# Patient Record
Sex: Male | Born: 1940 | Race: White | Hispanic: No | Marital: Married | Smoking: Never smoker
Health system: Southern US, Community
[De-identification: ages and names within clinical notes are randomized; demographics above are authoritative.]

## PROBLEM LIST (undated history)

## (undated) DIAGNOSIS — N183 Chronic kidney disease, stage 3 unspecified: Secondary | ICD-10-CM

## (undated) DIAGNOSIS — I255 Ischemic cardiomyopathy: Secondary | ICD-10-CM

## (undated) DIAGNOSIS — I739 Peripheral vascular disease, unspecified: Secondary | ICD-10-CM

## (undated) DIAGNOSIS — I2510 Atherosclerotic heart disease of native coronary artery without angina pectoris: Secondary | ICD-10-CM

## (undated) DIAGNOSIS — Z8719 Personal history of other diseases of the digestive system: Secondary | ICD-10-CM

## (undated) DIAGNOSIS — I219 Acute myocardial infarction, unspecified: Secondary | ICD-10-CM

## (undated) DIAGNOSIS — I10 Essential (primary) hypertension: Secondary | ICD-10-CM

## (undated) DIAGNOSIS — I779 Disorder of arteries and arterioles, unspecified: Secondary | ICD-10-CM

## (undated) DIAGNOSIS — K2210 Ulcer of esophagus without bleeding: Secondary | ICD-10-CM

## (undated) DIAGNOSIS — Z9889 Other specified postprocedural states: Secondary | ICD-10-CM

## (undated) DIAGNOSIS — I5022 Chronic systolic (congestive) heart failure: Secondary | ICD-10-CM

## (undated) DIAGNOSIS — R112 Nausea with vomiting, unspecified: Secondary | ICD-10-CM

## (undated) DIAGNOSIS — D638 Anemia in other chronic diseases classified elsewhere: Secondary | ICD-10-CM

## (undated) DIAGNOSIS — E785 Hyperlipidemia, unspecified: Secondary | ICD-10-CM

## (undated) DIAGNOSIS — M1990 Unspecified osteoarthritis, unspecified site: Secondary | ICD-10-CM

## (undated) DIAGNOSIS — IMO0001 Reserved for inherently not codable concepts without codable children: Secondary | ICD-10-CM

## (undated) DIAGNOSIS — I701 Atherosclerosis of renal artery: Secondary | ICD-10-CM

## (undated) DIAGNOSIS — I4891 Unspecified atrial fibrillation: Secondary | ICD-10-CM

## (undated) DIAGNOSIS — G4730 Sleep apnea, unspecified: Secondary | ICD-10-CM

## (undated) DIAGNOSIS — J189 Pneumonia, unspecified organism: Secondary | ICD-10-CM

## (undated) DIAGNOSIS — L0390 Cellulitis, unspecified: Secondary | ICD-10-CM

## (undated) HISTORY — DX: Atherosclerotic heart disease of native coronary artery without angina pectoris: I25.10

## (undated) HISTORY — DX: Chronic systolic (congestive) heart failure: I50.22

## (undated) HISTORY — DX: Hyperlipidemia, unspecified: E78.5

## (undated) HISTORY — DX: Ulcer of esophagus without bleeding: K22.10

## (undated) HISTORY — DX: Unspecified atrial fibrillation: I48.91

## (undated) HISTORY — DX: Peripheral vascular disease, unspecified: I73.9

## (undated) HISTORY — PX: OTHER SURGICAL HISTORY: SHX169

## (undated) HISTORY — DX: Disorder of arteries and arterioles, unspecified: I77.9

## (undated) HISTORY — DX: Cellulitis, unspecified: L03.90

## (undated) HISTORY — DX: Chronic kidney disease, stage 3 (moderate): N18.3

## (undated) HISTORY — DX: Anemia in other chronic diseases classified elsewhere: D63.8

## (undated) HISTORY — DX: Chronic kidney disease, stage 3 unspecified: N18.30

## (undated) HISTORY — DX: Ischemic cardiomyopathy: I25.5

## (undated) HISTORY — PX: URETHRAL DILATION: SUR417

## (undated) HISTORY — DX: Atherosclerosis of renal artery: I70.1

---
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
## 1997-01-26 HISTORY — PX: CORONARY ARTERY BYPASS GRAFT: SHX141

## 1998-03-15 ENCOUNTER — Ambulatory Visit (HOSPITAL_COMMUNITY): Admission: RE | Admit: 1998-03-15 | Discharge: 1998-03-15 | Payer: Self-pay | Admitting: Gastroenterology

## 1999-06-01 ENCOUNTER — Ambulatory Visit (HOSPITAL_COMMUNITY): Admission: RE | Admit: 1999-06-01 | Discharge: 1999-06-01 | Payer: Self-pay | Admitting: Interventional Cardiology

## 1999-06-01 ENCOUNTER — Encounter: Payer: Self-pay | Admitting: Interventional Cardiology

## 2000-02-16 ENCOUNTER — Encounter: Payer: Self-pay | Admitting: Family Medicine

## 2000-02-16 ENCOUNTER — Encounter: Admission: RE | Admit: 2000-02-16 | Discharge: 2000-02-16 | Payer: Self-pay | Admitting: Family Medicine

## 2000-07-16 ENCOUNTER — Encounter: Admission: RE | Admit: 2000-07-16 | Discharge: 2000-08-15 | Payer: Self-pay | Admitting: Neurosurgery

## 2001-05-28 HISTORY — PX: ARTERIAL THROMBECTOMY: SHX558

## 2001-06-28 DIAGNOSIS — I219 Acute myocardial infarction, unspecified: Secondary | ICD-10-CM

## 2001-06-28 HISTORY — DX: Acute myocardial infarction, unspecified: I21.9

## 2001-07-09 ENCOUNTER — Encounter: Payer: Self-pay | Admitting: Interventional Cardiology

## 2001-07-09 ENCOUNTER — Inpatient Hospital Stay (HOSPITAL_COMMUNITY): Admission: EM | Admit: 2001-07-09 | Discharge: 2001-07-13 | Payer: Self-pay | Admitting: *Deleted

## 2001-07-09 ENCOUNTER — Encounter: Admission: RE | Admit: 2001-07-09 | Discharge: 2001-07-09 | Payer: Self-pay | Admitting: Interventional Cardiology

## 2001-07-29 ENCOUNTER — Encounter (HOSPITAL_COMMUNITY): Admission: RE | Admit: 2001-07-29 | Discharge: 2001-10-27 | Payer: Self-pay | Admitting: Interventional Cardiology

## 2001-10-28 ENCOUNTER — Inpatient Hospital Stay (HOSPITAL_COMMUNITY): Admission: EM | Admit: 2001-10-28 | Discharge: 2001-10-30 | Payer: Self-pay | Admitting: *Deleted

## 2002-07-26 ENCOUNTER — Encounter: Payer: Self-pay | Admitting: Family Medicine

## 2002-07-26 ENCOUNTER — Encounter: Admission: RE | Admit: 2002-07-26 | Discharge: 2002-07-26 | Payer: Self-pay | Admitting: Family Medicine

## 2004-12-05 ENCOUNTER — Encounter: Admission: RE | Admit: 2004-12-05 | Discharge: 2004-12-05 | Payer: Self-pay | Admitting: Nephrology

## 2005-04-25 ENCOUNTER — Encounter: Admission: RE | Admit: 2005-04-25 | Discharge: 2005-04-25 | Payer: Self-pay | Admitting: Nephrology

## 2005-06-08 ENCOUNTER — Ambulatory Visit (HOSPITAL_COMMUNITY): Admission: RE | Admit: 2005-06-08 | Discharge: 2005-06-08 | Payer: Self-pay | Admitting: Interventional Radiology

## 2005-06-19 ENCOUNTER — Encounter: Admission: RE | Admit: 2005-06-19 | Discharge: 2005-06-19 | Payer: Self-pay | Admitting: Interventional Radiology

## 2006-10-01 ENCOUNTER — Inpatient Hospital Stay (HOSPITAL_BASED_OUTPATIENT_CLINIC_OR_DEPARTMENT_OTHER): Admission: RE | Admit: 2006-10-01 | Discharge: 2006-10-01 | Payer: Self-pay | Admitting: Interventional Cardiology

## 2009-01-17 ENCOUNTER — Encounter: Admission: RE | Admit: 2009-01-17 | Discharge: 2009-01-17 | Payer: Self-pay | Admitting: Interventional Cardiology

## 2009-01-18 ENCOUNTER — Ambulatory Visit (HOSPITAL_COMMUNITY): Admission: RE | Admit: 2009-01-18 | Discharge: 2009-01-19 | Payer: Self-pay | Admitting: Interventional Cardiology

## 2010-01-13 ENCOUNTER — Encounter: Admission: RE | Admit: 2010-01-13 | Discharge: 2010-01-13 | Payer: Self-pay | Admitting: Family Medicine

## 2010-01-23 ENCOUNTER — Ambulatory Visit (HOSPITAL_COMMUNITY): Admission: RE | Admit: 2010-01-23 | Discharge: 2010-01-23 | Payer: Self-pay | Admitting: Family Medicine

## 2010-01-23 ENCOUNTER — Encounter: Payer: Self-pay | Admitting: Pulmonary Disease

## 2010-02-16 DIAGNOSIS — I10 Essential (primary) hypertension: Secondary | ICD-10-CM | POA: Insufficient documentation

## 2010-02-16 DIAGNOSIS — K2270 Barrett's esophagus without dysplasia: Secondary | ICD-10-CM | POA: Insufficient documentation

## 2010-02-16 DIAGNOSIS — M545 Low back pain: Secondary | ICD-10-CM

## 2010-02-16 DIAGNOSIS — I25110 Atherosclerotic heart disease of native coronary artery with unstable angina pectoris: Secondary | ICD-10-CM

## 2010-02-16 DIAGNOSIS — R0602 Shortness of breath: Secondary | ICD-10-CM | POA: Insufficient documentation

## 2010-02-16 DIAGNOSIS — E785 Hyperlipidemia, unspecified: Secondary | ICD-10-CM

## 2010-02-16 DIAGNOSIS — G609 Hereditary and idiopathic neuropathy, unspecified: Secondary | ICD-10-CM | POA: Insufficient documentation

## 2010-02-17 ENCOUNTER — Ambulatory Visit: Payer: Self-pay | Admitting: Pulmonary Disease

## 2010-02-20 ENCOUNTER — Ambulatory Visit (HOSPITAL_COMMUNITY)
Admission: RE | Admit: 2010-02-20 | Discharge: 2010-02-20 | Payer: Self-pay | Source: Home / Self Care | Admitting: Pulmonary Disease

## 2010-02-21 ENCOUNTER — Telehealth (INDEPENDENT_AMBULATORY_CARE_PROVIDER_SITE_OTHER): Payer: Self-pay | Admitting: *Deleted

## 2010-02-24 ENCOUNTER — Inpatient Hospital Stay (HOSPITAL_COMMUNITY)
Admission: EM | Admit: 2010-02-24 | Discharge: 2010-02-27 | Payer: Self-pay | Source: Home / Self Care | Admitting: Emergency Medicine

## 2010-02-24 ENCOUNTER — Ambulatory Visit: Payer: Self-pay | Admitting: Internal Medicine

## 2010-03-03 ENCOUNTER — Inpatient Hospital Stay (HOSPITAL_BASED_OUTPATIENT_CLINIC_OR_DEPARTMENT_OTHER): Admission: RE | Admit: 2010-03-03 | Discharge: 2010-03-03 | Payer: Self-pay | Admitting: Interventional Cardiology

## 2010-03-10 ENCOUNTER — Encounter: Admission: RE | Admit: 2010-03-10 | Discharge: 2010-03-10 | Payer: Self-pay | Admitting: Otolaryngology

## 2010-05-03 ENCOUNTER — Ambulatory Visit (HOSPITAL_COMMUNITY)
Admission: RE | Admit: 2010-05-03 | Discharge: 2010-05-03 | Payer: Self-pay | Source: Home / Self Care | Attending: Otolaryngology | Admitting: Otolaryngology

## 2010-06-29 NOTE — Assessment & Plan Note (Signed)
Summary: consult for episodic sob   Visit Type:  Initial Consult Copy to:  [facility] Primary Provider/Referring Provider:  Cam Hai  CC:  pulmonary consult.  History of Present Illness: The pt is a 69y/o male who I have been asked to see for episodic dyspnea.  The pt has had this issue for approx. 6-12 mos, but is unsure if it has been worsening.  He has intermittantly a sensation that he cannot get his breath, and has not been able to pinpoint any temporal relationship that may help explain.  It can occur at rest, with conversation, but does NOT occur with his vigorous exercise program.  He execises 3-5 times a week, and works hard on the treadmill without sob.  He states that he can walk miles outside without getting sob.  He describes these episodes as a feeling he cannot get his breath, but no smothering or choking.  He has a mild dry cough at times, but does not feel this is an issue.  He has no h/o childhood asthma, and denies pleuritic chest pain.  He does have a h/o CAD, and EF in 2010 was 45% with +WMA.  He has had recent pfts with no airflow obstruction, and lung volumes/dlco inaccurate due to air leaks.  He has not had a recent cxr.    Current Medications (verified): 1)  Doxazosin Mesylate 4 Mg Tabs (Doxazosin Mesylate) .Marland Kitchen.. 1 Tablet By Mouth Once Daily 2)  Metformin Hcl 1000 Mg Tabs (Metformin Hcl) .Marland Kitchen.. 1 Tablet Two Times A Day 3)  Lantus 100 Unit/ml Soln (Insulin Glargine) .... 26 Units Subq 4)  Crestor 5 Mg Tabs (Rosuvastatin Calcium) .Marland Kitchen.. 1 Tablet Once Daily 5)  Fish Oil 300 Mg Caps (Omega-3 Fatty Acids) .... Once Daily 6)  Niaspan 500 Mg Cr-Tabs (Niacin (Antihyperlipidemic)) .... Once Daily 7)  Cartia Xt 240 Mg Xr24h-Cap (Diltiazem Hcl Coated Beads) .... Once Daily 8)  Protonix 40 Mg Solr (Pantoprazole Sodium) .Marland Kitchen.. 1 Tab By Mouth 3 Times Per Week 9)  Ferrous Sulfate 325 (65 Fe) Mg Tabs (Ferrous Sulfate) .... Once Daily 10)  Tylenol Arthritis Pain 650 Mg Cr-Tabs  (Acetaminophen) .... 2 Tabs Two Times A Day As Needed 11)  Furosemide 20 Mg Tabs (Furosemide) .... Once Daily 12)  Effient 10 Mg Tabs (Prasugrel Hcl) .... Once Daily 13)  Xalatan 0.005 % Soln (Latanoprost) .Marland Kitchen.. 1 Drop Into Affected Eye Every Morning As Needed 14)  Optivar 0.05 % Soln (Azelastine Hcl) .Marland Kitchen.. 1 Drop Into Affected Eye As Needed 15)  Vitamin D 1000 Unit Tabs (Cholecalciferol) .Marland Kitchen.. 1 Tablet Once Daily 16)  Nitrostat 0.4 Mg Subl (Nitroglycerin) .... As Directed As Needed 17)  Aspirin 325 Mg Tabs (Aspirin) .Marland Kitchen.. 1 Tab Once Daily 18)  Lisinopril 20 Mg Tabs (Lisinopril) .Marland Kitchen.. 1 1/2 Tab Two Times A Day 19)  Nasacort Aq 55 Mcg/act Aers (Triamcinolone Acetonide) .... 2 Puffs in Each Nostrile As Needed 20)  Clobetasol Propionate 0.05 % Oint (Clobetasol Propionate) .Marland Kitchen.. 1 Application To The Affected Area As Needed 21)  Systane 0.4-0.3 % Soln (Polyethyl Glycol-Propyl Glycol) .Marland Kitchen.. 1 Drop Into Affected Eye As Needed  Allergies: 1)  ! Sulfa 2)  ! * Dye Contrast 3)  ! * Mercury 4)  ! Iodine 5)  ! * Plavix 6)  ! 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 MI--FATHER, BROTHER HTN--2 BROTHER DM--BROTHER ASTHMA--SISTER  Social History: Reviewed history from 02/16/2010 and no changes required. Patient never smoked.  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  The patient denies shortness of breath with activity, productive cough, coughing up blood, irregular heartbeats, indigestion, loss of appetite, weight change, abdominal pain, difficulty swallowing, sore throat, itching, ear ache, anxiety, depression, rash, change in color of mucus, and fever.    Vital Signs:  Patient profile:   70 year old male Height:      67 inches Weight:      157.25 pounds BMI:     24.72 O2 Sat:      98 % on Room air Temp:     98.2 degrees F oral Pulse rate:   70 / minute BP sitting:   136 / 68  (left arm) Cuff size:   regular  Vitals Entered By: Carver Fila (February 17, 2010 9:21 AM)  O2 Flow:  Room air CC: pulmonary consult Comments meds and allergies updated Phone number updated Mindy Silva  February 17, 2010 9:22 AM    Physical Exam  General:  wd male in nad  Eyes:  PERRLA and EOMI.   Nose:  patent without discharge, no purulence noted. Mouth:  clear, no exudates. Neck:  no jvd, tmg, LN Lungs:  totally clear to auscultation. Heart:  rrr, 2/6 sem Abdomen:  soft and nontender, bs+ Extremities:  1+ edema noted in ankles, no cyanosis  pulses intact distally Neurologic:  alert and oriented, moves all 4.   Impression & Recommendations:  Problem # 1:  DYSPNEA (ICD-786.05) the pt has atypical dyspnea that occurs at rest, does not bother him with significant physical exertion, and has no temporal relationship to anytihing that he can pinpoint.  His pfts show no obstruction, and his FV loop appears normal.  His lung volumes and DLCO are uninterpretable due to large leak...the numbers are way too high, and outside the range of accuracy.  His lungs are totally clear, and he has no history  that is suggestive of asthma.  I am unable to find a specific pulmonary cause for his episodic symptoms, but would like to do a regular cxr and v/q scan to exclude possible chronic thromboembolic disease.  If these are unremarkable, I suspect this is either due to his "perception of breathing" or possibly due to his underlying cardiac disease.  If he continues to have this issue over time, or if worsens, I would do cardiopulmonary exercise testing to see if this would clear up the etiology for his symptoms.  My only other thought here is whether this could have anything to do with his ACE.  He denies any choking or feeling like his airway closes up, but a trial off the drug for 4-8 weeks could be tried.    Medications Added to Medication List This Visit: 1)  Doxazosin Mesylate 4 Mg Tabs (Doxazosin mesylate) .Marland Kitchen.. 1 tablet by mouth once daily 2)  Metformin Hcl 1000 Mg Tabs (  Metformin hcl) .Marland Kitchen.. 1 tablet two times a day 3)  Lantus 100 Unit/ml Soln (Insulin glargine) .... 26 units subq 4)  Crestor 5 Mg Tabs (Rosuvastatin calcium) .Marland Kitchen.. 1 tablet once daily 5)  Fish Oil 300 Mg Caps (Omega-3 fatty acids) .... Once daily 6)  Niaspan 500 Mg Cr-tabs (Niacin (antihyperlipidemic)) .... Once daily 7)  Cartia Xt 240 Mg Xr24h-cap (Diltiazem hcl coated beads) .... Once daily 8)  Protonix 40 Mg Solr (Pantoprazole sodium) .Marland Kitchen.. 1 tab by mouth 3 times per week 9)  Ferrous Sulfate 325 (65 Fe) Mg Tabs (Ferrous sulfate) .... Once daily 10)  Tylenol Arthritis Pain 650 Mg Cr-tabs (Acetaminophen) .... 2 tabs two times a day as needed 11)  Furosemide 20 Mg Tabs (Furosemide) .... Once daily 12)  Effient 10 Mg Tabs (Prasugrel hcl) .... Once daily 13)  Xalatan 0.005 % Soln (Latanoprost) .Marland Kitchen.. 1 drop into affected eye every morning as needed 14)  Optivar 0.05 % Soln (Azelastine hcl) .Marland Kitchen.. 1 drop into affected eye as needed 15)  Vitamin D 1000 Unit Tabs (Cholecalciferol) .Marland Kitchen.. 1 tablet once daily 16)  Nitrostat 0.4 Mg Subl  (Nitroglycerin) .... As directed as needed 17)  Aspirin 325 Mg Tabs (Aspirin) .Marland Kitchen.. 1 tab once daily 18)  Lisinopril 20 Mg Tabs (Lisinopril) .Marland Kitchen.. 1 1/2 tab two times a day 19)  Nasacort Aq 55 Mcg/act Aers (Triamcinolone acetonide) .... 2 puffs in each nostrile as needed 20)  Clobetasol Propionate 0.05 % Oint (Clobetasol propionate) .Marland Kitchen.. 1 application to the affected area as needed 21)  Systane 0.4-0.3 % Soln (Polyethyl glycol-propyl glycol) .Marland Kitchen.. 1 drop into affected eye as needed  Other Orders: Consultation Level IV (16109) Radiology Referral (Radiology) T-2 View CXR (71020TC)  Patient Instructions: 1)  will check cxr today for completeness 2)  will schedule for lung scan to exclude small blood clots. 3)  if you continue to have issues over time, and especially if it worsens, please let me know.   Immunization History:  Influenza Immunization History:    Influenza:  historical (02/25/2009)

## 2010-06-29 NOTE — Progress Notes (Signed)
Summary: results of lung scan  Phone Note Call from Patient   Caller: Patient Call For: clance Summary of Call: calling for lab results Initial call taken by: Rickard Patience,  February 21, 2010 1:08 PM  Follow-up for Phone Call        called and spoke with pt. pt aware of lung scan.  see append to lung scan results for complete details.  Aundra Millet Reynolds LPN  February 21, 2010 1:51 PM

## 2010-08-07 LAB — BASIC METABOLIC PANEL
BUN: 17 mg/dL (ref 6–23)
CO2: 28 mEq/L (ref 19–32)
Calcium: 9.5 mg/dL (ref 8.4–10.5)
Chloride: 102 mEq/L (ref 96–112)
Creatinine, Ser: 1.4 mg/dL (ref 0.4–1.5)
GFR calc Af Amer: >60 mL/min (ref >60)
GFR calc non Af Amer: 51 mL/min — ABNORMAL LOW (ref >60)
Glucose, Bld: 290 mg/dL — ABNORMAL HIGH (ref 70–99)
Potassium: 4.2 mEq/L (ref 3.5–5.1)
Sodium: 137 mEq/L (ref 135–145)

## 2010-08-07 LAB — CBC
HCT: 37 % — ABNORMAL LOW (ref 39.0–52.0)
Hemoglobin: 11.9 g/dL — ABNORMAL LOW (ref 13.0–17.0)
MCH: 30 pg (ref 26.0–34.0)
MCHC: 32.2 g/dL (ref 30.0–36.0)
MCV: 93.2 fL (ref 78.0–100.0)
Platelets: 137 10*3/uL — ABNORMAL LOW (ref 150–400)
RBC: 3.97 MIL/uL — ABNORMAL LOW (ref 4.22–5.81)
RDW: 13.5 % (ref 11.5–15.5)
WBC: 3.7 10*3/uL — ABNORMAL LOW (ref 4.0–10.5)

## 2010-08-07 LAB — SURGICAL PCR SCREEN
MRSA, PCR: NEGATIVE
Staphylococcus aureus: NEGATIVE

## 2010-08-07 LAB — GLUCOSE, CAPILLARY
Glucose-Capillary: 109 mg/dL — ABNORMAL HIGH (ref 70–99)
Glucose-Capillary: 90 mg/dL (ref 70–99)

## 2010-08-07 LAB — PROTIME-INR
INR: 1.02 (ref 0.00–1.49)
Prothrombin Time: 13.6 seconds (ref 11.6–15.2)

## 2010-08-09 LAB — GLUCOSE, CAPILLARY
Glucose-Capillary: 111 mg/dL — ABNORMAL HIGH (ref 70–99)
Glucose-Capillary: 116 mg/dL — ABNORMAL HIGH (ref 70–99)
Glucose-Capillary: 121 mg/dL — ABNORMAL HIGH (ref 70–99)
Glucose-Capillary: 147 mg/dL — ABNORMAL HIGH (ref 70–99)
Glucose-Capillary: 148 mg/dL — ABNORMAL HIGH (ref 70–99)
Glucose-Capillary: 165 mg/dL — ABNORMAL HIGH (ref 70–99)
Glucose-Capillary: 167 mg/dL — ABNORMAL HIGH (ref 70–99)
Glucose-Capillary: 213 mg/dL — ABNORMAL HIGH (ref 70–99)
Glucose-Capillary: 216 mg/dL — ABNORMAL HIGH (ref 70–99)
Glucose-Capillary: 91 mg/dL (ref 70–99)

## 2010-08-09 LAB — TROPONIN I: Troponin I: 0.04 ng/mL (ref 0.00–0.06)

## 2010-08-09 LAB — CBC
HCT: 34.1 % — ABNORMAL LOW (ref 39.0–52.0)
HCT: 35.7 % — ABNORMAL LOW (ref 39.0–52.0)
Hemoglobin: 11.1 g/dL — ABNORMAL LOW (ref 13.0–17.0)
Hemoglobin: 11.8 g/dL — ABNORMAL LOW (ref 13.0–17.0)
MCH: 30.3 pg (ref 26.0–34.0)
MCH: 30.9 pg (ref 26.0–34.0)
MCHC: 32.6 g/dL (ref 30.0–36.0)
MCHC: 33.1 g/dL (ref 30.0–36.0)
MCV: 93.2 fL (ref 78.0–100.0)
MCV: 93.5 fL (ref 78.0–100.0)
Platelets: 105 K/uL — ABNORMAL LOW (ref 150–400)
Platelets: 114 K/uL — ABNORMAL LOW (ref 150–400)
RBC: 3.66 MIL/uL — ABNORMAL LOW (ref 4.22–5.81)
RBC: 3.82 MIL/uL — ABNORMAL LOW (ref 4.22–5.81)
RDW: 12.9 % (ref 11.5–15.5)
RDW: 12.9 % (ref 11.5–15.5)
WBC: 4.5 K/uL (ref 4.0–10.5)
WBC: 5 K/uL (ref 4.0–10.5)

## 2010-08-09 LAB — CK TOTAL AND CKMB (NOT AT ARMC)
CK, MB: 4.3 ng/mL — ABNORMAL HIGH (ref 0.3–4.0)
Relative Index: 2.8 — ABNORMAL HIGH (ref 0.0–2.5)
Total CK: 151 U/L (ref 7–232)

## 2010-08-09 LAB — POCT CARDIAC MARKERS
CKMB, poc: 1.4 ng/mL (ref 1.0–8.0)
Myoglobin, poc: 176 ng/mL (ref 12–200)
Troponin i, poc: <0.05 ng/mL (ref 0.00–0.09)

## 2010-08-09 LAB — CARDIAC PANEL(CRET KIN+CKTOT+MB+TROPI)
CK, MB: 3.4 ng/mL (ref 0.3–4.0)
CK, MB: 3.6 ng/mL (ref 0.3–4.0)
Relative Index: 2.6 — ABNORMAL HIGH (ref 0.0–2.5)
Relative Index: 2.9 — ABNORMAL HIGH (ref 0.0–2.5)
Total CK: 123 U/L (ref 7–232)
Total CK: 129 U/L (ref 7–232)
Troponin I: 0.03 ng/mL (ref 0.00–0.06)
Troponin I: 0.06 ng/mL (ref 0.00–0.06)

## 2010-08-09 LAB — PROTIME-INR
INR: 0.96 (ref 0.00–1.49)
Prothrombin Time: 13 seconds (ref 11.6–15.2)

## 2010-08-09 LAB — HEPARIN LEVEL (UNFRACTIONATED): Heparin Unfractionated: 0.68 IU/mL (ref 0.30–0.70)

## 2010-08-09 LAB — POCT I-STAT GLUCOSE
Glucose, Bld: 250 mg/dL — ABNORMAL HIGH (ref 70–99)
Operator id: 141321

## 2010-08-10 LAB — CBC
MCH: 30.4 pg (ref 26.0–34.0)
MCHC: 32.5 g/dL (ref 30.0–36.0)
Platelets: 139 10*3/uL — ABNORMAL LOW (ref 150–400)
RDW: 12.9 % (ref 11.5–15.5)

## 2010-08-10 LAB — POCT CARDIAC MARKERS: Myoglobin, poc: 173 ng/mL (ref 12–200)

## 2010-08-10 LAB — BASIC METABOLIC PANEL
BUN: 18 mg/dL (ref 6–23)
CO2: 28 mEq/L (ref 19–32)
Calcium: 9.9 mg/dL (ref 8.4–10.5)
Creatinine, Ser: 1.56 mg/dL — ABNORMAL HIGH (ref 0.4–1.5)
GFR calc non Af Amer: 45 mL/min — ABNORMAL LOW (ref >60)
Glucose, Bld: 163 mg/dL — ABNORMAL HIGH (ref 70–99)
Sodium: 142 mEq/L (ref 135–145)

## 2010-09-02 LAB — CBC
MCHC: 34.2 g/dL (ref 30.0–36.0)
MCV: 92.9 fL (ref 78.0–100.0)
Platelets: 108 10*3/uL — ABNORMAL LOW (ref 150–400)
RBC: 3.45 MIL/uL — ABNORMAL LOW (ref 4.22–5.81)
WBC: 7.5 10*3/uL (ref 4.0–10.5)

## 2010-09-02 LAB — BASIC METABOLIC PANEL
BUN: 22 mg/dL (ref 6–23)
CO2: 26 mEq/L (ref 19–32)
Calcium: 8.9 mg/dL (ref 8.4–10.5)
Chloride: 103 mEq/L (ref 96–112)
Creatinine, Ser: 1.51 mg/dL — ABNORMAL HIGH (ref 0.4–1.5)
GFR calc Af Amer: 56 mL/min — ABNORMAL LOW (ref >60)

## 2010-09-02 LAB — GLUCOSE, CAPILLARY
Glucose-Capillary: 258 mg/dL — ABNORMAL HIGH (ref 70–99)
Glucose-Capillary: 324 mg/dL — ABNORMAL HIGH (ref 70–99)
Glucose-Capillary: 333 mg/dL — ABNORMAL HIGH (ref 70–99)

## 2010-10-10 NOTE — Cardiovascular Report (Signed)
NAME:  Ethan Vargas, Ethan Vargas NO.:  [id number]   MEDICAL RECORD NO.:  [id number]          PATIENT TYPE:  INP   LOCATION:  2506                         FACILITY:  [facility]   PHYSICIAN:  Lyn Records, M.D.   DATE OF BIRTH:  Jun 25, 1940   DATE OF PROCEDURE:  01/18/2009  DATE OF DISCHARGE:                            CARDIAC CATHETERIZATION   CARDIAC CATHETERIZATION REPORT AND PERCUTANEOUS CORONARY INTERVENTION   INDICATIONS:  The patient has a history of coronary bypass grafting in  1998.  Acute inferior infarction with stenting of the saphenous vein  graft to the right coronary in 2003.  Subsequent recent chest discomfort  with a nonischemic Cardiolite approximately 4 weeks ago.  Prolonged  episode of pain 72 hours ago dictating that coronary angiography should  be performed.   PROCEDURES PERFORMED:  1. Left heart cath.  2. Selective coronary angio.  3. Left ventriculography.  4. Bypass graft angiography.  5. Left internal mammary artery graft angiography.  6. Drug-eluting stents saphenous vein graft to the diagonal #1.   DESCRIPTION OF PROCEDURE:  The patient was brought to the diagnostic  laboratory in the [facility].  A 5-French sheath was placed using  the modified Seldinger technique after 1 mg of Versed and 25 mcg of  fentanyl.  A 4-French A2 multipurpose catheter was then used for  hemodynamic recordings, left ventriculography by hand injection,  selective right coronary angiography, and saphenous vein graft  angiography.  A #5-French left Judkins catheter was used for left  coronary angiography.  We used a 5-French internal mammary catheter for  left internal mammary angiography and right coronary angiography.  An  abdominal aortogram was performed using a pigtail catheter, 5-French.   After reviewing the coronary anatomy, it was felt that PCI on the  diagonal saphenous vein graft was indicated.  Because of the location of  the stenoses, distal protection  was not possible.  I chose not to  attempt proximal protection with Proxis.   After discussing the potential risks, we proceeded.  We upgraded to a 6-  [place] sheath.  The patient was given a 0.75 mg/kg bolus of Angiomax and  started on 1.75 mg/kg/hour infusion.  ACT was documented to be greater  than 300 seconds.  He was given 60 mg of Effient orally.  There is a  possible allergy to PLAVIX.  We then performed PCI using a #1 left  Amplatz guide catheter, 6-French.  An Office manager was used to  cross the stenoses in the saphenous vein graft.  We predilated with a 2-  0 balloon the distal lesion in the saphenous vein graft.  We deployed an  18 mm long x 3.0 mm XIENCE stent and postdilated to 10 atmospheres.  Two  balloon inflations were performed.  We then direct stented the midbody  stenosis with a 3.0 x 12 mm XIENCE stent to 12 atmospheres.  We then  postdilated the distal stent with a 3.0 x 12 mm Glidden Quantum to 16  atmospheres.  We also postdilated the distal stent with a 12 mm long x  3.0 mm Moody  Quantum to 13 atmospheres.  We then used a 3.25 Wynne apex to 13  atmospheres in the midbody stent.  One inflation was performed.   After completion of dilatation, 100 mcg of verapamil and 200 mcg of  nitroglycerin was given.  Good flow was noted, 0% stenoses of both sites  were noted.   The arterial sheath was injected and was documented to be in good  position to allow Angio-Seal.  Angio-Seal was performed with good  hemostasis.   RESULTS:  1. Hemodynamic data:  A,  Aortic pressure 157/69.  B.  Left ventricular pressure 156/27.  1. Left ventriculography:  The inferior wall is severely      hypokinetic/akinetic.  EF is 45%.  2. Coronary angiography.      a.     Left main coronary:  Patent.      b.     Left anterior descending coronary:  Totally occluded after       the first septal perforator in the midvessel.      c.     Circumflex artery:  The major obtuse marginal is totally        occluded.  Left atrial recurrent branch and distal marginal       branches supply collaterals to the distal right.      d.     Right coronary:  Totally occluded proximally.  3. Bypass graft angiography.      a.     Saphenous vein graft to the PDA:  Totally occluded       proximally.      b.     Saphenous vein graft to the diagonal:  This graft contains a       95% to 99% stenosis in the distal body of the graft and there is       an eccentric relatively focal 90% midbody stenosis.      c.     Saphenous vein graft to the obtuse marginal:  Widely patent.  4. LIMA graft to the LAD:  Widely patent.  5. PCI:      a.     90% midbody stenosis reduced to 0% after stenting as       described above with XIENCE 3.0 postdilated to 3.25.      b.     Distal 99% stenosis reduced to 0% stenosis with a 3.0 x 18       XIENCE stent postdilated to 3.0 at 14 atmospheres.      c.     TIMI grade 3 flow was noted poststenting.   CONCLUSION:  1. Bypass graft failure with total occlusion of saphenous vein graft      to the right coronary, which is now collateralized from the left      territory and also from the saphenous vein graft to the circumflex.      There is also high-grade multi focal stenosis in the saphenous vein      graft to the diagonal.  2. Patent left internal mammary artery to the left anterior      descending.  3. Severe native vessel disease with total occlusion of the left      anterior descending, obtuse marginal #1, and right coronary.  4. Left ventricular dysfunction with inferior wall severe      hypokinesis/akinesis.  Ejection fraction 45%.  5. Successful stenting of the mid-body and distal saphenous vein graft      to the diagonal with drug-eluting XIENCE stents to 0%  stenosis and      TIMI grade 3 flow.   PLAN:  Aspirin and Effient.  Hopeful, discharge on January 19, 2009.     Lyn Records, M.D.  Electronically Signed    HWS/MEDQ  D:  01/18/2009  T:  01/18/2009  Job:   [id number]   cc:   Donia Guiles, M.D.

## 2010-10-10 NOTE — Discharge Summary (Signed)
NAME:  Ethan Vargas, Ethan Vargas NO.:  [id number]   MEDICAL RECORD NO.:  [id number]          PATIENT TYPE:  INP   LOCATION:  2506                         FACILITY:  [facility]   PHYSICIAN:  Lyn Records, M.D.   DATE OF BIRTH:  Jul 19, 1940   DATE OF ADMISSION:  01/18/2009  DATE OF DISCHARGE:  01/19/2009                               DISCHARGE SUMMARY   DISCHARGE DIAGNOSES:  1. Coronary artery disease with bypass graft failure.      a.     Drug-eluting stent implantation, saphenous vein graft to the       diagonal  2. Diabetes mellitus with transient poor blood glucose control due to      premedication with prednisone for CONTRAST allergy.  3. Hypertension.  4. Hyperlipidemia.   PROCEDURE PERFORMED:  Implantation of drug-eluting stents in the  saphenous vein graft to the diagonal on January 18, 2009.   DISCHARGE INSTRUCTIONS:  The patient is to refrain from heavy physical  activity for 4 days.  He may return to work on January 24, 2009.   MEDICATIONS AT DISCHARGE:  1. Protonix 40 mg per day.  2. Diltiazem XT 240 mg per day.  3. Aspirin coated 325 mg per day.  4. Niaspan SR 500 mg per day.  5. Crestor 5 mg daily.  6. Doxazosin 2 mg daily.  7. Lisinopril 30 mg b.i.d.  8. Furosemide 20 mg daily.  9. Effient 10 mg daily.  10.Nitroglycerin 0.4 mg sublingually p.r.n. chest pain.  11.The patient is to resume metformin/glyburide (500/2.5 mg) 2 tablets      twice daily on January 20, 2009.   CONDITION ON DISCHARGE:  Improved.  Office visit with Dr. Verdis Prime in  7-10 days.   HISTORY AND HOSPITAL COURSE:  The patient is 74.  He recently had a  Cardiolite study in response to recent chest pain.  A Cardiolite study  demonstrated a large region of diminished blood flow in the inferior  wall, but no noticeable ischemia.  Initial attempts at medical therapy  failed when 3 days prior to admission, he developed a prolonged episode  of chest discomfort requiring nitroglycerin for  relief.   He was admitted as an a.m. admission for catheterization and documented  to have total occlusion of saphenous vein graft to the right coronary  collateralized from the left coronary system and high-grade tandem  lesions in the saphenous vein graft to the diagonal.  These lesions were  successfully stented with a XIENCE drug-eluting stents without  complications.   The patient's creatinine on discharge is 1.6, and he is advised to hold  metformin for an additional 24-48 hours before resuming.  His condition  on discharge is improved.      Lyn Records, M.D.  Electronically Signed     HWS/MEDQ  D:  01/19/2009  T:  01/19/2009  Job:  [id number]   cc:   Donia Guiles, M.D.

## 2010-10-13 NOTE — Cardiovascular Report (Signed)
[place]  Patient:    Ethan Vargas, Ethan Vargas Visit Number: [id number] MRN: [id number]          Service Type: MED Location: CCUA 2923 01 Attending Physician:  Lyn Records. Iii Dictated by:   Darci Needle, M.D. Proc. Date: 07/10/01 Admit Date:  07/09/2001   CC:         Desma Maxim, M.D.  Cardiac Catheterization Lab  [facility]   Cardiac Catheterization  INDICATION:  Non-ST elevation myocardial infarction greater than 24 hours old. The patient has a history of coronary artery bypass grafting in 1998.  This study is being done to define anatomy and help guide therapy.  PROCEDURES PERFORMED: 1. Left heart catheterization. 2. Selective coronary angioplasty. 3. Left ventriculography. 4. Saphenous vein graft angiography. 5. Left internal mammary artery graft angiography. 6. Percutaneous coronary intervention on the saphenous vein graft to the    right coronary artery.  CARDIOLOGIST:  Darci Needle, M.D.  DESCRIPTION:  After informed consent, a 6-French sheath was placed in the right femoral artery using the modified Seldinger technique.  A 6-French A2 multipurpose catheter was used for hemodynamic recordings, left ventriculography, and selective right coronary angiography, saphenous vein graft angiography.  A #4 left Judkins 6-French catheter was used for left coronary angiography.  An internal mammary artery 6-French catheter was used for left internal mammary artery graft angiography.  After reviewing the cine angiograms, it was felt that, with distal collaterals to the right coronary territory, attempt should be made at recanalization of the saphenous vein graft to the right coronary.  We also felt that, since thrombus was visible in the proximal stent, that distal protection was essential.  We enrolled the patient in the registry for the filter wire.  We upgraded to a 7-French system and used a 7-French A1 side hold  guide catheter.  We were unable to cross the stenosis initially with BMW wire.  We then used a Cross-It 200 wire.  We were able to obtain distal wire position. We were unable to establish antegrade flow until after predilating with the 2 mm x 20 mm long Maverick balloon.  Two balloon inflations were performed. We established TIMI-2 flow.  We did make the determination that it would be feasible to place a filter wire distally.  With some difficulty, we were able to place a filter wire distally beyond what appeared to be the diseased segment.  After the filter wire was placed, distal perfusion and visualization was lost.  We then performed angioplasty using a 3.0 mm diameter, 20 mm long Maverick balloon.  This helped to reestablish TIMI-2 flow.  We noted that we had good distal filter wire basket position.  We then deployed a 3.5 mm diameter, 38 mm long Zeta stent distally and, in an overlapping fashion, a 23 mm long 3.5 mm diameter Zeta stent more proximally.  After stent deployment, there was no visualization.  We performed aspiration using a tracker catheter.  This did not recover much plaque.  With TIMI-0 flow even after attempting to aspirate, we decided to retrieve the basket, hoping that absence of flow was related to clot-laden basket.  Following removal of the basket/filter wire, TIMI-2-3 flow was reestablished; however, the very first injection was noted to be associated with movement of a very large thrombus down through the stented region and into the distal vessel.  This was not associated with chest discomfort or hemodynamic perturbation.  We then placed a wire distally  and attempted, using a transport catheter, to aspirate thrombus distally in the mid vessel and also proximally.  The following angiographic result was less than optimal with a mid intervention region of narrowing of 50% that is felt to be related to persistent thrombus. There was thrombus proximal to the stent  and also thrombus in the distal vessel beyond the stent deployment site.  The patient received a total of 3500 units of heparin during the procedure. He was given a double bolus followed by an infusion of Integrilin.  Initial ACT was noted to be 224 seconds.  An ACT during the procedure had dipped down as low as 170 seconds.  He received supplemental heparin at that time.  He was given an additional 1500 units of heparin at the completion of the procedure when the ACT was noted to be 189 seconds.  The patient did not experience hemodynamic disturbance during the procedure, although he did experience chest discomfort in the very beginning of the procedure when we were attempting to get our initial wire across the lesion. This was also associated with A-V block requiring IV atropine.  After reestablishing a heart rate in the 80 to 100 range, the patients chest discomfort resolved, and he was stable throughout the remainder of the procedure.  RESULTS: I.   Hemodynamic data:      a. Aortic pressure 140/78 mmHg.      b. Left ventricular pressure 140/15 mmHg. II.  Left ventriculography:  The inferobasal wall is akinetic.  EF is 35      to 45%, no MR. III. Selective coronary angiography.      a. Left main coronary:  Left main coronary artery contains a 30 to 40%         proximal stenosis.      b. Left anterior descending coronary: The LAD is totally occluded up to a         large first septal perforator.  There is 95% stenosis proximal to the         septal perforators.      c. Circumflex artery: The circumflex artery is patent.  Two large obtuse         marginal branches, however, are totally occluded and fill by         saphenous vein grafts.      d. Right coronary artery: Totally occluded in the mid vessel and         diffusely diseased throughout the entire proximal segment.  IV.  Bypass graft angiography.      a. Saphenous vein graft to the right coronary artery: Totally          occluded in the mid vessel with visible thrombus near the site of         total occlusion.      b. Saphenous vein graft sequential to the distal circumflex and obtuse         marginal: Widely patent with only minimal irregularities.      c. Saphenous vein graft to the diagonal: Widely patent.  Distal luminal         irregularities with up to 30 to 40% narrowing.  The diagonal distal to         the graft insertion site contains 70% stenosis. V.   LIMA to the LAD: Widely patent. VI.  Percutaneous coronary intervention: This was a very long and complex      procedure lasting longer than 3-1/2 hours. Were were able to reestablish  antegrade flow; however, there was evidence of distal embolization and      also suboptimal angiographic result within the deployed stents and also      thrombus noted proximal to the deployed stent region.  I decided to      terminate the case after significant contrast load that is estimated to      be 550 cc of contrast.  The patient was stable at the completion of the      procedure with no chest discomfort or obvious ischemic EKG changes.  ASSESSMENT: 1. 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  The distal bed is perfused by    collaterals. 2. Recanalization of saphenous vein graft to the right coronary with    suboptimal angiographic final result with evidence of distal thrombus    embolization as well as thrombus within the stented region and proximally.    Outlook for long-term patency in this vessel is felt to be very low. 3. Significant native vessel disease with total occlusion of the right    coronary artery in the mid vessel, total occlusion of the left anterior    descending artery, total occlusion of two obtuse marginal branches. 4. Saphenous vein graft to the diagonal, to the circumflex/obtuse marginal,    and the left internal mammary artery to the left anterior descending  artery    are widely patent.  There is some distal disease in the saphenous vein    graft to the diagonal. 5. Left ventricular dysfunction with inferior akinesis.  PLAN: 1. IV Integrilin. 2. IV nitroglycerin. 3. Watch for arrhythmias. 4. Sheath removal later today. 5. Medical management. Dictated by:   Darci Needle, M.D. Attending Physician:  Lyn Records. Iii DD:  07/10/01 TD:  07/10/01 Job: [id number]

## 2010-10-13 NOTE — H&P (Signed)
[place]. [facility]  Patient:    Ethan Vargas, Ethan Vargas Visit Number: [id number] MRN: [id number]          Service Type: MED Location: 2000 2039 01 Attending Physician:  Lyn Records. Iii Dictated by:   Anselm Lis, N.P. Admit Date:  07/09/2001 Discharge Date: 07/13/2001   CC:         Desma Maxim, M.D.   History and Physical  DATE OF BIRTH: May 05, 1941  PRIMARY CARE Chrishaun Sasso: Desma Maxim, M.D.  ADMISSION DIAGNOSES:  1. Coronary atherosclerotic heart disease.     a. Status post recent prehospital myocardial infarction (probably        inferior by location of electrocardiogram changes) in this        70 year old male who is status post coronary artery bypass grafting        x5 five years earlier.  He is currently pain-free.     b. September 1998, coronary artery bypass grafting x5 with left        internal mammary artery to the left anterior descending, saphenous vein        graft to the diagonal, sequential saphenous vein graft to second obtuse        marginal, and distal circumflex.  Prior cardiac catheterization        revealed ejection fraction of 55% with inferior wall hypokinesis,        negative mitral regurgitation.     c. January 2003, graded exercise test in office which was normal.  2. History of hypertension for greater than 20 years.     a. January 2001 he had an MRA revealing 60% right renal artery stenosis,        left okay.  3. History of hiatal hernia/gastroesophageal reflux disease.  4. Diabetes mellitus diagnosed eight years earlier.  5. History of psoriatic arthritis.  6. Hypercholesterolemia.  7. Right knee surgery.  8. History of urethral stricture.  9. Glaucoma. 10. DYE allergy.  PLAN:  1. Admit to [employer] on telemetry.  2. IV heparin, IV nitrates.  3. Serial cardiac enzymes.  4. Add aspirin to regimen.  5. Premedicate with steroids (prednisone 60 mg 18 hours before and two     hours  before procedure, with 100 mg intravenous Solu-Medrol on call     to cardiac catheterization) in view of DYE allergy.  HISTORY OF PRESENT ILLNESS: Mr. Fortin of a very pleasant 71 year old diabetic male, who is status post CABG x5 five years earlier, history of hypertension, dyslipidemia; he is status post normal GXT in our clinic in January 2003.  Two days earlier he developed severe epigastric/anterior chest fullness, with sour taste, lasting approximately 20 minutes.  He had residual malaise.  He thought this was related to his GERD and hiatal hernia.  He took newly prescribed PPI.  His wife insisted on follow-up with primary cardiologist so he presented to clinic today, where EKG revealed Q waves inferiorly suspicious of "old" inferior MI with lateral ST-T wave abnormalities.  He was discomfort- free at the time of evaluation in the clinic.  He was given aspirin 325 mg, prednisone 60 mg and asked to report to [facility] Emergency Room.  Troponin I in clinic is 11.62.  ALLERGIES:  1. MERCURY.  2. IODINE.  MEDICATIONS:  1. Teveten 600 mg p.o. q.a.m.  2. Zocor 10 mg p.o. q.p.m.  3. Norvasc 5 mg p.o. q.h.s.  4. Zetia 10 mg  p.o. q.a.m.  5. Glyburide 5 mg 1/2 tablet p.o. q.d.  6. Protonix 40 mg p.o. q.a.m. (new x1 day).  7. Voltaren 25 mg two tablets p.o. q.d.  8. Androgel applied topically to bilateral upper arms b.i.d.  SOCIAL HISTORY: Tobacco/EtOH, negative.  FAMILY HISTORY: Father positive for CAD.  REVIEW OF SYSTEMS: As in HPI/Past Medical History.  Otherwise, denies problems with light headedness, dizziness, syncope, or near syncopal episodes.  Denies orthopnea, PND, DOE, or pedal edema.  Problems with reflux/hiatal hernia. Does have urethral stricture.  Generalized stiffness of joints.  Wears corrective lenses.  Occasional dry skin with cracking.  Denies problems with sleep and rest.  PHYSICAL EXAMINATION:  VITAL SIGNS: Blood pressure 138/80,  heart rate 60 and regular.  Weight 165 pounds.  Height 6 feet 9 inches.  GENERAL: He is a well-nourished, pleasant, conversant 70 year old gentleman in no apparent distress.  He denies discomfort.  His concerned wife is in attendance.  HEENT/NECK: Brisk bilateral carotid upstrokes without bruits.  Neck with no JVD, no thyromegaly.  CHEST:  Lung sounds clear with equal bilateral excursion.  Negative CVA tenderness.  CARDIAC: Regular rate and rhythm without murmurs, rubs, or gallops.  Normal S1 and S2.  VASCULAR: Negative pedal edema.  Distal pulses intact.  NEUROLOGIC: Cranial nerves II-XII grossly intact.  Alert and oriented x3.  GU/RECTAL: Deferred.  LABORATORY DATA: Data from May 08, 2001 - sodium 139, K 5.3, chloride 103, CO2 32, BUN 20, creatinine 1.5, glucose 144, calcium 9.7, SGOT elevated at 183, SGPT elevated at 89.  Coags within normal range.  Hemoglobin 14.6, WBC 7.8, platelets 190,000.  EKG today in clinic revealed sinus bradycardia at 49 beats per minute with "old" inferior MI with large Q waves in lead III and small Q waves in II and aVF; slight ST depression/downsloping with T wave abnormality in I, aVL, and V4 through V6.  Chest x-ray in clinica today was negative for CHF, no active disease. Dictated by:   Anselm Lis, N.P. Attending Physician:  Lyn Records. Iii DD:  08/13/01 TD:  08/14/01 Job: [id number]

## 2010-10-13 NOTE — Cardiovascular Report (Signed)
NAME:  Ethan Vargas, Ethan Vargas NO.:  [id number]   MEDICAL RECORD NO.:  [id number]          PATIENT TYPE:  OIB   LOCATION:  NA                           FACILITY:  [facility]   PHYSICIAN:  Lyn Records, M.D.   DATE OF BIRTH:  15-Feb-1941   DATE OF PROCEDURE:  10/01/2006  DATE OF DISCHARGE:                            CARDIAC CATHETERIZATION   INDICATION:  Mr. Greth has a history of complicated cardiovascular  disease with coronary artery disease as the primary problem.  He  underwent coronary artery bypass grafting in 1998.  He has some  sequelae, suffered an acute inferior infarction in 2003 and underwent  emergency saphenous vein graft intervention with multiple stents placed  in the saphenous vein graft to the right coronary.  He is known to have  moderate reduction in LV systolic function.  He has had vague recent  symptoms. A Cardiolite study performed less than a year ago demonstrated  a relatively fixed inferior wall defect with peri-infarct ischemia.  No  significant change in perfusion was noted when compared to a study from  2005.  With vague recent symptoms, we have decided to perform coronary  angiography to rule out right bypass graft restenosis/progression of  disease.   PROCEDURE PERFORMED:  1. Left heart cath.  2. Selective coronary angiography.  3. Left ventriculography.  4. Saphenous vein graft angiography.  5. Internal mammary graft angiography.  6. Abdominal aortography to rule out abdominal aortic aneurysm and to      document patency of right renal artery stent.  7. Internal mammary artery angiography.   DESCRIPTION:  After informed consent, a 4 French sheath was placed in  the right femoral artery using the modified Seldinger technique.  A 4  French A tube multi-purpose catheter was used for hemodynamic  recordings, left ventriculography by hand injection, selective bypass  graft angiography and native right coronary angiography.  We used a #4  4  French left diagnostic catheter for the left coronary angiography and an  internal mammary artery catheter 4 [place] was used for internal mammary  angiography.  Following bypass graft angiography, we performed an  abdominal aortography using a 4 French pigtail catheter positioned above  the renal arteries.  The study was done at 16 mL per second for a total  of 35 mL.  No complications occurred, and the procedure was terminated.   RESULTS:  1. Hemodynamic data:      a.     Aortic pressure 149/68.      b.     Left ventricular pressure 149/13.  2. Left ventriculography:  The left ventricle demonstrates normal      overall contractility of the anterior wall.  There is diminished      inferobasal contractility and inferobasal aneurysm formation.      Overall, ejection fraction, however, is estimated to be at least      40%.  No mitral regurgitation was noted.  3. Coronary angiography.      a.     Left main coronary artery:  Widely patent with perhaps 25%  proximal narrowing.  Moderate calcification was noted in the left       main.      b.     Left anterior descending coronary:  The LAD is totally       occluded after the second __________ appropriate in the mid       vessel.  There is 60-70% stenosis noted beyond the first of the       perforator.      c.     Circumflex artery:  The circumflex coronary artery contains       high-grade proximal stenosis.  The distal branches are occluded.      d.     Right coronary:  The right coronary artery is totally       occluded proximal and gives rise to right collaterals.  4. Bypass graft angiography:      a.     Saphenous vein graft to the right coronary/PDA:  This graft       is widely patent.  The previously placed overlapping __________       stents in the mid and proximal saphenous vein graft are widely       patent.  The PDA retrogradely fills on the continuation of the       right coronary and no high-grade obstruction was seen.       b.     Saphenous vein graft to the diagonal:  This graft is widely       patent.  There is 40% stenosis in the distal vein graft before the       insertion into the native diagonal.      c.     Saphenous vein graft to the circumflex:  This graft is       widely patent.  It supplies a trifurcating left lateral wall       circumflex branch.   IV:  Left internal mammary graft to the LAD:  This graft is widely  patent.  It is very tortuous.  No obstruction is felt to be present in  the ostium or body of the graft.  It ends on the mid LAD, and the LAD is  widely patent beyond the graft insertion site.   V:  Abdominal aortography:  Abdominal aortography does not reveal any  evidence for abdominal aortic aneurysm.  This was the indication for the  study.  Contrast flow was suboptimal but definitive evaluation of the  right renal artery stent, although this stent is obviously patent and  appears to be widely patent without definite significant regions of  restenosis.   CONCLUSIONS:  1. Widely patent saphenous vein grafts including the previously      stented saphenous vein graft to the right coronary.  2. Widely patent LIMA to the LAD.  3. Severe native vessel coronary disease with totally occluded native      right coronary, totally occluded LAD, and totally occluded obtuse      marginals from the circumflex.  4. Left ventricular dysfunction with inferobasal aneurysm.  Left      ventricular EF is estimated to be 40%.  No mitral regurgitation is      noted.  5. Abdominal aortography demonstrates no evidence of the aortic      aneurysm, likely patency of the stent to the right kidney within      the right renal artery.   PLAN:  1. Aggressive risk factor modification including aggressive diabetes  control.  2. Resumption of iron therapy and possibly reconsideration of      gastrointestinal workup to rule out sources of blood loss. 3. I encouraged the patient to become a little  more compliant,      especially with his diabetes control.      Lyn Records, M.D.     HWS/MEDQ  D:  10/01/2006  T:  10/01/2006  Job:  [id number]   cc:   Donia Guiles, M.D.

## 2010-10-13 NOTE — H&P (Signed)
[place]. [facility]  Patient:    COPE, MARTE Visit Number: [id number] MRN: [id number]          Service Type: MED Location: 2000 2004 01 Attending Physician:  Marnee Guarneri Dictated by:   Colleen Can. Deborah Chalk, M.D. Admit Date:  10/28/2001   CC:         Darci Needle, M.D.  Desma Maxim, M.D.   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 He is married.  He has two sons.  No history of alcohol or cigarettes.  HISTORY OF PRESENT ILLNESS:  At approximately 7:15 p.m. tonight, he developed the onset of substernal chest pain that was anterior, radiated to his left chest and very similar to his previous chest pain in February of 2003.  He was eating.  He took a total of three nitroglycerin without relief in the emergency room.  Gradually, the pain moved to his neck and he had a resultant fullness in his throat but gradually disappeared.  He had a history of a myocardial infarction with stents to the saphenous vein graft to his right coronary artery in February of 2003.  He has a history of coronary artery bypass grafting in 1998 with LIMA to the LAD, a saphenous vein graft to the diagonal, saphenous vein graft to the obtuse marginal and distal left circumflex and a saphenous vein graft to the right coronary artery.  He also developed hiccups with some nausea and burping and that was similar to his previous symptoms with his previous myocardial infarction.  He did not have any vomiting.  He does have a history of gastroesophageal reflux disease.  He recently finished cardiac rehab.  ALLERGIES:  He has a CONTRAST DYE allergy.  He had hives and edema but was not really short of breath.  He also has been allergic to IODINE and PLAVIX; PLAVIX causes a rash, IODINE causes pruritus.  PENICILLIN also causes pruritus.  CURRENT MEDICINES:  1. Sublingual nitroglycerin.  2. Aspirin.  3.  Teveten 600 mg p.o. q.d.  4. Zocor 10 mg per day.  5. Norvasc 5 mg p.o. q.d.  6. Zetia 10 mg p.o. q.d.  7. Glyburide 5 mg a half a tablet p.o. q.d.  8. Protonix.  9. Voltaren. 10. AndroGel applied to upper arms b.i.d. as before. 11. Toprol-XL 25 mg a day.  PAST MEDICAL HISTORY:  His other medical problems include a history of diabetes as well as history of mild renal insufficiency in the past.  He has had hypertension for over 25 years and has a history of gastroesophageal reflux disease.  FAMILY HISTORY:  Positive for coronary disease in his father.  REVIEW OF SYSTEMS:  As noted above.  He has had a history of a urethral stricture, some stiffness of his joints, he wears corrective lenses, occasional dry skin.  No GI or GU symptoms other than noted.  PHYSICAL EXAMINATION:  VITAL SIGNS:  His heart rate is 54.  Blood pressure is 137/77.  Respiratory rate is 24.  GENERAL:  He is a somewhat pleasant, pale white male.  HEENT:  Negative.  LUNGS:  Clear.  NECK:  No jugular venous distention.  No masses in his neck.  HEART:  Regular rate and rhythm.  ABDOMEN:  Soft and nontender.  EXTREMITIES:  Good pedal pulses.  There are no femoral artery bruits.  LABORATORY AND ACCESSORY DATA:  EKG showed inferior Q waves, normal sinus rhythm, nonspecific ST  and T wave changes.  Chest x-ray shows previous coronary artery bypass grafting and cardiomegaly.  OVERALL IMPRESSION:  1. Chest pain, compatible with angina and similar coronary pain.  2. Status post previous percutaneous coronary intervention and a saphenous     vein graft to the right coronary artery in February of 2003.  3. Status post coronary artery bypass grafting x5.  4. Allergies to iodine, dye contrast and Plavix.  5. Adult-onset diabetes mellitus.  6. History of hypertension.  PLAN:  We will admit.  Will rule out myocardial infarction.  We will give him pre-catheterization medicines including prednisone and Benadryl  as pre-catheterization in consideration of possible catheterization tomorrow. Dictated by:   Colleen Can Deborah Chalk, M.D. Attending Physician:  Marnee Guarneri DD:  10/28/01 TD:  10/30/01 Job: [id number]

## 2010-10-13 NOTE — Cardiovascular Report (Signed)
[place]. [facility]  Patient:    Ethan Vargas, Ethan Vargas Visit Number: [id number] MRN: [id number]          Service Type: MED Location: 2000 2004 01 Attending Physician:  Lyn Records. Iii Dictated by:   Armanda Magic, M.D. Proc. Date: 10/30/01 Admit Date:  10/28/2001 Discharge Date: 10/30/2001   CC:         Desma Maxim, M.D.  Darci Needle, M.D.   Cardiac Catheterization  REFERRING PHYSICIAN: Darci Needle, M.D.  PRIMARY CARE PHYSICIAN: Desma Maxim, M.D.  HISTORY OF PRESENT ILLNESS: This is a 70 year old white male with a history of coronary artery disease, status post coronary artery bypass grafting who recently underwent PTCA stenting of the saphenous vein graft to the right coronary artery in February of 2003. He now presents back with recurrent chest pain. The patient did rule out for myocardial infarction.  PROCEDURES: Left heart catheterization, coronary angiography, left ventriculography, saphenous vein graft, and left internal mammary artery angiography.  OPERATOR: Armanda Magic, M.D.  INDICATIONS: Chest pain, coronary artery disease.  COMPLICATIONS: None.  INTERVENOUS ACCESS: Via right femoral artery 6 French sheath.  DESCRIPTION OF PROCEDURE: The patient is brought to the cardiac catheterization laboratory in a fasting, nonsedated state.  Informed consent was obtained.  The patient was connected to continuous heart rate and pulse oximetry monitoring, and intermittent blood pressure monitoring. The right groin was prepped and draped in a sterile fashion.  Lidocaine 1% was used for local anesthesia.  Using the modified Seldinger technique, a 6 French sheath was placed in the right femoral artery.  Under fluoroscopic guidance, a 6 French Judkins JL4 catheter was placed in the left coronary artery. Multiple cine films were taken in the 30-degree RAO, 40-degree LAO views. This catheter was then exchanged out over a guide  wire for a 6 [place] JR4 catheter, which was placed under fluoroscopic guidance to the right coronary artery. Multiple cine films were taken in a 30-degree RAO, 40-degree LAO views. The catheter was then pulled back and engaged in the saphenous vein graft to the diagonal with multiple cine films taken in a 30-degree RAO, 40-degree LAO views. The catheter was then pulled back into the saphenous vein graft to the distal left circumflex, OM sequential graft, and multiple films were taken in a 30-degree RAO, 40-degree LAO views. The catheter was then attempted to engage the saphenous vein graft to the right coronary artery, but unsuccessful. This catheter was then exchanged over a guide wire for a 6 French RCB catheter, which successfully engaged the ostium of the vein graft to the right coronary artery, and multiple cine films were taken in the 30-degree RAO, 40-degree LAO views. This catheter was then exchanged out over a guide wire for a 6 [place] LIMA catheter, which was placed over the guide wire and placed into the left subclavian vein and then into the ostium of the left internal mammary artery. Angiography was performed in a 30-degree RAO, 40-degree LAO views. This catheter was then exchanged out over a guide wire for a 6 French angled pigtail catheter, which was placed in the left ventricular cavity. Left ventriculography was performed in the 30-degree RAO view using a total of 30 cc of contrast at 13 cc/sec. The catheter was then pulled back across the aortic valve with no significant pressure gradient. At the end of the procedure, all catheters and sheaths were removed. Manual compression was performed until adequate hemostasis was obtained. The patient was  transferred back to the room in stable condition.  RESULTS: 1. The left main coronary artery had a 30-40% proximal stenosis. 2. The LAD had a 99% proximal stenosis at the first septal perforator    takeoff and then was  occluded. 3. The left circumflex was a 50-60% mid stenosis with both OM-1 and OM-2    filling by saphenous vein graft. 4. The right coronary artery is diffusely diseased up to 70-80% proximally    and then occluded in the mid vessel. 5. Saphenous vein graft to the right coronary artery is widely patent.    The stent with luminal irregularities up to 20%. 6. Saphenous vein graft to the sequential distal left circumflex and OM was    widely patent and the saphenous vein graft to the diagonal is widely    patent. 7. The LIMA to the LAD was widely patent.  LEFT VENTRICULOGRAM: The left ventriculography performed in a 30-degree RAO view using a total of 30 cc of contrast at 13 cc/sec. showed mild to moderate LV dysfunction, EF 35-40%. There was an inferobasal akinesis and mild MR. Left ventricular pressure was 150/2, and aortic pressure was 158/72 mmHg.  ASSESSMENT: 1. Coronary disease, status post coronary artery bypass grafting, status post    percutaneous transluminal coronary angioplasty stenting of the saphenous    vein graft to the right coronary artery recently. 2. Noncardiac chest pain. 3. Widely patent stent to the saphenous vein graft to the right coronary    artery. 4. Mild to moderate left ventricular dysfunction, ejection fraction 35-40%. 5. Mild mitral regurgitation. 6. Bradycardia.  PLAN: Discharge to home after bed rest and IV fluids. Followup in 1-2 weeks with Dr. Katrinka Blazing. Discontinue Toprol-XL secondary to decreased heart rate and continue Protonix on 40 mg daily and not on the p.r.n. basis. Dictated by:   Armanda Magic, M.D. Attending Physician:  Lyn Records. Iii DD:  10/30/01 TD:  11/02/01 Job: [id number]

## 2010-10-13 NOTE — Discharge Summary (Signed)
[place]. [facility]  Patient:    Ethan Vargas, Ethan Vargas Visit Number: [id number] MRN: [id number]          Service Type: MED Location: 2000 2039 01 Attending Physician:  Lyn Records. Iii Dictated by:   Anselm Lis, N.P. Admit Date:  07/09/2001 Discharge Date: 07/13/2001   CC:         Desma Maxim, M.D.   Discharge Summary  PRIMARY CARE [place]:  Desma Maxim, M.D.  DATE OF BIRTH:  1941-05-12.  PROCEDURES:  July 10, 2001, successful percutaneous coronary intervention/ recanalization of saphenous vein graft to right coronary artery but with distal embolization and thrombus-filled vessel proximal, within, and distal to lesion. Patent saphenous vein graft to diagonal/obtuse marginal, and patent left internal mammary artery to left anterior descending coronary artery. Decreased left ventricular function with ejection fraction of  45-60%, inferior akinesis.  DISCHARGE DIAGNOSES:  1. Coronary atherosclerotic heart disease.     a. Recent prehospital inferior myocardial infarction with peak CK of 863,        MB fraction 69.1, troponin I at 8.71.     b. July 10, 2001, cardiac catheterization with percutaneous coronary        intervention; successful recanalization of saphenous vein graft to        right coronary artery but with distal embolization and thrombus-filled        vessel proximal, within, and distal to lesion.  Patent saphenous        vein graft to diagonal/obtuse marginal, and left internal mammary        artery to the left anterior descending coronary artery.  Decreased        left ventricular function with ejection fraction of 45-60%.  Comment:        Timing of catheterization was deferred as patient has completed his        myocardial infarction prior to admission and to provide adequate time        for steroidal  protection in setting of dye allergy.     c. The patient did have bump in CK-MB post catheter-based  intervention        with peak CK of 863, MB fraction of 69.  Tracking downward close to        time of discharge to CK of 320, MB fraction 5.8.  2. Diabetes mellitus, capillary blood glucoses running 83-381.  He was     managed by sliding scale insulin.  3. Renal insufficiency:  Postcatheterization peak BUN of 23, creatinine 1.4;     16 and 1.0 at time of discharge.  PLAN:  The patient was discharged home in stable condition.  DISCHARGE MEDICATIONS:  1. Nitroglycerin tablets 0.4 mg sublingual p.r.n. chest pain every five     minutes up to three tablets in 15 minutes (new).  2. Enteric-coated aspirin 325 mg one p.o. q.d. (new).  3. Teveten 600 mg p.o. q.d.  4. Zocor 10 mg p.o. q.d.  5. Norvasc 5 mg p.o. q.d.  6. Zetia 10 mg p.o. q.d.  7. Glyburide 5 mg one-half tablet p.o. q.d.  8. Protonix (or whatever was on for stomach acid prior to admission) 40 mg     p.o. q.d.  9. Voltaren 25 mg two tablets p.o. q.d. 10. Androgel apply to upper arms b.i.d. as before. 11. Plavix 75 mg p.o. q.d. (new). 12. Toprol XL 25 mg p.o. q.d. (new).  DIET:  Low fat, low cholesterol, no added  salt.  WOUND CARE:  May shower.  SPECIAL INSTRUCTIONS:  The patient is to call our clinic if _____ swelling or bruising in the groin area.  FOLLOW-UP:  Our office will call with follow-up office visit to see Dr. Katrinka Blazing in about 10 to 14 days.  HISTORY OF PRESENT ILLNESS:  Ethan Vargas is a very pleasant 70 year old diabetic male with a history of CABG x5 five years earlier, history of hypertension, dyslipidemia.  He is status post GXT in clinic January of this year.  Two days prior to admission he developed a severe epigastric/anterior chest fullness with sour taste lasting approximately 20 minutes.  He had residual malaise.  The patient thought this was related to GERD.  He took a new PPI. His wife insisted on follow-up with Dr. Katrinka Blazing.  When seen in clinic the day of admission it was noted he had  Q-waves suspicious for inferior MI with lateral ST-T wave abnormalities.  He was discomfort-free at the time of evaluation in clinic.  He was given 25 mg of aspirin as well as 60 mg of prednisone as precatheterization dye exposure protection in setting of allergy to iodinated products.  He was sent to the emergency room.  His troponin I in clinic was 11.62.  HOSPITAL COURSE:  He was admitted to [facility] telemetry unit with IV heparin and also started on Plavix.  He was also given 20 mg of IV Pepcid on call.  On July 10, 2001, he was sent to the catheterization lab with the final results:  (1) LV gram:  Inferior akinesis with EF 45-60%; (2) Coronary angiography:  (a) Left main:  30%.  (b) LAD:  100% mid-, 95% before ST.  (c) CFX:  OM-1 100%.  (d) RCA:  100% mid.  Distal collaterals to LV and PDA.  (3) Bypass graft angiography:  (a) SVG to RCA:  100%.  (b) SVG to OM/CFX:  Patent.  (c) SVG to diagonal:  Patent.  (d) LIMA to the LAD:  Patent. (4) PCI:  SVG to RCA, reduction of stenosis from 100% to 30-50% mid.  Filter wire was utilized.  Comment:  Successful canalization of SVG to RCA but with distal embolization of thrombus-filled vessel proximal, within, and distal to lesion.  Patient was given IV Integrilin bolus and given infusion as well as continuation of Plavix.  Serial cardiac enzymes were followed post procedure, and he did have a little bit of a bump in CK-MB to 660 CK with MB fraction of 53 (from 519/35 prior set).  Day post procedure BUN 23, creatinine 1.4 (from 19/1.1).  Patient ambulated up and about with cardiac rehab with good tolerance and was discharged home in stable condition on July 13, 2001. His creatinine had improved to baseline at 1.0.  His peak CK post catheterization was 863, MB fraction 69.1, but tracking downward at time of discharge to CK 320, MB fraction 5.8.  LABORATORY DATA:  WBC 5.9, hemoglobin 13.8, hematocrit 40.3, platelets of 138. At  the time of discharge hemoglobin 10.6, platelets of 130.  Admission sodium  142, potassium 3.7, chloride of 105, CO2 28, glucose 103, BUN 19, creatinine 1.1.  At the time of discharge glucose was 175, BUN of 16, creatinine 1.0. Potassium was 4.2.  Serial cardiac enzymes:  First CK of 592, MB fraction of 41.1, troponin I 7.09.  Second CK 519, MB fraction 34.6, troponin I of 8.71. Third CK 660, MB fraction 53.3.  Fourth CK of 708, MB fraction 61.3.  Fifth CK of 863, MB fraction of 69.1.  Last CK was 320, MB fraction 5.8.  Of note, the last three sets of cardiac enzymes were obtained post PCI.  Chest x-ray done in clinic just prior to admission revealed no CHF, no active disease.  EKG in clinic prior to admission revealed sinus bradycardia at 49 beats per minute with "old" inferior MI with large Q wave, lead III, and small Q-waves II, aVF.  Slight ST depression and downsloping with T-wave abnormality I, aVL, V3 through V6.  PAST MEDICAL HISTORY:  1. History of coronary atherosclerotic heart disease.     a. September 1998, CABG x5 with LIMA to the LAD, SVG to diagonal,        sequential SVG to OM-2 and distal circumflex.  Prior cardiac        catheterization before bypass surgery revealed EF 55% with inferior        wall hypokinesis, negative MR.     b. January 2003, in-office GXT, which was within normal limits.  2. History of hypertension greater than 10 years.  In January 2001 had MRA     which revealed 60% right renal artery stenosis, left okay.  3. History of hiatal hernia, GERD.  4. Diabetes mellitus x8 years, type 2.  5. History of psoriatic arthritis.  6. Hypercholesterolemia.  7. Right knee surgery.  8. History of urethral stricture.  9. Glaucoma. 10. History of DYE allergy. Dictated by:   Anselm Lis, N.P. Attending Physician:  Lyn Records. Iii DD:  08/13/01 TD:  08/14/01 Job: [id number]

## 2011-05-24 ENCOUNTER — Other Ambulatory Visit: Payer: Self-pay | Admitting: Family Medicine

## 2011-05-24 ENCOUNTER — Ambulatory Visit
Admission: RE | Admit: 2011-05-24 | Discharge: 2011-05-24 | Disposition: A | Discharge status: Home / Self Care | Payer: BC Managed Care – PPO | Source: Ambulatory Visit | Attending: Family Medicine | Admitting: Family Medicine

## 2011-05-24 DIAGNOSIS — R609 Edema, unspecified: Secondary | ICD-10-CM

## 2011-05-28 ENCOUNTER — Inpatient Hospital Stay (HOSPITAL_COMMUNITY)
Admission: AD | Admit: 2011-05-28 | Discharge: 2011-06-01 | DRG: 277 | Disposition: A | Discharge status: Home / Self Care | Payer: BC Managed Care – PPO | Source: Ambulatory Visit | Attending: Internal Medicine | Admitting: Internal Medicine

## 2011-05-28 ENCOUNTER — Encounter (HOSPITAL_COMMUNITY): Payer: Self-pay

## 2011-05-28 DIAGNOSIS — E118 Type 2 diabetes mellitus with unspecified complications: Secondary | ICD-10-CM

## 2011-05-28 DIAGNOSIS — I429 Cardiomyopathy, unspecified: Secondary | ICD-10-CM

## 2011-05-28 DIAGNOSIS — D649 Anemia, unspecified: Secondary | ICD-10-CM

## 2011-05-28 DIAGNOSIS — E785 Hyperlipidemia, unspecified: Secondary | ICD-10-CM

## 2011-05-28 DIAGNOSIS — I10 Essential (primary) hypertension: Secondary | ICD-10-CM

## 2011-05-28 DIAGNOSIS — E1142 Type 2 diabetes mellitus with diabetic polyneuropathy: Secondary | ICD-10-CM | POA: Diagnosis present

## 2011-05-28 DIAGNOSIS — N179 Acute kidney failure, unspecified: Secondary | ICD-10-CM

## 2011-05-28 DIAGNOSIS — E782 Mixed hyperlipidemia: Secondary | ICD-10-CM | POA: Diagnosis present

## 2011-05-28 DIAGNOSIS — I2510 Atherosclerotic heart disease of native coronary artery without angina pectoris: Secondary | ICD-10-CM

## 2011-05-28 DIAGNOSIS — E1149 Type 2 diabetes mellitus with other diabetic neurological complication: Secondary | ICD-10-CM | POA: Diagnosis present

## 2011-05-28 DIAGNOSIS — N289 Disorder of kidney and ureter, unspecified: Secondary | ICD-10-CM

## 2011-05-28 DIAGNOSIS — I252 Old myocardial infarction: Secondary | ICD-10-CM

## 2011-05-28 DIAGNOSIS — M545 Low back pain, unspecified: Secondary | ICD-10-CM | POA: Diagnosis present

## 2011-05-28 DIAGNOSIS — E1165 Type 2 diabetes mellitus with hyperglycemia: Secondary | ICD-10-CM | POA: Diagnosis present

## 2011-05-28 DIAGNOSIS — G589 Mononeuropathy, unspecified: Secondary | ICD-10-CM

## 2011-05-28 DIAGNOSIS — R0602 Shortness of breath: Secondary | ICD-10-CM

## 2011-05-28 DIAGNOSIS — I25110 Atherosclerotic heart disease of native coronary artery with unstable angina pectoris: Secondary | ICD-10-CM | POA: Insufficient documentation

## 2011-05-28 DIAGNOSIS — Z951 Presence of aortocoronary bypass graft: Secondary | ICD-10-CM

## 2011-05-28 DIAGNOSIS — Z794 Long term (current) use of insulin: Secondary | ICD-10-CM

## 2011-05-28 DIAGNOSIS — L03119 Cellulitis of unspecified part of limb: Principal | ICD-10-CM

## 2011-05-28 DIAGNOSIS — N183 Chronic kidney disease, stage 3 unspecified: Secondary | ICD-10-CM | POA: Diagnosis present

## 2011-05-28 DIAGNOSIS — G609 Hereditary and idiopathic neuropathy, unspecified: Secondary | ICD-10-CM | POA: Insufficient documentation

## 2011-05-28 DIAGNOSIS — I129 Hypertensive chronic kidney disease with stage 1 through stage 4 chronic kidney disease, or unspecified chronic kidney disease: Secondary | ICD-10-CM | POA: Diagnosis present

## 2011-05-28 DIAGNOSIS — K2270 Barrett's esophagus without dysplasia: Secondary | ICD-10-CM | POA: Diagnosis present

## 2011-05-28 DIAGNOSIS — L02419 Cutaneous abscess of limb, unspecified: Principal | ICD-10-CM | POA: Diagnosis present

## 2011-05-28 DIAGNOSIS — N529 Male erectile dysfunction, unspecified: Secondary | ICD-10-CM

## 2011-05-28 DIAGNOSIS — IMO0001 Reserved for inherently not codable concepts without codable children: Secondary | ICD-10-CM

## 2011-05-28 HISTORY — DX: Acute myocardial infarction, unspecified: I21.9

## 2011-05-28 HISTORY — DX: Essential (primary) hypertension: I10

## 2011-05-28 HISTORY — DX: Sleep apnea, unspecified: G47.30

## 2011-05-28 LAB — CBC
MCH: 29.3 pg (ref 26.0–34.0)
MCHC: 32.3 g/dL (ref 30.0–36.0)
MCV: 90.6 fL (ref 78.0–100.0)
Platelets: 125 10*3/uL — ABNORMAL LOW (ref 150–400)
RDW: 13.5 % (ref 11.5–15.5)

## 2011-05-28 LAB — COMPREHENSIVE METABOLIC PANEL
ALT: 23 U/L (ref 0–53)
AST: 21 U/L (ref 0–37)
Albumin: 2.4 g/dL — ABNORMAL LOW (ref 3.5–5.2)
CO2: 26 mEq/L (ref 19–32)
Calcium: 9 mg/dL (ref 8.4–10.5)
Creatinine, Ser: 1.28 mg/dL (ref 0.50–1.35)
GFR calc non Af Amer: 56 mL/min — ABNORMAL LOW (ref >90)
Sodium: 137 mEq/L (ref 135–145)
Total Protein: 6.3 g/dL (ref 6.0–8.3)

## 2011-05-28 LAB — DIFFERENTIAL
Basophils Relative: 0 % (ref 0–1)
Eosinophils Absolute: 0.1 10*3/uL (ref 0.0–0.7)
Lymphocytes Relative: 13 % (ref 12–46)
Lymphs Abs: 1 10*3/uL (ref 0.7–4.0)
Monocytes Absolute: 0.5 10*3/uL (ref 0.1–1.0)
Neutro Abs: 6.2 10*3/uL (ref 1.7–7.7)

## 2011-05-28 MED ORDER — VITAMIN D3 25 MCG (1000 UNIT) PO TABS
1000.0000 [IU] | ORAL_TABLET | Freq: Every day | ORAL | Status: DC
  Administered 2011-05-29 – 2011-06-01 (×4): 1000 [IU] via ORAL
  Filled 2011-05-28 (×5): qty 1

## 2011-05-28 MED ORDER — AZELASTINE HCL 0.05 % OP SOLN: 1.0000 [drp] | Freq: Every day | OPHTHALMIC | Status: DC | PRN

## 2011-05-28 MED ORDER — LATANOPROST 0.005 % OP SOLN
1.0000 [drp] | Freq: Every day | OPHTHALMIC | Status: DC
  Administered 2011-05-28 – 2011-05-31 (×3): 1 [drp] via OPHTHALMIC
  Filled 2011-05-28: qty 2.5

## 2011-05-28 MED ORDER — CLINDAMYCIN PHOSPHATE 900 MG/50ML IV SOLN
900.0000 mg | Freq: Three times a day (TID) | INTRAVENOUS | Status: DC
  Administered 2011-05-28 – 2011-05-29 (×2): 900 mg via INTRAVENOUS
  Filled 2011-05-28 (×5): qty 50

## 2011-05-28 MED ORDER — NITROGLYCERIN 0.4 MG SL SUBL: 0.4000 mg | SUBLINGUAL_TABLET | SUBLINGUAL | Status: DC | PRN

## 2011-05-28 MED ORDER — HYDROMORPHONE HCL PF 1 MG/ML IJ SOLN
1.0000 mg | INTRAMUSCULAR | Status: DC | PRN
  Administered 2011-05-28 – 2011-06-01 (×14): 1 mg via INTRAVENOUS
  Filled 2011-05-28 (×14): qty 1

## 2011-05-28 MED ORDER — ROSUVASTATIN CALCIUM 10 MG PO TABS
10.0000 mg | ORAL_TABLET | Freq: Every day | ORAL | Status: DC
  Administered 2011-05-28 – 2011-05-31 (×4): 10 mg via ORAL
  Filled 2011-05-28 (×5): qty 1

## 2011-05-28 MED ORDER — SODIUM CHLORIDE 0.9 % IJ SOLN: 3.0000 mL | INTRAMUSCULAR | Status: DC | PRN

## 2011-05-28 MED ORDER — SODIUM CHLORIDE 0.9 % IJ SOLN
3.0000 mL | Freq: Two times a day (BID) | INTRAMUSCULAR | Status: DC
  Administered 2011-05-29 – 2011-05-31 (×5): 3 mL via INTRAVENOUS

## 2011-05-28 MED ORDER — POLYVINYL ALCOHOL 1.4 % OP SOLN
1.0000 [drp] | Freq: Two times a day (BID) | OPHTHALMIC | Status: DC
  Administered 2011-05-29 – 2011-05-31 (×5): 1 [drp] via OPHTHALMIC
  Filled 2011-05-28 (×2): qty 15

## 2011-05-28 MED ORDER — LISINOPRIL 20 MG PO TABS
30.0000 mg | ORAL_TABLET | Freq: Two times a day (BID) | ORAL | Status: DC
  Administered 2011-05-29 – 2011-05-30 (×4): 30 mg via ORAL
  Filled 2011-05-28 (×7): qty 1

## 2011-05-28 MED ORDER — PANTOPRAZOLE SODIUM 40 MG PO TBEC
40.0000 mg | DELAYED_RELEASE_TABLET | Freq: Every day | ORAL | Status: DC
  Administered 2011-05-28 – 2011-06-01 (×5): 40 mg via ORAL
  Filled 2011-05-28 (×5): qty 1

## 2011-05-28 MED ORDER — HYDROCODONE-ACETAMINOPHEN 5-325 MG PO TABS
1.0000 | ORAL_TABLET | ORAL | Status: DC | PRN
  Administered 2011-05-29 – 2011-06-01 (×13): 2 via ORAL
  Filled 2011-05-28 (×14): qty 2

## 2011-05-28 MED ORDER — DOXAZOSIN MESYLATE 4 MG PO TABS
4.0000 mg | ORAL_TABLET | Freq: Every day | ORAL | Status: DC
  Administered 2011-05-28 – 2011-06-01 (×5): 4 mg via ORAL
  Filled 2011-05-28 (×5): qty 1

## 2011-05-28 MED ORDER — ASPIRIN 325 MG PO TABS
325.0000 mg | ORAL_TABLET | Freq: Every day | ORAL | Status: DC
  Administered 2011-05-28 – 2011-06-01 (×5): 325 mg via ORAL
  Filled 2011-05-28 (×5): qty 1

## 2011-05-28 MED ORDER — CLOBETASOL PROPIONATE 0.05 % EX OINT
1.0000 "application " | TOPICAL_OINTMENT | Freq: Two times a day (BID) | CUTANEOUS | Status: DC | PRN
  Filled 2011-05-28: qty 15

## 2011-05-28 MED ORDER — FLUTICASONE PROPIONATE 50 MCG/ACT NA SUSP
2.0000 | Freq: Every day | NASAL | Status: DC
  Administered 2011-05-28 – 2011-06-01 (×5): 2 via NASAL
  Filled 2011-05-28: qty 16

## 2011-05-28 MED ORDER — INSULIN GLARGINE 100 UNIT/ML ~~LOC~~ SOLN
30.0000 [IU] | SUBCUTANEOUS | Status: DC
  Administered 2011-05-29 – 2011-05-30 (×2): 30 [IU] via SUBCUTANEOUS
  Filled 2011-05-28: qty 3

## 2011-05-28 MED ORDER — FUROSEMIDE 20 MG PO TABS
20.0000 mg | ORAL_TABLET | Freq: Every day | ORAL | Status: DC
  Administered 2011-05-29 – 2011-05-30 (×2): 20 mg via ORAL
  Filled 2011-05-28 (×4): qty 1

## 2011-05-28 MED ORDER — NIACIN ER 500 MG PO CPCR
500.0000 mg | ORAL_CAPSULE | Freq: Every day | ORAL | Status: DC
  Administered 2011-05-28 – 2011-05-31 (×4): 500 mg via ORAL
  Filled 2011-05-28 (×5): qty 1

## 2011-05-28 MED ORDER — OMEGA-3-ACID ETHYL ESTERS 1 G PO CAPS
1.0000 g | ORAL_CAPSULE | Freq: Every day | ORAL | Status: DC
  Administered 2011-05-29 – 2011-06-01 (×4): 1 g via ORAL
  Filled 2011-05-28 (×5): qty 1

## 2011-05-28 MED ORDER — PRASUGREL HCL 10 MG PO TABS
10.0000 mg | ORAL_TABLET | Freq: Every day | ORAL | Status: DC
  Administered 2011-05-28 – 2011-06-01 (×5): 10 mg via ORAL
  Filled 2011-05-28 (×5): qty 1

## 2011-05-28 MED ORDER — HEPARIN SODIUM (PORCINE) 5000 UNIT/ML IJ SOLN
5000.0000 [IU] | Freq: Three times a day (TID) | INTRAMUSCULAR | Status: DC
  Administered 2011-05-29 – 2011-06-01 (×10): 5000 [IU] via SUBCUTANEOUS
  Filled 2011-05-28 (×14): qty 1

## 2011-05-28 MED ORDER — METFORMIN HCL 500 MG PO TABS: 1000.0000 mg | ORAL_TABLET | Freq: Two times a day (BID) | ORAL | Status: DC

## 2011-05-28 MED ORDER — INSULIN ASPART 100 UNIT/ML ~~LOC~~ SOLN
0.0000 [IU] | Freq: Three times a day (TID) | SUBCUTANEOUS | Status: DC
  Administered 2011-05-29 (×2): 3 [IU] via SUBCUTANEOUS
  Administered 2011-05-29: 5 [IU] via SUBCUTANEOUS
  Administered 2011-05-30 (×2): 3 [IU] via SUBCUTANEOUS
  Administered 2011-05-30: 1 [IU] via SUBCUTANEOUS
  Administered 2011-05-31 (×2): 2 [IU] via SUBCUTANEOUS
  Administered 2011-06-01: 3 [IU] via SUBCUTANEOUS
  Administered 2011-06-01: 2 [IU] via SUBCUTANEOUS
  Filled 2011-05-28: qty 3

## 2011-05-28 MED ORDER — VANCOMYCIN HCL 1000 MG IV SOLR
1250.0000 mg | INTRAVENOUS | Status: DC
  Administered 2011-05-28 – 2011-05-30 (×3): 1250 mg via INTRAVENOUS
  Filled 2011-05-28 (×6): qty 1250

## 2011-05-28 MED ORDER — FLORA-Q PO CAPS
1.0000 | ORAL_CAPSULE | Freq: Every day | ORAL | Status: DC
  Administered 2011-05-29 – 2011-06-01 (×4): 1 via ORAL
  Filled 2011-05-28 (×5): qty 1

## 2011-05-28 MED ORDER — POLYETHYL GLYCOL-PROPYL GLYCOL 0.4-0.3 % OP SOLN: 1.0000 [drp] | Freq: Two times a day (BID) | OPHTHALMIC | Status: DC

## 2011-05-28 MED ORDER — FERROUS SULFATE 325 (65 FE) MG PO TABS
325.0000 mg | ORAL_TABLET | Freq: Three times a day (TID) | ORAL | Status: DC
  Administered 2011-05-29 – 2011-06-01 (×9): 325 mg via ORAL
  Filled 2011-05-28 (×11): qty 1

## 2011-05-28 MED ORDER — ACETAMINOPHEN 650 MG RE SUPP: 650.0000 mg | Freq: Four times a day (QID) | RECTAL | Status: DC | PRN

## 2011-05-28 MED ORDER — ACETAMINOPHEN 325 MG PO TABS: 650.0000 mg | ORAL_TABLET | Freq: Four times a day (QID) | ORAL | Status: DC | PRN

## 2011-05-28 MED ORDER — SODIUM CHLORIDE 0.9 % IV SOLN: 250.0000 mL | INTRAVENOUS | Status: DC | PRN

## 2011-05-28 MED ORDER — OLOPATADINE HCL 0.1 % OP SOLN
1.0000 [drp] | Freq: Every day | OPHTHALMIC | Status: DC | PRN
  Administered 2011-05-30: 1 [drp] via OPHTHALMIC
  Filled 2011-05-28: qty 5

## 2011-05-28 MED ORDER — METFORMIN HCL 500 MG PO TABS
1000.0000 mg | ORAL_TABLET | Freq: Two times a day (BID) | ORAL | Status: DC
  Administered 2011-05-28 – 2011-05-30 (×4): 1000 mg via ORAL
  Filled 2011-05-28 (×6): qty 2

## 2011-05-28 MED ORDER — DILTIAZEM HCL ER COATED BEADS 300 MG PO CP24
300.0000 mg | ORAL_CAPSULE | Freq: Every day | ORAL | Status: DC
  Administered 2011-05-28 – 2011-06-01 (×5): 300 mg via ORAL
  Filled 2011-05-28 (×5): qty 1

## 2011-05-28 MED ORDER — NIACIN ER (ANTIHYPERLIPIDEMIC) 500 MG PO TBCR
500.0000 mg | EXTENDED_RELEASE_TABLET | Freq: Every day | ORAL | Status: DC
  Filled 2011-05-28: qty 1

## 2011-05-28 NOTE — H&P (Signed)
Ethan Vargas is an 70 y.o. male.   Chief Complaint: Left lower extremity with erythema, pain and swelling x >1 week. HPI: Pt is 70 yr old man who states that he noticed the change in his leg a week ago yesterday. The leg is erythematous from just distal to the knee to just proximal to the ankle.  He saw his PCP the following Wednesday and was started on PO augmentin and was given hydrocodone for pain control.  He has had a doppler of the extremity and it is negative for DVT. The leg has not improved and pain has gotten worse.  Pt states that the pain is 3/10 recumbent, and 8/10 if he tries to stand on the leg.  He also has developed some loose stools in the past two days.  He denies fevers or chills.  Past Medical History  Diagnosis Date  . Myocardial infarction   . Sleep apnea   . Diabetes mellitus   . Coronary artery disease   . Hypertension     Past Surgical History  Procedure Date  . Coronary artery bypass graft     No family history on file. Social History:  does not have a smoking history on file. He does not have any smokeless tobacco history on file. He reports that he does not drink alcohol. His drug history not on file. Medications Prior to Admission  Medication Dose Route Frequency Provider Last Rate Last Dose  . 0.9 %  sodium chloride infusion  250 mL Intravenous PRN Exodus Kutzer, DO      . acetaminophen (TYLENOL) tablet 650 mg  650 mg Oral Q6H PRN Ruari Duggan, DO       Or  . acetaminophen (TYLENOL) suppository 650 mg  650 mg Rectal Q6H PRN Averey Trompeter, DO      . clindamycin (CLEOCIN) IVPB 900 mg  900 mg Intravenous Q8H Tashea Othman, DO      . Flora-Q (FLORA-Q) Capsule 1 capsule  1 capsule Oral Daily Philo Kurtz, DO      . heparin injection 5,000 Units  5,000 Units Subcutaneous Q8H Vernel Donlan, DO      . HYDROcodone-acetaminophen (NORCO) 5-325 MG per tablet 1-2 tablet  1-2 tablet Oral Q4H PRN Curlie Sittner, DO      . HYDROmorphone (DILAUDID) injection 1 mg  1 mg Intravenous Q3H  PRN Merrie Epler, DO      . insulin aspart (novoLOG) injection 0-9 Units  0-9 Units Subcutaneous TID WC Domonique Brouillard, DO      . sodium chloride 0.9 % injection 3 mL  3 mL Intravenous Q12H Apolo Cutshaw, DO      . sodium chloride 0.9 % injection 3 mL  3 mL Intravenous PRN Cloyd Ragas, DO       No current outpatient prescriptions on file as of 05/28/2011.    Allergies:  Allergies  Allergen Reactions  . Mercury Swelling  . Clopidogrel Bisulfate     REACTION: rash  . Contrast Media (Iodinated Diagnostic Agents) Swelling    Premedication with prednisone  . Iodine Swelling  . Latex     Oral nausea  . Sulfonamide Derivatives     unknown    Constitutional: negative for chills, fevers, malaise and sweats Eyes: negative for icterus, irritation, redness and visual disturbance Ears, nose, mouth, throat, and face: negative for nasal congestion, sore throat and voice change Respiratory: negative for dyspnea on exertion, hemoptysis, sputum and wheezing Cardiovascular: negative for chest pain, chest pressure/discomfort, fatigue, irregular heart  beat and syncope Gastrointestinal: positive for diarrhea, negative for abdominal pain, change in bowel habits, constipation, jaundice and nausea Genitourinary:negative for decreased stream, dysuria, frequency, hematuria and hesitancy Integument/breast: positive for skin color change and erythema to left lower extremity., negative for pruritus and skin lesion(s) Hematologic/lymphatic: negative for bleeding, lymphadenopathy, petechiae and positive for easy bruising. Musculoskeletal:positive for pain in left lower extremity., negative for arthralgias, bone pain and muscle weakness Neurological: negative for dizziness, headaches, seizures and weakness Behavioral/Psych: negative for anxiety, depression, irritability and sleep disturbance Endocrine: positive for uncontrolled glucoses.   General appearance: alert, cooperative, appears stated age and no distress Head:  Normocephalic, without obvious abnormality, atraumatic Eyes: conjunctivae/corneas clear. PERRL, EOM's intact. Fundi benign. Throat: lips, mucosa, and tongue normal; teeth and gums normal Neck: no adenopathy, no carotid bruit, no JVD, supple, symmetrical, trachea midline and thyroid not enlarged, symmetric, no tenderness/mass/nodules Resp: clear to auscultation bilaterally Chest wall: no tenderness Cardio: regular rate and rhythm, S1, S2 normal, no murmur, click, rub or gallop and no rub GI: soft, non-tender; bowel sounds normal; no masses,  no organomegaly Extremities: Rt lower extremity atraumatic without erythema, cyanosis or edema.  Lt lower extremity is erythematous, swollen, warm and painfult to touch.  Good capiillary return from toes b/l.. Pulses: 2+ and symmetric Skin: Rt lower extremity is erythemaous.  Pt also has ecchymoses to secondand third digits of the left foot and the left hand. Lymph nodes: Cervical, supraclavicular, and axillary nodes normal. Neurologic: Alert and oriented X 3, normal strength and tone. Normal symmetric reflexes. Normal coordination and gait   Results for orders placed during the hospital encounter of 05/28/11 (from the past 48 hour(s))  GLUCOSE, CAPILLARY     Status: Abnormal   Collection Time   05/28/11  2:34 PM      Component Value Range Comment   Glucose-Capillary 295 (*) 70 - 99 (mg/dL)    Comment 1 Documented in Chart      Comment 2 Notify RN     GLUCOSE, CAPILLARY     Status: Abnormal   Collection Time   05/28/11  4:11 PM      Component Value Range Comment   Glucose-Capillary 307 (*) 70 - 99 (mg/dL)    @[id number] @  Blood pressure 158/80, pulse 99, temperature 98.7 F (37.1 C), resp. rate 18, SpO2 99.00%.    Assessment/Plan 1. Cellulitis of the left lower extremity.  Pt has failed to respond to outpatient PO antibiotics.  Blood cultures x 2 and IV vancomycin and clindamycin. 2. 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  Will continue outpatient therapy and follow FSBS with SSI. 3. HTN - continue outpatient medication 4. CAD. - continue outpatient medication.  Manley Fason 05/28/2011, 5:26 PM

## 2011-05-28 NOTE — Progress Notes (Signed)
ANTIBIOTIC CONSULT NOTE - INITIAL  Pharmacy Consult for Vancomycin Indication: Severe Cellulitis LLE, failed outpatient oral Augmentin.   Allergies  Allergen Reactions  . Mercury Swelling  . Clopidogrel Bisulfate     REACTION: rash  . Contrast Media (Iodinated Diagnostic Agents) Swelling    Premedication with prednisone  . Iodine Swelling  . Latex     Oral nausea  . Sulfonamide Derivatives     unknown    Patient Measurements: Height: 5' 7.25" (170.8 cm) Weight: 160 lb (72.576 kg) (verbal from patient) IBW/kg (Calculated) : 66.68   Vital Signs: Temp: 98.7 F (37.1 C) (12/31 1443) BP: 158/80 mmHg (12/31 1443) Pulse Rate: 99  (12/31 1443) Intake/Output from previous day:   Intake/Output from this shift:    Labs: No results found for this basename: WBC:3,HGB:3,PLT:3,LABCREA:3,CREATININE:3 in the last 72 hours Estimated Creatinine Clearance: 47.6 ml/min (by C-G formula based on Cr of 1.4). No results found for this basename: VANCOTROUGH:2,VANCOPEAK:2,VANCORANDOM:2,GENTTROUGH:2,GENTPEAK:2,GENTRANDOM:2,TOBRATROUGH:2,TOBRAPEAK:2,TOBRARND:2,AMIKACINPEAK:2,AMIKACINTROU:2,AMIKACIN:2, in the last 72 hours   Microbiology: No results found for this or any previous visit (from the past 720 hour(s)).  Medical History: Past Medical History  Diagnosis Date  . Myocardial infarction   . Sleep apnea   . Diabetes mellitus   . Coronary artery disease   . Hypertension     Medications:  Prescriptions prior to admission  Medication Sig Dispense Refill  . acetaminophen (TYLENOL 8 HOUR) 650 MG CR tablet Take 1,300 mg by mouth 2 (two) times daily as needed. For pain       . aspirin 325 MG tablet Take 325 mg by mouth daily.        Marland Kitchen azelastine (OPTIVAR) 0.05 % ophthalmic solution Place 1 drop into both eyes daily as needed. To affected eyes        . cholecalciferol (VITAMIN D) 1000 UNITS tablet Take 1,000 Units by mouth daily.        . clobetasol ointment (TEMOVATE) 0.05 % Apply 1  application topically 2 (two) times daily as needed. To affected area; do not use TAZORAC       . diltiazem (CARDIZEM CD) 300 MG 24 hr capsule Take 300 mg by mouth daily.        Marland Kitchen doxazosin (CARDURA) 4 MG tablet Take 4 mg by mouth daily.        . ferrous sulfate 325 (65 FE) MG tablet Take 325 mg by mouth 3 (three) times daily with meals.        . furosemide (LASIX) 20 MG tablet Take 20 mg by mouth daily.        Marland Kitchen HYDROcodone-acetaminophen (VICODIN) 5-500 MG per tablet Take 1 tablet by mouth every 6 (six) hours as needed. For pain       . insulin glargine (LANTUS) 100 UNIT/ML injection Inject 30 Units into the skin every morning.        . latanoprost (XALATAN) 0.005 % ophthalmic solution Place 1 drop into both eyes daily as needed. For pressure       . lisinopril (PRINIVIL,ZESTRIL) 20 MG tablet Take 30 mg by mouth 2 (two) times daily.        . metFORMIN (GLUCOPHAGE) 1000 MG tablet Take 1,000 mg by mouth 2 (two) times daily with a meal.        . niacin (NIASPAN) 500 MG CR tablet Take 500 mg by mouth daily.        . nitroGLYCERIN (NITROSTAT) 0.4 MG SL tablet Place 0.4 mg under the tongue every 5 (five) minutes as  needed. For chest pain       . Omega-3 Fatty Acids (FISH OIL) 300 MG CAPS Take 300 mg by mouth daily.        . pantoprazole (PROTONIX) 40 MG tablet Take 40 mg by mouth daily.        Bertram Gala Glycol-Propyl Glycol (SYSTANE OP) Apply 1 drop to eye daily.        . prasugrel (EFFIENT) 10 MG TABS Take 10 mg by mouth daily.        . rosuvastatin (CRESTOR) 10 MG tablet Take 10 mg by mouth daily.        Marland Kitchen triamcinolone (NASACORT AQ) 55 MCG/ACT nasal inhaler Place 2 sprays into the nose as needed. For congestion        . DISCONTD: amoxicillin-clavulanate (AUGMENTIN) 875-125 MG per tablet Take 1 tablet by mouth 2 (two) times daily.         Assessment: 70 y.o. Male admitted today 05/28/11 with Cellulitis of the left lower extremity. Pt has failed to respond to outpatient PO antibiotics  (Augmentin). Scr  1.4 on 05/02/11. Estimated CrCl ~ 47.6 ml/min.  Scr today is pending.  Goal of Therapy:  Vancomycin trough level 10-15 mcg/ml  Plan:  Vancomycin 1250 mg IV q24h.    Arman Filter 05/28/2011,6:04 PM

## 2011-05-29 LAB — GLUCOSE, CAPILLARY
Glucose-Capillary: 207 mg/dL — ABNORMAL HIGH (ref 70–99)
Glucose-Capillary: 224 mg/dL — ABNORMAL HIGH (ref 70–99)

## 2011-05-29 LAB — COMPREHENSIVE METABOLIC PANEL
Albumin: 2.4 g/dL — ABNORMAL LOW (ref 3.5–5.2)
Alkaline Phosphatase: 73 U/L (ref 39–117)
BUN: 21 mg/dL (ref 6–23)
Calcium: 9.2 mg/dL (ref 8.4–10.5)
Potassium: 3.4 mEq/L — ABNORMAL LOW (ref 3.5–5.1)
Sodium: 137 mEq/L (ref 135–145)
Total Protein: 6.6 g/dL (ref 6.0–8.3)

## 2011-05-29 LAB — CBC
HCT: 33.8 % — ABNORMAL LOW (ref 39.0–52.0)
Hemoglobin: 10.8 g/dL — ABNORMAL LOW (ref 13.0–17.0)
MCV: 91.8 fL (ref 78.0–100.0)
RDW: 13.6 % (ref 11.5–15.5)
WBC: 9.5 10*3/uL (ref 4.0–10.5)

## 2011-05-29 MED ORDER — DIPHENHYDRAMINE HCL 25 MG PO CAPS
25.0000 mg | ORAL_CAPSULE | Freq: Four times a day (QID) | ORAL | Status: DC | PRN
  Administered 2011-05-29 – 2011-05-30 (×3): 25 mg via ORAL
  Filled 2011-05-29 (×2): qty 1

## 2011-05-29 MED ORDER — DEXTROSE 5 % IV SOLN
1.0000 g | INTRAVENOUS | Status: DC
  Administered 2011-05-29 – 2011-05-30 (×2): 1 g via INTRAVENOUS
  Filled 2011-05-29 (×2): qty 10

## 2011-05-29 NOTE — Progress Notes (Signed)
Patient ID: Ethan Vargas, male   DOB: [birth year], 71 y.o.   MRN: [id number] Subjective: Patient seen. Complain about pain left lower leg.  Objective: Weight change:   Intake/Output Summary (Last 24 hours) at 05/29/11 1518 Last data filed at 05/29/11 1300  Gross per 24 hour  Intake   1600 ml  Output      0 ml  Net   1600 ml   BP 125/75  Pulse 73  Temp(Src) 98.9 F (37.2 C) (Oral)  Resp 18  Ht 5' 7.25" (1.708 m)  Wt 72.576 kg (160 lb)  BMI 24.87 kg/m2  SpO2 96% Physical Exam: General appearance: alert, cooperative and no distress Head: Normocephalic, without obvious abnormality, atraumatic Neck: no adenopathy, no carotid bruit, no JVD, supple, symmetrical, trachea midline and thyroid not enlarged, symmetric, no tenderness/mass/nodules Lungs: clear to auscultation bilaterally Heart: regular rate and rhythm, S1, S2 normal, no murmur, click, rub or gallop Abdomen: soft, non-tender; bowel sounds normal; no masses,  no organomegaly Extremities: Erythema left lower leg with irregular margins. Skin: Skin color, texture, turgor normal. No rashes or lesions  Lab Results: Results for orders placed during the hospital encounter of 05/28/11 (from the past 48 hour(s))  GLUCOSE, CAPILLARY     Status: Abnormal   Collection Time   05/28/11  2:34 PM      Component Value Range Comment   Glucose-Capillary 295 (*) 70 - 99 (mg/dL)    Comment 1 Documented in Chart      Comment 2 Notify RN     GLUCOSE, CAPILLARY     Status: Abnormal   Collection Time   05/28/11  4:11 PM      Component Value Range Comment   Glucose-Capillary 307 (*) 70 - 99 (mg/dL)   CBC     Status: Abnormal   Collection Time   05/28/11  5:31 PM      Component Value Range Comment   WBC 7.9  4.0 - 10.5 (K/uL)    RBC 3.72 (*) 4.22 - 5.81 (MIL/uL)    Hemoglobin 10.9 (*) 13.0 - 17.0 (g/dL)    HCT 91.4 (*) 78.2 - 52.0 (%)    MCV 90.6  78.0 - 100.0 (fL)    MCH 29.3  26.0 - 34.0 (pg)    MCHC 32.3  30.0 - 36.0 (g/dL)    RDW 95.6  21.3 - 08.6 (%)    Platelets 125 (*) 150 - 400 (K/uL)   COMPREHENSIVE METABOLIC PANEL     Status: Abnormal   Collection Time   05/28/11  5:31 PM      Component Value Range Comment   Sodium 137  135 - 145 (mEq/L)    Potassium 3.9  3.5 - 5.1 (mEq/L)    Chloride 102  96 - 112 (mEq/L)    CO2 26  19 - 32 (mEq/L)    Glucose, Bld 296 (*) 70 - 99 (mg/dL)    BUN 19  6 - 23 (mg/dL)    Creatinine, Ser 5.78  0.50 - 1.35 (mg/dL)    Calcium 9.0  8.4 - 10.5 (mg/dL)    Total Protein 6.3  6.0 - 8.3 (g/dL)    Albumin 2.4 (*) 3.5 - 5.2 (g/dL)    AST 21  0 - 37 (U/L)    ALT 23  0 - 53 (U/L)    Alkaline Phosphatase 68  39 - 117 (U/L)    Total Bilirubin 0.4  0.3 - 1.2 (mg/dL)    GFR calc non Af Denyse Dago  56 (*) >90 (mL/min)    GFR calc Af Amer 65 (*) >90 (mL/min)   DIFFERENTIAL     Status: Abnormal   Collection Time   05/28/11  5:31 PM      Component Value Range Comment   Neutrophils Relative 79 (*) 43 - 77 (%)    Neutro Abs 6.2  1.7 - 7.7 (K/uL)    Lymphocytes Relative 13  12 - 46 (%)    Lymphs Abs 1.0  0.7 - 4.0 (K/uL)    Monocytes Relative 7  3 - 12 (%)    Monocytes Absolute 0.5  0.1 - 1.0 (K/uL)    Eosinophils Relative 2  0 - 5 (%)    Eosinophils Absolute 0.1  0.0 - 0.7 (K/uL)    Basophils Relative 0  0 - 1 (%)    Basophils Absolute 0.0  0.0 - 0.1 (K/uL)   GLUCOSE, CAPILLARY     Status: Abnormal   Collection Time   05/28/11  9:08 PM      Component Value Range Comment   Glucose-Capillary 317 (*) 70 - 99 (mg/dL)   COMPREHENSIVE METABOLIC PANEL     Status: Abnormal   Collection Time   05/29/11  6:06 AM      Component Value Range Comment   Sodium 137  135 - 145 (mEq/L)    Potassium 3.4 (*) 3.5 - 5.1 (mEq/L)    Chloride 101  96 - 112 (mEq/L)    CO2 23  19 - 32 (mEq/L)    Glucose, Bld 224 (*) 70 - 99 (mg/dL)    BUN 21  6 - 23 (mg/dL)    Creatinine, Ser 4.54 (*) 0.50 - 1.35 (mg/dL)    Calcium 9.2  8.4 - 10.5 (mg/dL)    Total Protein 6.6  6.0 - 8.3 (g/dL)    Albumin 2.4 (*) 3.5 - 5.2  (g/dL)    AST 20  0 - 37 (U/L)    ALT 24  0 - 53 (U/L)    Alkaline Phosphatase 73  39 - 117 (U/L)    Total Bilirubin 0.4  0.3 - 1.2 (mg/dL)    GFR calc non Af Amer 50 (*) >90 (mL/min)    GFR calc Af Amer 58 (*) >90 (mL/min)   CBC     Status: Abnormal   Collection Time   05/29/11  6:06 AM      Component Value Range Comment   WBC 9.5  4.0 - 10.5 (K/uL)    RBC 3.68 (*) 4.22 - 5.81 (MIL/uL)    Hemoglobin 10.8 (*) 13.0 - 17.0 (g/dL)    HCT 09.8 (*) 11.9 - 52.0 (%)    MCV 91.8  78.0 - 100.0 (fL)    MCH 29.3  26.0 - 34.0 (pg)    MCHC 32.0  30.0 - 36.0 (g/dL)    RDW 14.7  82.9 - 56.2 (%)    Platelets 144 (*) 150 - 400 (K/uL)   GLUCOSE, CAPILLARY     Status: Abnormal   Collection Time   05/29/11  6:15 AM      Component Value Range Comment   Glucose-Capillary 207 (*) 70 - 99 (mg/dL)   GLUCOSE, CAPILLARY     Status: Abnormal   Collection Time   05/29/11 11:16 AM      Component Value Range Comment   Glucose-Capillary 224 (*) 70 - 99 (mg/dL)     Micro Results: No results found for this or any previous visit (from the past 240 hour(s)).  Studies/Results: US Venous Img Lower Unilateral Left  05/24/2011  *RADIOLOGY REPORT*  Clinical Data: Left leg pain/swelling  LEFT LOWER EXTREMITY VENOUS DUPLEX ULTRASOUND  Technique:  Gray-scale sonography with graded compression, as well as color Doppler and duplex ultrasound, were performed to evaluate the deep venous system of the lower extremity from the level of the common femoral vein through the popliteal and proximal calf veins. Spectral Doppler was utilized to evaluate flow at rest and with distal augmentation maneuvers.  Comparison:  None.  Findings: Visualized left lower extremity deep venous system appears patent.  Normal compressibility.  Patent color Doppler flow.  Satisfactory spectral Doppler with respiratory variation and response to augmentation.  Subcutaneous edema in the calf.  IMPRESSION: No deep venous thrombosis in the visualized left lower  extremity.  Subcutaneous edema in the calf.  Original Report Authenticated By: Charline Bills, M.D.   Medications: Scheduled Meds:   . aspirin  325 mg Oral Daily  . cholecalciferol  1,000 Units Oral Daily  . clindamycin (CLEOCIN) IV  900 mg Intravenous Q8H  . diltiazem  300 mg Oral Daily  . doxazosin  4 mg Oral Daily  . ferrous sulfate  325 mg Oral TID WC  . Flora-Q  1 capsule Oral Daily  . fluticasone  2 spray Each Nare Daily  . furosemide  20 mg Oral Daily  . heparin  5,000 Units Subcutaneous Q8H  . insulin aspart  0-9 Units Subcutaneous TID WC  . insulin glargine  30 Units Subcutaneous Q0700  . latanoprost  1 drop Both Eyes Daily  . lisinopril  30 mg Oral BID  . metFORMIN  1,000 mg Oral BID WC  . niacin  500 mg Oral QHS  . omega-3 acid ethyl esters  1 g Oral Daily  . pantoprazole  40 mg Oral Daily  . polyvinyl alcohol  1 drop Both Eyes BID  . prasugrel  10 mg Oral Daily  . rosuvastatin  10 mg Oral q1800  . sodium chloride  3 mL Intravenous Q12H  . vancomycin  1,250 mg Intravenous Q24H  . DISCONTD: metFORMIN  1,000 mg Oral BID WC  . DISCONTD: niacin  500 mg Oral Daily  . DISCONTD: Polyethyl Glycol-Propyl Glycol  1 drop Both Eyes BID   Continuous Infusions:  PRN Meds:.sodium chloride, acetaminophen, acetaminophen, clobetasol ointment, diphenhydrAMINE, HYDROcodone-acetaminophen, HYDROmorphone, nitroGLYCERIN, olopatadine, sodium chloride, DISCONTD: azelastine  Assessment/Plan: #1 Cellulitis and abscess of leg-we DC IV clindamycin and start IV Rocephin in addition to vancomycin.  #2 Mixed hyperlipidemia-continue Niacin #3 PERIPHERAL NEUROPATHY  #4 HYPERTENSION-blood pressure fairly stable  #5 CORONARY ATHEROSCLEROSIS NATIVE CORONARY ARTERY-we continue prasugrel #6 UNSPECIFIED SECONDARY CARDIOMYOPATHY-patient not decompensating  #7 BARRETTS ESOPHAGUS-we continue Protonix. However patient will need yearly surveillance upper endoscopy  #8 Diabetes mellitus type 2-to continue  Lantus insulin alongside with her regular insulin sliding scale.   LOS: 1 day   Pollyann Roa 05/29/2011, 3:18 PM

## 2011-05-30 DIAGNOSIS — L02419 Cutaneous abscess of limb, unspecified: Secondary | ICD-10-CM

## 2011-05-30 DIAGNOSIS — L03119 Cellulitis of unspecified part of limb: Secondary | ICD-10-CM

## 2011-05-30 DIAGNOSIS — M79609 Pain in unspecified limb: Secondary | ICD-10-CM

## 2011-05-30 LAB — COMPREHENSIVE METABOLIC PANEL
CO2: 24 mEq/L (ref 19–32)
Calcium: 8.7 mg/dL (ref 8.4–10.5)
Creatinine, Ser: 1.63 mg/dL — ABNORMAL HIGH (ref 0.50–1.35)
GFR calc Af Amer: 48 mL/min — ABNORMAL LOW (ref >90)
GFR calc non Af Amer: 42 mL/min — ABNORMAL LOW (ref >90)
Glucose, Bld: 241 mg/dL — ABNORMAL HIGH (ref 70–99)
Sodium: 134 mEq/L — ABNORMAL LOW (ref 135–145)
Total Protein: 6.4 g/dL (ref 6.0–8.3)

## 2011-05-30 LAB — CBC
Hemoglobin: 9.9 g/dL — ABNORMAL LOW (ref 13.0–17.0)
MCH: 29.4 pg (ref 26.0–34.0)
MCHC: 32.2 g/dL (ref 30.0–36.0)
MCV: 91.1 fL (ref 78.0–100.0)
Platelets: 149 10*3/uL — ABNORMAL LOW (ref 150–400)
RBC: 3.37 MIL/uL — ABNORMAL LOW (ref 4.22–5.81)

## 2011-05-30 LAB — GLUCOSE, CAPILLARY
Glucose-Capillary: 233 mg/dL — ABNORMAL HIGH (ref 70–99)
Glucose-Capillary: 218 mg/dL — ABNORMAL HIGH (ref 70–99)

## 2011-05-30 MED ORDER — INSULIN GLARGINE 100 UNIT/ML ~~LOC~~ SOLN
35.0000 [IU] | SUBCUTANEOUS | Status: DC
  Administered 2011-05-31 – 2011-06-01 (×2): 35 [IU] via SUBCUTANEOUS

## 2011-05-30 NOTE — Progress Notes (Signed)
Inpatient Diabetes Program Recommendations  AACE/ADA: New Consensus Statement on Inpatient Glycemic Control (2009)  Target Ranges:  Prepandial:   less than 140 mg/dL      Peak postprandial:   less than 180 mg/dL (1-2 hours)      Critically ill patients:  140 - 180 mg/dL   Reason: CBGs 086, 578  Inpatient Diabetes Program Recommendations Insulin - Basal: Increase Lantus to 35 units HgbA1C: check A1C to assess pre-hospital glucose control

## 2011-05-30 NOTE — Progress Notes (Signed)
05/30/2011 Ethan Vargas Case Management Note [phone]  Utilization review completed.  

## 2011-05-30 NOTE — Progress Notes (Signed)
*  PRELIMINARY RESULTS* Left:  No evidence of deep venous thrombosis or Baker's cyst.  Ethan Vargas 05/30/2011, 5:16 PM

## 2011-05-30 NOTE — Progress Notes (Signed)
PHARMACIST - PHYSICIAN COMMUNICATION DR:  Betti Cruz CONCERNING:  METFORMIN SAFE ADMINISTRATION POLICY  RECOMMENDATION: Metformin has been placed on DISCONTINUE (rejected order) STATUS and should be reordered only after any of the conditions below are ruled out.  Current safety recommendations include avoiding metformin for a minimum of 48 hours after the patient's exposure to intravenous contrast media.  DESCRIPTION:  The Pharmacy Committee has adopted a policy that restricts the use of metformin in hospitalized patients until all the contraindications to administration have been ruled out. Specific contraindications are: [x]  Serum creatinine ? 1.5 for males []  Serum creatinine ? 1.4 for females []  Shock, acute MI, sepsis, hypoxemia, dehydration []  Planned administration of intravenous iodinated contrast media []  Heart Failure patients with low EF []  Acute or chronic metabolic acidosis (including DKA)

## 2011-05-30 NOTE — Consult Note (Signed)
Infectious Diseases Initial Consultation        Total days of antibiotics 6        Day 3 vancomycin         Day 2 ceftriaxone         Date of Admission:  05/28/2011  Date of Consult:  05/30/2011  Reason for Consult: Left leg cellulitis Referring Physician: Dr. Andreas Blower   Problem List:  Principal Problem:  *Cellulitis and abscess of leg, except foot Active Problems:  Renal insufficiency  Mixed hyperlipidemia  PERIPHERAL NEUROPATHY  HYPERTENSION  CORONARY ATHEROSCLEROSIS NATIVE CORONARY ARTERY  UNSPECIFIED SECONDARY CARDIOMYOPATHY  BARRETTS ESOPHAGUS  Lumbago  Diabetes mellitus type 2 with complications, uncontrolled   Recommendations: 1.  Continue vancomycin 2. Discontinue ceftriaxone   Assessment:  Ethan Vargas has moderately severe cellulitis in his left leg. His acute on chronic lymphedema related to his vein graft harvesting is his major risk factor. He is beginning to improve slowly as expected. His infection is probably due to strep or staph bacteria. I feel it is best that we treated with vancomycin alone for now and consider switching back to an oral regimen once he is feeling better and able to ambulate safely and comfortably. I suspect that his leg looks worse than usual case of cellulitis because he is on an antiplatelet agent.    HPI: Ethan Vargas is a 71 y.o. male  who awoke with severe left lateral ankle pain, redness and swelling on December 23. He thought he was having a flare of his gout and went to his primary care physician who prescribed a prednisone Dosepak. The following day his pain was much improved but on Christmas day the pain worsened again and he had dramatic redness from his ankle to his knee. He was seen again and started on Augmentin on December 27 but continued to have severe pain, redness and swelling of his left leg and was admitted on December 31. Over the past 2 days noticed some slight improvement in his pain and this morning before  getting up to go to the bathroom felt like the redness on his upper calf was receding.  He has some chronic edema of his left ankle and foot since his vein graft harvesting for his coronary artery bypass grafting surgery many years ago. His wife notes that the swelling of his ankle has been much worse over the last month. He has never had a bout of left leg cellulitis before.   Review of Systems: Constitutional: positive for anorexia and fatigue, negative for chills, fevers and sweats     . aspirin  325 mg Oral Daily  . cefTRIAXone (ROCEPHIN)  IV  1 g Intravenous Q24H  . cholecalciferol  1,000 Units Oral Daily  . diltiazem  300 mg Oral Daily  . doxazosin  4 mg Oral Daily  . ferrous sulfate  325 mg Oral TID WC  . Flora-Q  1 capsule Oral Daily  . fluticasone  2 spray Each Nare Daily  . furosemide  20 mg Oral Daily  . heparin  5,000 Units Subcutaneous Q8H  . insulin aspart  0-9 Units Subcutaneous TID WC  . insulin glargine  35 Units Subcutaneous Q0700  . latanoprost  1 drop Both Eyes Daily  . lisinopril  30 mg Oral BID  . niacin  500 mg Oral QHS  . omega-3 acid ethyl esters  1 g Oral Daily  . pantoprazole  40 mg Oral Daily  . polyvinyl alcohol  1  drop Both Eyes BID  . prasugrel  10 mg Oral Daily  . rosuvastatin  10 mg Oral q1800  . sodium chloride  3 mL Intravenous Q12H  . vancomycin  1,250 mg Intravenous Q24H  . DISCONTD: insulin glargine  30 Units Subcutaneous Q0700  . DISCONTD: metFORMIN  1,000 mg Oral BID WC    Past Medical History  Diagnosis Date  . Myocardial infarction   . Sleep apnea   . Diabetes mellitus   . Coronary artery disease   . Hypertension     History  Substance Use Topics  . Smoking status: Not on file  . Smokeless tobacco: Not on file  . Alcohol Use: No    No family history on file. Allergies  Allergen Reactions  . Mercury Swelling  . Clopidogrel Bisulfate     REACTION: rash  . Contrast Media (Iodinated Diagnostic Agents) Swelling     Premedication with prednisone  . Iodine Swelling  . Latex     Oral nausea  . Sulfonamide Derivatives     unknown    OBJECTIVE: Blood pressure 129/62, pulse 66, temperature 98.8 F (37.1 C), temperature source Oral, resp. rate 16, height 5' 7.25" (1.708 m), weight 72.576 kg (160 lb), SpO2 98.00%. General:  He is a little bit groggy from his pain medication Skin:  He has extensive ecchymoses, petechiae and some blanching redness from his left foot up to the level of the knee. It is slightly warm to touch and mildly swollen. He does note that it's much less painful to touch than it was 2 days ago. Lungs:  Clear Cor:  He has a healed sternal incision. Regular S1 and S2 no murmurs Abdomen:  Soft and nontender    Results for orders placed during the hospital encounter of 05/28/11 (from the past 48 hour(s))  CULTURE, BLOOD (ROUTINE X 2)     Status: Normal (Preliminary result)   Collection Time   05/28/11  5:40 PM      Component Value Range Comment   Specimen Description BLOOD LEFT ARM      Special Requests BOTTLES DRAWN AEROBIC ONLY 5CC      Setup Time [id number]      Culture        Value:        BLOOD CULTURE RECEIVED NO GROWTH TO DATE CULTURE WILL BE HELD FOR 5 DAYS BEFORE ISSUING A FINAL NEGATIVE REPORT   Report Status PENDING     GLUCOSE, CAPILLARY     Status: Abnormal   Collection Time   05/28/11  9:08 PM      Component Value Range Comment   Glucose-Capillary 317 (*) 70 - 99 (mg/dL)   COMPREHENSIVE METABOLIC PANEL     Status: Abnormal   Collection Time   05/29/11  6:06 AM      Component Value Range Comment   Sodium 137  135 - 145 (mEq/L)    Potassium 3.4 (*) 3.5 - 5.1 (mEq/L)    Chloride 101  96 - 112 (mEq/L)    CO2 23  19 - 32 (mEq/L)    Glucose, Bld 224 (*) 70 - 99 (mg/dL)    BUN 21  6 - 23 (mg/dL)    Creatinine, Ser 4.69 (*) 0.50 - 1.35 (mg/dL)    Calcium 9.2  8.4 - 10.5 (mg/dL)    Total Protein 6.6  6.0 - 8.3 (g/dL)    Albumin 2.4 (*) 3.5 - 5.2 (g/dL)    AST 20  0 -  37 (  U/L)    ALT 24  0 - 53 (U/L)    Alkaline Phosphatase 73  39 - 117 (U/L)    Total Bilirubin 0.4  0.3 - 1.2 (mg/dL)    GFR calc non Af Amer 50 (*) >90 (mL/min)    GFR calc Af Amer 58 (*) >90 (mL/min)   CBC     Status: Abnormal   Collection Time   05/29/11  6:06 AM      Component Value Range Comment   WBC 9.5  4.0 - 10.5 (K/uL)    RBC 3.68 (*) 4.22 - 5.81 (MIL/uL)    Hemoglobin 10.8 (*) 13.0 - 17.0 (g/dL)    HCT 16.1 (*) 09.6 - 52.0 (%)    MCV 91.8  78.0 - 100.0 (fL)    MCH 29.3  26.0 - 34.0 (pg)    MCHC 32.0  30.0 - 36.0 (g/dL)    RDW 04.5  40.9 - 81.1 (%)    Platelets 144 (*) 150 - 400 (K/uL)   GLUCOSE, CAPILLARY     Status: Abnormal   Collection Time   05/29/11  6:15 AM      Component Value Range Comment   Glucose-Capillary 207 (*) 70 - 99 (mg/dL)   GLUCOSE, CAPILLARY     Status: Abnormal   Collection Time   05/29/11 11:16 AM      Component Value Range Comment   Glucose-Capillary 224 (*) 70 - 99 (mg/dL)   GLUCOSE, CAPILLARY     Status: Abnormal   Collection Time   05/29/11  4:10 PM      Component Value Range Comment   Glucose-Capillary 258 (*) 70 - 99 (mg/dL)   GLUCOSE, CAPILLARY     Status: Abnormal   Collection Time   05/29/11  9:01 PM      Component Value Range Comment   Glucose-Capillary 222 (*) 70 - 99 (mg/dL)   CBC     Status: Abnormal   Collection Time   05/30/11  7:10 AM      Component Value Range Comment   WBC 8.5  4.0 - 10.5 (K/uL)    RBC 3.37 (*) 4.22 - 5.81 (MIL/uL)    Hemoglobin 9.9 (*) 13.0 - 17.0 (g/dL)    HCT 91.4 (*) 78.2 - 52.0 (%)    MCV 91.1  78.0 - 100.0 (fL)    MCH 29.4  26.0 - 34.0 (pg)    MCHC 32.2  30.0 - 36.0 (g/dL)    RDW 95.6  21.3 - 08.6 (%)    Platelets 149 (*) 150 - 400 (K/uL)   COMPREHENSIVE METABOLIC PANEL     Status: Abnormal   Collection Time   05/30/11  7:10 AM      Component Value Range Comment   Sodium 134 (*) 135 - 145 (mEq/L)    Potassium 3.6  3.5 - 5.1 (mEq/L)    Chloride 99  96 - 112 (mEq/L)    CO2 24  19 - 32 (mEq/L)     Glucose, Bld 241 (*) 70 - 99 (mg/dL)    BUN 20  6 - 23 (mg/dL)    Creatinine, Ser 5.78 (*) 0.50 - 1.35 (mg/dL)    Calcium 8.7  8.4 - 10.5 (mg/dL)    Total Protein 6.4  6.0 - 8.3 (g/dL)    Albumin 2.3 (*) 3.5 - 5.2 (g/dL)    AST 22  0 - 37 (U/L)    ALT 26  0 - 53 (U/L)    Alkaline Phosphatase 61  39 - 117 (U/L)    Total Bilirubin 0.3  0.3 - 1.2 (mg/dL)    GFR calc non Af Amer 42 (*) >90 (mL/min)    GFR calc Af Amer 48 (*) >90 (mL/min)   GLUCOSE, CAPILLARY     Status: Abnormal   Collection Time   05/30/11  7:18 AM      Component Value Range Comment   Glucose-Capillary 233 (*) 70 - 99 (mg/dL)   GLUCOSE, CAPILLARY     Status: Abnormal   Collection Time   05/30/11 11:21 AM      Component Value Range Comment   Glucose-Capillary 218 (*) 70 - 99 (mg/dL)       Component Value Date/Time   SDES BLOOD LEFT ARM 05/28/2011 1740   SPECREQUEST BOTTLES DRAWN AEROBIC ONLY 5CC 05/28/2011 1740   CULT        BLOOD CULTURE RECEIVED NO GROWTH TO DATE CULTURE WILL BE HELD FOR 5 DAYS BEFORE ISSUING A FINAL NEGATIVE REPORT 05/28/2011 1740   REPTSTATUS PENDING 05/28/2011 1740   No results found.  Cliffton Asters, MD [facility] [phone] pager   [phone] cell 05/30/2011, 5:38 PM

## 2011-05-30 NOTE — Progress Notes (Signed)
Subjective: Still having pain and erythema in the left lower extremity.  Minimal improvement.  Objective: Vital signs in last 24 hours: Filed Vitals:   05/29/11 0541 05/29/11 1416 05/29/11 2200 05/30/11 0655  BP: 112/57 125/75 116/80 129/62  Pulse: 68 73 71 66  Temp: 98.5 F (36.9 C) 98.9 F (37.2 C) 97.6 F (36.4 C) 98.8 F (37.1 C)  TempSrc: Oral Oral Oral   Resp: 18 18 18 16   Height:      Weight:      SpO2: 97% 96% 96% 98%   Weight change:   Intake/Output Summary (Last 24 hours) at 05/30/11 1708 Last data filed at 05/30/11 0700  Gross per 24 hour  Intake    290 ml  Output      0 ml  Net    290 ml    Physical Exam: General: Awake, Oriented, No acute distress. HEENT: EOMI. Neck: Supple CV: S1 and S2 Lungs: Clear to ascultation bilaterally Abdomen: Soft, Nontender, Nondistended, +bowel sounds. Ext: Good pulses. Trace edema.  Erythema and ecchymosis below left knee extends all the way to the ankle and foot.  Lab Results:  [facility] 05/30/11 0710 05/29/11 0606  NA 134* 137  K 3.6 3.4*  CL 99 101  CO2 24 23  GLUCOSE 241* 224*  BUN 20 21  CREATININE 1.63* 1.41*  CALCIUM 8.7 9.2  MG -- --  PHOS -- --    Basename 05/30/11 0710 05/29/11 0606  AST 22 20  ALT 26 24  ALKPHOS 61 73  BILITOT 0.3 0.4  PROT 6.4 6.6  ALBUMIN 2.3* 2.4*   No results found for this basename: LIPASE:2,AMYLASE:2 in the last 72 hours  Basename 05/30/11 0710 05/29/11 0606 05/28/11 1731  WBC 8.5 9.5 --  NEUTROABS -- -- 6.2  HGB 9.9* 10.8* --  HCT 30.7* 33.8* --  MCV 91.1 91.8 --  PLT 149* 144* --   No results found for this basename: CKTOTAL:3,CKMB:3,CKMBINDEX:3,TROPONINI:3 in the last 72 hours No components found with this basename: POCBNP:3 No results found for this basename: DDIMER:2 in the last 72 hours No results found for this basename: HGBA1C:2 in the last 72 hours No results found for this basename: CHOL:2,HDL:2,LDLCALC:2,TRIG:2,CHOLHDL:2,LDLDIRECT:2 in the last 72  hours No results found for this basename: TSH,T4TOTAL,FREET3,T3FREE,THYROIDAB in the last 72 hours No results found for this basename: VITAMINB12:2,FOLATE:2,FERRITIN:2,TIBC:2,IRON:2,RETICCTPCT:2 in the last 72 hours  Micro Results: Recent Results (from the past 240 hour(s))  CULTURE, BLOOD (ROUTINE X 2)     Status: Normal (Preliminary result)   Collection Time   05/28/11  5:30 PM      Component Value Range Status Comment   Specimen Description BLOOD LEFT ARM   Final    Special Requests     Final    Value: BOTTLES DRAWN AEROBIC AND ANAEROBIC 10CC AER 7CC ANA   Setup Time [id number]   Final    Culture     Final    Value:        BLOOD CULTURE RECEIVED NO GROWTH TO DATE CULTURE WILL BE HELD FOR 5 DAYS BEFORE ISSUING A FINAL NEGATIVE REPORT   Report Status PENDING   Incomplete   CULTURE, BLOOD (ROUTINE X 2)     Status: Normal (Preliminary result)   Collection Time   05/28/11  5:40 PM      Component Value Range Status Comment   Specimen Description BLOOD LEFT ARM   Final    Special Requests BOTTLES DRAWN AEROBIC ONLY 5CC   Final  Setup Time [id number]   Final    Culture     Final    Value:        BLOOD CULTURE RECEIVED NO GROWTH TO DATE CULTURE WILL BE HELD FOR 5 DAYS BEFORE ISSUING A FINAL NEGATIVE REPORT   Report Status PENDING   Incomplete     Studies/Results: No results found.  Medications: I have reviewed the patient's current medications. Scheduled Meds:   . aspirin  325 mg Oral Daily  . cefTRIAXone (ROCEPHIN)  IV  1 g Intravenous Q24H  . cholecalciferol  1,000 Units Oral Daily  . diltiazem  300 mg Oral Daily  . doxazosin  4 mg Oral Daily  . ferrous sulfate  325 mg Oral TID WC  . Flora-Q  1 capsule Oral Daily  . fluticasone  2 spray Each Nare Daily  . furosemide  20 mg Oral Daily  . heparin  5,000 Units Subcutaneous Q8H  . insulin aspart  0-9 Units Subcutaneous TID WC  . insulin glargine  35 Units Subcutaneous Q0700  . latanoprost  1 drop Both Eyes Daily  .  lisinopril  30 mg Oral BID  . niacin  500 mg Oral QHS  . omega-3 acid ethyl esters  1 g Oral Daily  . pantoprazole  40 mg Oral Daily  . polyvinyl alcohol  1 drop Both Eyes BID  . prasugrel  10 mg Oral Daily  . rosuvastatin  10 mg Oral q1800  . sodium chloride  3 mL Intravenous Q12H  . vancomycin  1,250 mg Intravenous Q24H  . DISCONTD: insulin glargine  30 Units Subcutaneous Q0700  . DISCONTD: metFORMIN  1,000 mg Oral BID WC   Continuous Infusions:  PRN Meds:.sodium chloride, acetaminophen, acetaminophen, clobetasol ointment, diphenhydrAMINE, HYDROcodone-acetaminophen, HYDROmorphone, nitroGLYCERIN, olopatadine, sodium chloride  Assessment/Plan: 1. Cellulitis and abscess of leg.  Continue vancomycin and ceftriaxone.  Will get repeat lower extremity Dopplers to evaluate for DVT or abscess.  Patient had lower extremity Dopplers done on 05/24/2011 which was negative at that time.  Given the impressive cellulitis will get infectious diseases consult for antibiotic recommendations.  2. Mixed hyperlipidemia.  Continue rosuvastatin and niacin.  3.  Peripheral neuropathy.  Stable.  4.  Hypertension.  Stable.  Continue lisinopril, furosemide, and diltiazem.  5.  Chronic kidney disease stage II/III. Baseline creatinine is around 1.4.  Creatinine has trended up to 1.63 today.  If continues to trend up will likely need to discontinue furosemide and lisinopril.  6.  History of coronary artery disease.  Stable.  Continue aspirin.  7.  History of Barrett's esophagus.  Continue PPI.  8.  Diabetes mellitus type 2 with complications, uncontrolled.  Increase patient's dose of Lantus.  Continue sliding scale insulin.  9.  Prophylaxis.  Subcutaneous heparin.  10.  Disposition.  Pending.   LOS: 2 days  Johnae Friley A, MD 05/30/2011, 5:08 PM

## 2011-05-31 DIAGNOSIS — L02419 Cutaneous abscess of limb, unspecified: Secondary | ICD-10-CM

## 2011-05-31 DIAGNOSIS — L03119 Cellulitis of unspecified part of limb: Secondary | ICD-10-CM

## 2011-05-31 LAB — BASIC METABOLIC PANEL
Calcium: 8.8 mg/dL (ref 8.4–10.5)
Creatinine, Ser: 1.78 mg/dL — ABNORMAL HIGH (ref 0.50–1.35)
GFR calc Af Amer: 43 mL/min — ABNORMAL LOW (ref >90)
GFR calc non Af Amer: 37 mL/min — ABNORMAL LOW (ref >90)
Sodium: 138 mEq/L (ref 135–145)

## 2011-05-31 LAB — GLUCOSE, CAPILLARY
Glucose-Capillary: 135 mg/dL — ABNORMAL HIGH (ref 70–99)
Glucose-Capillary: 120 mg/dL — ABNORMAL HIGH (ref 70–99)
Glucose-Capillary: 185 mg/dL — ABNORMAL HIGH (ref 70–99)

## 2011-05-31 MED ORDER — VANCOMYCIN HCL IN DEXTROSE 1-5 GM/200ML-% IV SOLN
1000.0000 mg | INTRAVENOUS | Status: DC
  Administered 2011-05-31: 1000 mg via INTRAVENOUS
  Filled 2011-05-31 (×2): qty 200

## 2011-05-31 NOTE — Progress Notes (Signed)
Patient ID: Ethan Vargas, male   DOB: 02-22-1941, 71 y.o.   MRN: [id number]  INFECTIOUS DISEASE PROGRESS NOTE    Date of Admission:  05/28/2011   Total days of antibiotics 7        Day 4         Principal Problem:  *Cellulitis and abscess of leg, except foot Active Problems:  Renal insufficiency  Mixed hyperlipidemia  PERIPHERAL NEUROPATHY  HYPERTENSION  CORONARY ATHEROSCLEROSIS NATIVE CORONARY ARTERY  UNSPECIFIED SECONDARY CARDIOMYOPATHY  BARRETTS ESOPHAGUS  Lumbago  Diabetes mellitus type 2 with complications, uncontrolled      . aspirin  325 mg Oral Daily  . cholecalciferol  1,000 Units Oral Daily  . diltiazem  300 mg Oral Daily  . doxazosin  4 mg Oral Daily  . ferrous sulfate  325 mg Oral TID WC  . Flora-Q  1 capsule Oral Daily  . fluticasone  2 spray Each Nare Daily  . heparin  5,000 Units Subcutaneous Q8H  . insulin aspart  0-9 Units Subcutaneous TID WC  . insulin glargine  35 Units Subcutaneous Q0700  . latanoprost  1 drop Both Eyes Daily  . niacin  500 mg Oral QHS  . omega-3 acid ethyl esters  1 g Oral Daily  . pantoprazole  40 mg Oral Daily  . polyvinyl alcohol  1 drop Both Eyes BID  . prasugrel  10 mg Oral Daily  . rosuvastatin  10 mg Oral q1800  . sodium chloride  3 mL Intravenous Q12H  . vancomycin  1,250 mg Intravenous Q24H  . DISCONTD: cefTRIAXone (ROCEPHIN)  IV  1 g Intravenous Q24H  . DISCONTD: furosemide  20 mg Oral Daily  . DISCONTD: insulin glargine  30 Units Subcutaneous Q0700  . DISCONTD: lisinopril  30 mg Oral BID    Subjective: He feels like the swelling in his left calf has improved and his pain is slightly better. He rates his current pain as 5/10  Objective: Temp:  [98.5 F (36.9 C)-98.7 F (37.1 C)] 98.6 F (37 C) (01/03 1300) Pulse Rate:  [78-83] 83  (01/03 1300) Resp:  [17-18] 18  (01/03 1300) BP: (121-143)/(66-72) 143/66 mmHg (01/03 1300) SpO2:  [95 %-99 %] 99 % (01/03 1300)  General: He is alert and comfortable visiting  with his wife and friends Skin: He continues to have a significant amount of ecchymoses over the dorsum of his left foot, ankle and lower leg. The redness below the knee has improved and his swelling has decreased   Lab Results Lab Results  Component Value Date   WBC 8.5 05/30/2011   HGB 9.9* 05/30/2011   HCT 30.7* 05/30/2011   MCV 91.1 05/30/2011   PLT 149* 05/30/2011    Lab Results  Component Value Date   CREATININE 1.78* 05/31/2011   BUN 20 05/31/2011   NA 138 05/31/2011   K 4.1 05/31/2011   CL 103 05/31/2011   CO2 17* 05/31/2011    Lab Results  Component Value Date   ALT 26 05/30/2011   AST 22 05/30/2011   ALKPHOS 61 05/30/2011   BILITOT 0.3 05/30/2011       Microbiology: Recent Results (from the past 240 hour(s))  CULTURE, BLOOD (ROUTINE X 2)     Status: Normal (Preliminary result)   Collection Time   05/28/11  5:30 PM      Component Value Range Status Comment   Specimen Description BLOOD LEFT ARM   Final    Special Requests  Final    Value: BOTTLES DRAWN AEROBIC AND ANAEROBIC 10CC AER 7CC ANA   Setup Time [id number]   Final    Culture     Final    Value:        BLOOD CULTURE RECEIVED NO GROWTH TO DATE CULTURE WILL BE HELD FOR 5 DAYS BEFORE ISSUING A FINAL NEGATIVE REPORT   Report Status PENDING   Incomplete   CULTURE, BLOOD (ROUTINE X 2)     Status: Normal (Preliminary result)   Collection Time   05/28/11  5:40 PM      Component Value Range Status Comment   Specimen Description BLOOD LEFT ARM   Final    Special Requests BOTTLES DRAWN AEROBIC ONLY 5CC   Final    Setup Time [id number]   Final    Culture     Final    Value:        BLOOD CULTURE RECEIVED NO GROWTH TO DATE CULTURE WILL BE HELD FOR 5 DAYS BEFORE ISSUING A FINAL NEGATIVE REPORT   Report Status PENDING   Incomplete     Studies/Results: No results found.   Assessment: His cellulitis is resolving slowly. I will continue vancomycin for now. Once he is able to get up and ambulate more comfortably he should be ready  for discharge.  Plan: 1. Continue vancomycin   Cliffton Asters, MD [facility] [phone] pager   [phone] cell 05/31/2011, 3:00 PM

## 2011-05-31 NOTE — Progress Notes (Signed)
Subjective: Had a fall this morning, reports that he landed softly on his behind.  Objective: Vital signs in last 24 hours: Filed Vitals:   05/30/11 0655 05/30/11 2209 05/31/11 0518 05/31/11 1300  BP: 129/62 121/69 122/72 143/66  Pulse: 66 78 78 83  Temp: 98.8 F (37.1 C) 98.7 F (37.1 C) 98.5 F (36.9 C) 98.6 F (37 C)  TempSrc:  Oral Oral Oral  Resp: 16 17 18 18   Height:      Weight:      SpO2: 98% 98% 95% 99%   Weight change:  No intake or output data in the 24 hours ending 05/31/11 1542  Physical Exam: General: Awake, Oriented, No acute distress. HEENT: EOMI. Neck: Supple CV: S1 and S2 Lungs: Clear to ascultation bilaterally Abdomen: Soft, Nontender, Nondistended, +bowel sounds. Ext: Good pulses. Trace edema.  Erythema and ecchymosis below left knee extends all the way to the ankle and foot.  Lab Results:  [facility] 05/31/11 0530 05/30/11 0710  NA 138 134*  K 4.1 3.6  CL 103 99  CO2 17* 24  GLUCOSE 85 241*  BUN 20 20  CREATININE 1.78* 1.63*  CALCIUM 8.8 8.7  MG -- --  PHOS -- --    Basename 05/30/11 0710 05/29/11 0606  AST 22 20  ALT 26 24  ALKPHOS 61 73  BILITOT 0.3 0.4  PROT 6.4 6.6  ALBUMIN 2.3* 2.4*   No results found for this basename: LIPASE:2,AMYLASE:2 in the last 72 hours  Basename 05/30/11 0710 05/29/11 0606 05/28/11 1731  WBC 8.5 9.5 --  NEUTROABS -- -- 6.2  HGB 9.9* 10.8* --  HCT 30.7* 33.8* --  MCV 91.1 91.8 --  PLT 149* 144* --   No results found for this basename: CKTOTAL:3,CKMB:3,CKMBINDEX:3,TROPONINI:3 in the last 72 hours No components found with this basename: POCBNP:3 No results found for this basename: DDIMER:2 in the last 72 hours No results found for this basename: HGBA1C:2 in the last 72 hours No results found for this basename: CHOL:2,HDL:2,LDLCALC:2,TRIG:2,CHOLHDL:2,LDLDIRECT:2 in the last 72 hours No results found for this basename: TSH,T4TOTAL,FREET3,T3FREE,THYROIDAB in the last 72 hours No results found for this  basename: VITAMINB12:2,FOLATE:2,FERRITIN:2,TIBC:2,IRON:2,RETICCTPCT:2 in the last 72 hours  Micro Results: Recent Results (from the past 240 hour(s))  CULTURE, BLOOD (ROUTINE X 2)     Status: Normal (Preliminary result)   Collection Time   05/28/11  5:30 PM      Component Value Range Status Comment   Specimen Description BLOOD LEFT ARM   Final    Special Requests     Final    Value: BOTTLES DRAWN AEROBIC AND ANAEROBIC 10CC AER 7CC ANA   Setup Time [id number]   Final    Culture     Final    Value:        BLOOD CULTURE RECEIVED NO GROWTH TO DATE CULTURE WILL BE HELD FOR 5 DAYS BEFORE ISSUING A FINAL NEGATIVE REPORT   Report Status PENDING   Incomplete   CULTURE, BLOOD (ROUTINE X 2)     Status: Normal (Preliminary result)   Collection Time   05/28/11  5:40 PM      Component Value Range Status Comment   Specimen Description BLOOD LEFT ARM   Final    Special Requests BOTTLES DRAWN AEROBIC ONLY 5CC   Final    Setup Time [id number]   Final    Culture     Final    Value:        BLOOD CULTURE RECEIVED NO GROWTH  TO DATE CULTURE WILL BE HELD FOR 5 DAYS BEFORE ISSUING A FINAL NEGATIVE REPORT   Report Status PENDING   Incomplete     Studies/Results: No results found.  Medications: I have reviewed the patient's current medications. Scheduled Meds:    . aspirin  325 mg Oral Daily  . cholecalciferol  1,000 Units Oral Daily  . diltiazem  300 mg Oral Daily  . doxazosin  4 mg Oral Daily  . ferrous sulfate  325 mg Oral TID WC  . Flora-Q  1 capsule Oral Daily  . fluticasone  2 spray Each Nare Daily  . heparin  5,000 Units Subcutaneous Q8H  . insulin aspart  0-9 Units Subcutaneous TID WC  . insulin glargine  35 Units Subcutaneous Q0700  . latanoprost  1 drop Both Eyes Daily  . niacin  500 mg Oral QHS  . omega-3 acid ethyl esters  1 g Oral Daily  . pantoprazole  40 mg Oral Daily  . polyvinyl alcohol  1 drop Both Eyes BID  . prasugrel  10 mg Oral Daily  . rosuvastatin  10 mg Oral q1800    . sodium chloride  3 mL Intravenous Q12H  . vancomycin  1,250 mg Intravenous Q24H  . DISCONTD: cefTRIAXone (ROCEPHIN)  IV  1 g Intravenous Q24H  . DISCONTD: furosemide  20 mg Oral Daily  . DISCONTD: insulin glargine  30 Units Subcutaneous Q0700  . DISCONTD: lisinopril  30 mg Oral BID   Continuous Infusions:  PRN Meds:.sodium chloride, acetaminophen, acetaminophen, clobetasol ointment, diphenhydrAMINE, HYDROcodone-acetaminophen, HYDROmorphone, nitroGLYCERIN, olopatadine, sodium chloride  Assessment/Plan: 1. Cellulitis and abscess of leg.  Continue vancomycin, ceftriaxone discontinued yesterday by ID.  Repeat lower extremity Dopplers on January 2 of 2013 showed no evidence for DVT or Baker's cyst.  Patient had lower extremity Dopplers done on 05/24/2011 which was negative at that time.   2. Mixed hyperlipidemia.  Continue rosuvastatin and niacin.  3.  Peripheral neuropathy.  Stable.  4.  Hypertension.  Stable.  Continue lisinopril, furosemide, and diltiazem.  5.  Acute renal failure on top of chronic kidney disease stage II/III. Baseline creatinine is around 1.4.  Creatinine has trended up to 1.78 from 1.63 today.  Discontinue furosemide and lisinopril.  Question acute interstitial nephritis from antibiotics (ceftriaxone)?  Continue to monitor.  6.  History of coronary artery disease.  Stable.  Continue aspirin.  7.  History of Barrett's esophagus.  Continue PPI.  8.  Diabetes mellitus type 2 with complications, uncontrolled.  Increase patient's dose of Lantus.  Continue sliding scale insulin.  9.  Prophylaxis.  Subcutaneous heparin.  10.  Disposition.  Pending.   LOS: 3 days  Ethan Vargas A, MD 05/31/2011, 3:42 PM

## 2011-05-31 NOTE — Progress Notes (Signed)
ANTIBIOTIC CONSULT NOTE - FOLLOW UP  Pharmacy Consult for Vancomycin Indication: Severe Cellulitis LLE, failed outpatient oral Augmentin.   Assessment: 71 yo male on D#4 vancomycin for LLE cellulitis. Vanco trough today is slightly supratherapeutic at 18.3, likely due to rising Scr (1.78-->1.63-->1.14).  Estimated CrCl ~ 37.5 ml/min.  BCx ngtd x2. Will adjust dose down to account for renal function changes.  Pharmacy System-Based Medication Review: Anticoagulation: SQ heparin for VTE prophylaxis, also on home aspirin & prasugrel Cardiovascular: Hx CAD/HTN/HLD- stable on home meds Endocrinology: Hx DMII- CBGs 97-233 (uncontrolled), on SSI & lantus dose increased, home metformin dc'd d/t rising SCr Fluids/Electrolytes/Nutrition: lytes wnl Nephrology: SCr continues to rise, meds adjusted for renal function Best Practices: heparin, PPI, home meds addressed  Goal of Therapy:  Vancomycin trough level 10-15 mcg/ml  Plan:  Change vancomycin to 1000mg  IV Q24h Follow-up renal function and adjust dose as needed.  Bayard Beaver. Saul Fordyce, PharmD  05/31/2011 7:39 PM   Allergies  Allergen Reactions  . Mercury Swelling  . Clopidogrel Bisulfate     REACTION: rash  . Contrast Media (Iodinated Diagnostic Agents) Swelling    Premedication with prednisone  . Iodine Swelling  . Latex     Oral nausea  . Sulfonamide Derivatives     unknown    Patient Measurements: Height: 5' 7.25" (170.8 cm) Weight: 160 lb (72.576 kg) (verbal from patient) IBW/kg (Calculated) : 66.68   Vital Signs: Temp: 98.6 F (37 C) (01/03 1300) Temp src: Oral (01/03 1300) BP: 143/66 mmHg (01/03 1300) Pulse Rate: 83  (01/03 1300) Intake/Output from previous day:   Intake/Output from this shift:    Labs:  Basename 05/31/11 0530 05/30/11 0710 05/29/11 0606  WBC -- 8.5 9.5  HGB -- 9.9* 10.8*  PLT -- 149* 144*  LABCREA -- -- --  CREATININE 1.78* 1.63* 1.41*   Estimated Creatinine Clearance: 37.5 ml/min (by C-G formula  based on Cr of 1.78).  Basename 05/31/11 1819  VANCOTROUGH 18.4  VANCOPEAK --  VANCORANDOM --  GENTTROUGH --  GENTPEAK --  GENTRANDOM --  TOBRATROUGH --  TOBRAPEAK --  TOBRARND --  AMIKACINPEAK --  AMIKACINTROU --  AMIKACIN --     Microbiology: Recent Results (from the past 720 hour(s))  CULTURE, BLOOD (ROUTINE X 2)     Status: Normal (Preliminary result)   Collection Time   05/28/11  5:30 PM      Component Value Range Status Comment   Specimen Description BLOOD LEFT ARM   Final    Special Requests     Final    Value: BOTTLES DRAWN AEROBIC AND ANAEROBIC 10CC AER 7CC ANA   Setup Time [id number]   Final    Culture     Final    Value:        BLOOD CULTURE RECEIVED NO GROWTH TO DATE CULTURE WILL BE HELD FOR 5 DAYS BEFORE ISSUING A FINAL NEGATIVE REPORT   Report Status PENDING   Incomplete   CULTURE, BLOOD (ROUTINE X 2)     Status: Normal (Preliminary result)   Collection Time   05/28/11  5:40 PM      Component Value Range Status Comment   Specimen Description BLOOD LEFT ARM   Final    Special Requests BOTTLES DRAWN AEROBIC ONLY 5CC   Final    Setup Time [id number]   Final    Culture     Final    Value:        BLOOD CULTURE RECEIVED NO GROWTH TO DATE  CULTURE WILL BE HELD FOR 5 DAYS BEFORE ISSUING A FINAL NEGATIVE REPORT   Report Status PENDING   Incomplete     Medical History: Past Medical History  Diagnosis Date  . Myocardial infarction   . Sleep apnea   . Diabetes mellitus   . Coronary artery disease   . Hypertension     Medications:  Prescriptions prior to admission  Medication Sig Dispense Refill  . acetaminophen (TYLENOL 8 HOUR) 650 MG CR tablet Take 1,300 mg by mouth 2 (two) times daily as needed. For pain       . aspirin 325 MG tablet Take 325 mg by mouth daily.        Marland Kitchen azelastine (OPTIVAR) 0.05 % ophthalmic solution Place 1 drop into both eyes daily as needed. To affected eyes        . cholecalciferol (VITAMIN D) 1000 UNITS tablet Take 1,000 Units by  mouth daily.        . clobetasol ointment (TEMOVATE) 0.05 % Apply 1 application topically 2 (two) times daily as needed. To affected area; do not use TAZORAC       . diltiazem (CARDIZEM CD) 300 MG 24 hr capsule Take 300 mg by mouth daily.        Marland Kitchen doxazosin (CARDURA) 4 MG tablet Take 4 mg by mouth daily.        . ferrous sulfate 325 (65 FE) MG tablet Take 325 mg by mouth 3 (three) times daily with meals.        . furosemide (LASIX) 20 MG tablet Take 20 mg by mouth daily.        Marland Kitchen HYDROcodone-acetaminophen (VICODIN) 5-500 MG per tablet Take 1 tablet by mouth every 6 (six) hours as needed. For pain       . insulin glargine (LANTUS) 100 UNIT/ML injection Inject 30 Units into the skin every morning.        . latanoprost (XALATAN) 0.005 % ophthalmic solution Place 1 drop into both eyes daily as needed. For pressure       . lisinopril (PRINIVIL,ZESTRIL) 20 MG tablet Take 30 mg by mouth 2 (two) times daily.        . metFORMIN (GLUCOPHAGE) 1000 MG tablet Take 1,000 mg by mouth 2 (two) times daily with a meal.        . niacin (NIASPAN) 500 MG CR tablet Take 500 mg by mouth daily.        . nitroGLYCERIN (NITROSTAT) 0.4 MG SL tablet Place 0.4 mg under the tongue every 5 (five) minutes as needed. For chest pain       . Omega-3 Fatty Acids (FISH OIL) 300 MG CAPS Take 300 mg by mouth daily.        . pantoprazole (PROTONIX) 40 MG tablet Take 40 mg by mouth daily.        Bertram Gala Glycol-Propyl Glycol (SYSTANE OP) Apply 1 drop to eye daily.        . prasugrel (EFFIENT) 10 MG TABS Take 10 mg by mouth daily.        . rosuvastatin (CRESTOR) 10 MG tablet Take 10 mg by mouth daily.        Marland Kitchen triamcinolone (NASACORT AQ) 55 MCG/ACT nasal inhaler Place 2 sprays into the nose as needed. For congestion        . DISCONTD: amoxicillin-clavulanate (AUGMENTIN) 875-125 MG per tablet Take 1 tablet by mouth 2 (two) times daily.

## 2011-06-01 LAB — BASIC METABOLIC PANEL
Chloride: 101 mEq/L (ref 96–112)
Creatinine, Ser: 1.61 mg/dL — ABNORMAL HIGH (ref 0.50–1.35)
GFR calc Af Amer: 49 mL/min — ABNORMAL LOW (ref >90)
Potassium: 3.8 mEq/L (ref 3.5–5.1)
Sodium: 134 mEq/L — ABNORMAL LOW (ref 135–145)

## 2011-06-01 LAB — CBC
HCT: 28.7 % — ABNORMAL LOW (ref 39.0–52.0)
RBC: 3.19 MIL/uL — ABNORMAL LOW (ref 4.22–5.81)
RDW: 13.4 % (ref 11.5–15.5)
WBC: 6.5 10*3/uL (ref 4.0–10.5)

## 2011-06-01 LAB — GLUCOSE, CAPILLARY: Glucose-Capillary: 207 mg/dL — ABNORMAL HIGH (ref 70–99)

## 2011-06-01 MED ORDER — DOXYCYCLINE HYCLATE 100 MG PO TABS
100.0000 mg | ORAL_TABLET | Freq: Two times a day (BID) | ORAL | Status: DC
  Administered 2011-06-01: 100 mg via ORAL
  Filled 2011-06-01 (×2): qty 1

## 2011-06-01 MED ORDER — HYDROCODONE-ACETAMINOPHEN 5-500 MG PO TABS: 1.0000 | ORAL_TABLET | Freq: Four times a day (QID) | ORAL | Status: DC | PRN

## 2011-06-01 MED ORDER — DOXYCYCLINE HYCLATE 100 MG PO TABS: 100.0000 mg | ORAL_TABLET | Freq: Two times a day (BID) | ORAL | Status: AC

## 2011-06-01 MED ORDER — CEPHALEXIN 500 MG PO CAPS
500.0000 mg | ORAL_CAPSULE | Freq: Four times a day (QID) | ORAL | Status: DC
  Filled 2011-06-01 (×3): qty 1

## 2011-06-01 MED ORDER — POLYVINYL ALCOHOL 1.4 % OP SOLN
1.0000 [drp] | OPHTHALMIC | Status: DC | PRN
  Filled 2011-06-01: qty 15

## 2011-06-01 MED ORDER — CEPHALEXIN 500 MG PO CAPS: 500.0000 mg | ORAL_CAPSULE | Freq: Four times a day (QID) | ORAL | Status: AC

## 2011-06-01 NOTE — Progress Notes (Signed)
Patient ID: Ethan Vargas, male   DOB: 1940/08/19, 71 y.o.   MRN: [id number]  INFECTIOUS DISEASE PROGRESS NOTE    Date of Admission:  05/28/2011   Total days of antibiotics 8        Day 5 vancomycin         Principal Problem:  *Cellulitis and abscess of leg, except foot Active Problems:  Renal insufficiency  Mixed hyperlipidemia  PERIPHERAL NEUROPATHY  HYPERTENSION  CORONARY ATHEROSCLEROSIS NATIVE CORONARY ARTERY  UNSPECIFIED SECONDARY CARDIOMYOPATHY  BARRETTS ESOPHAGUS  Lumbago  Diabetes mellitus type 2 with complications, uncontrolled  ARF (acute renal failure)  Chronic kidney disease (CKD), stage III (moderate)      . aspirin  325 mg Oral Daily  . cholecalciferol  1,000 Units Oral Daily  . diltiazem  300 mg Oral Daily  . doxazosin  4 mg Oral Daily  . ferrous sulfate  325 mg Oral TID WC  . Flora-Q  1 capsule Oral Daily  . fluticasone  2 spray Each Nare Daily  . heparin  5,000 Units Subcutaneous Q8H  . insulin aspart  0-9 Units Subcutaneous TID WC  . insulin glargine  35 Units Subcutaneous Q0700  . latanoprost  1 drop Both Eyes Daily  . niacin  500 mg Oral QHS  . omega-3 acid ethyl esters  1 g Oral Daily  . pantoprazole  40 mg Oral Daily  . prasugrel  10 mg Oral Daily  . rosuvastatin  10 mg Oral q1800  . sodium chloride  3 mL Intravenous Q12H  . vancomycin  1,000 mg Intravenous Q24H  . DISCONTD: polyvinyl alcohol  1 drop Both Eyes BID  . DISCONTD: vancomycin  1,250 mg Intravenous Q24H    Subjective: He still has pain when he gets up to go to the bathroom but he is feeling better.  Objective: Temp:  [97.6 F (36.4 C)-98.6 F (37 C)] 97.8 F (36.6 C) (01/04 0527) Pulse Rate:  [83-89] 89  (01/04 0527) Resp:  [18-20] 20  (01/04 0527) BP: (143-158)/(66-67) 158/67 mmHg (01/04 0527) SpO2:  [98 %-100 %] 100 % (01/04 0527)  General: He is in no distress visiting with his wife Skin: The erythema of his left leg and foot is resolving. His leg is less tender to  palpation.  Lab Results Lab Results  Component Value Date   WBC 6.5 06/01/2011   HGB 9.3* 06/01/2011   HCT 28.7* 06/01/2011   MCV 90.0 06/01/2011   PLT 148* 06/01/2011    Lab Results  Component Value Date   CREATININE 1.61* 06/01/2011   BUN 16 06/01/2011   NA 134* 06/01/2011   K 3.8 06/01/2011   CL 101 06/01/2011   CO2 24 06/01/2011    Lab Results  Component Value Date   ALT 26 05/30/2011   AST 22 05/30/2011   ALKPHOS 61 05/30/2011   BILITOT 0.3 05/30/2011       Microbiology: Recent Results (from the past 240 hour(s))  CULTURE, BLOOD (ROUTINE X 2)     Status: Normal (Preliminary result)   Collection Time   05/28/11  5:30 PM      Component Value Range Status Comment   Specimen Description BLOOD LEFT ARM   Final    Special Requests     Final    Value: BOTTLES DRAWN AEROBIC AND ANAEROBIC 10CC AER 7CC ANA   Setup Time [id number]   Final    Culture     Final    Value:  BLOOD CULTURE RECEIVED NO GROWTH TO DATE CULTURE WILL BE HELD FOR 5 DAYS BEFORE ISSUING A FINAL NEGATIVE REPORT   Report Status PENDING   Incomplete   CULTURE, BLOOD (ROUTINE X 2)     Status: Normal (Preliminary result)   Collection Time   05/28/11  5:40 PM      Component Value Range Status Comment   Specimen Description BLOOD LEFT ARM   Final    Special Requests BOTTLES DRAWN AEROBIC ONLY 5CC   Final    Setup Time [id number]   Final    Culture     Final    Value:        BLOOD CULTURE RECEIVED NO GROWTH TO DATE CULTURE WILL BE HELD FOR 5 DAYS BEFORE ISSUING A FINAL NEGATIVE REPORT   Report Status PENDING   Incomplete     Studies/Results: No results found.   Assessment: His cellulitis is resolving slowly. I believe that he can switch over to oral antibiotic therapy with cephalexin and doxycycline at any time and be discharged home with pain medication. I would treat for 2 more days for a total of one week of therapy targeting streptococci and staphylococci including MRSA. He has been a little reluctant to consider  discharge home but I told him that he could essentially be managed the same way at home with his leg up in bed or in his recliner.  Plan: 1. Consider discharge home soon to complete 2 more days of cephalexin 500 mg 4 times a day plus doxycycline 100 mg twice a day   Cliffton Asters, MD [facility] [phone] pager   [phone] cell 06/01/2011, 11:40 AM

## 2011-06-01 NOTE — Discharge Summary (Signed)
Physician Discharge Summary  Patient ID: Ethan Vargas MRN: [id number] DOB/AGE: [birth year] 71 y.o.  Admit date: 05/28/2011 Discharge date: 06/01/2011    Discharge Diagnoses:  1. Cellulitis of the left leg, improving. 2. Chronic kidney disease. 3. Hypertension. 4. Type 2 diabetes mellitus.   Current Discharge Medication List    START taking these medications   Details  cephALEXin (KEFLEX) 500 MG capsule Take 1 capsule (500 mg total) by mouth 4 (four) times daily. Qty: 12 capsule, Refills: 0    doxycycline (VIBRA-TABS) 100 MG tablet Take 1 tablet (100 mg total) by mouth every 12 (twelve) hours. Qty: 6 tablet, Refills: 0      CONTINUE these medications which have CHANGED   Details  HYDROcodone-acetaminophen (VICODIN) 5-500 MG per tablet Take 1 tablet by mouth every 6 (six) hours as needed for pain. For pain Qty: 30 tablet, Refills: 0      CONTINUE these medications which have NOT CHANGED   Details  aspirin 325 MG tablet Take 325 mg by mouth daily.      azelastine (OPTIVAR) 0.05 % ophthalmic solution Place 1 drop into both eyes daily as needed. To affected eyes      cholecalciferol (VITAMIN D) 1000 UNITS tablet Take 1,000 Units by mouth daily.      clobetasol ointment (TEMOVATE) 0.05 % Apply 1 application topically 2 (two) times daily as needed. To affected area; do not use TAZORAC     diltiazem (CARDIZEM CD) 300 MG 24 hr capsule Take 300 mg by mouth daily.      doxazosin (CARDURA) 4 MG tablet Take 4 mg by mouth daily.      ferrous sulfate 325 (65 FE) MG tablet Take 325 mg by mouth 3 (three) times daily with meals.      insulin glargine (LANTUS) 100 UNIT/ML injection Inject 30 Units into the skin every morning.      latanoprost (XALATAN) 0.005 % ophthalmic solution Place 1 drop into both eyes daily as needed. For pressure     niacin (NIASPAN) 500 MG CR tablet Take 500 mg by mouth daily.      nitroGLYCERIN (NITROSTAT) 0.4 MG SL tablet Place 0.4 mg under the tongue  every 5 (five) minutes as needed. For chest pain     Omega-3 Fatty Acids (FISH OIL) 300 MG CAPS Take 300 mg by mouth daily.      pantoprazole (PROTONIX) 40 MG tablet Take 40 mg by mouth daily.      Polyethyl Glycol-Propyl Glycol (SYSTANE OP) Apply 1 drop to eye daily.      prasugrel (EFFIENT) 10 MG TABS Take 10 mg by mouth daily.      rosuvastatin (CRESTOR) 10 MG tablet Take 10 mg by mouth daily.      triamcinolone (NASACORT AQ) 55 MCG/ACT nasal inhaler Place 2 sprays into the nose as needed. For congestion        STOP taking these medications     acetaminophen (TYLENOL 8 HOUR) 650 MG CR tablet      amoxicillin-clavulanate (AUGMENTIN) 875-125 MG per tablet      furosemide (LASIX) 20 MG tablet      lisinopril (PRINIVIL,ZESTRIL) 20 MG tablet      metFORMIN (GLUCOPHAGE) 1000 MG tablet         Discharged Condition: Stable and improved.    Consults: Infectious disease, Dr. Cliffton Asters.  Significant Diagnostic Studies: US Venous Img Lower Unilateral Left  05/24/2011  *RADIOLOGY REPORT*  Clinical Data: Left leg pain/swelling  LEFT LOWER EXTREMITY  VENOUS DUPLEX ULTRASOUND  Technique:  Gray-scale sonography with graded compression, as well as color Doppler and duplex ultrasound, were performed to evaluate the deep venous system of the lower extremity from the level of the common femoral vein through the popliteal and proximal calf veins. Spectral Doppler was utilized to evaluate flow at rest and with distal augmentation maneuvers.  Comparison:  None.  Findings: Visualized left lower extremity deep venous system appears patent.  Normal compressibility.  Patent color Doppler flow.  Satisfactory spectral Doppler with respiratory variation and response to augmentation.  Subcutaneous edema in the calf.  IMPRESSION: No deep venous thrombosis in the visualized left lower extremity.  Subcutaneous edema in the calf.  Original Report Authenticated By: Charline Bills, M.D.    Lab  Results: Basic Metabolic Panel:  Basename 06/01/11 0645 05/31/11 0530  NA 134* 138  K 3.8 4.1  CL 101 103  CO2 24 17*  GLUCOSE 213* 85  BUN 16 20  CREATININE 1.61* 1.78*  CALCIUM 8.6 8.8  MG -- --  PHOS -- --   Liver Function Tests:  Basename 05/30/11 0710  AST 22  ALT 26  ALKPHOS 61  BILITOT 0.3  PROT 6.4  ALBUMIN 2.3*     CBC:  Basename 06/01/11 0645 05/30/11 0710  WBC 6.5 8.5  NEUTROABS -- --  HGB 9.3* 9.9*  HCT 28.7* 30.7*  MCV 90.0 91.1  PLT 148* 149*    Recent Results (from the past 240 hour(s))  CULTURE, BLOOD (ROUTINE X 2)     Status: Normal (Preliminary result)   Collection Time   05/28/11  5:30 PM      Component Value Range Status Comment   Specimen Description BLOOD LEFT ARM   Final    Special Requests     Final    Value: BOTTLES DRAWN AEROBIC AND ANAEROBIC 10CC AER 7CC ANA   Setup Time [id number]   Final    Culture     Final    Value:        BLOOD CULTURE RECEIVED NO GROWTH TO DATE CULTURE WILL BE HELD FOR 5 DAYS BEFORE ISSUING A FINAL NEGATIVE REPORT   Report Status PENDING   Incomplete   CULTURE, BLOOD (ROUTINE X 2)     Status: Normal (Preliminary result)   Collection Time   05/28/11  5:40 PM      Component Value Range Status Comment   Specimen Description BLOOD LEFT ARM   Final    Special Requests BOTTLES DRAWN AEROBIC ONLY 5CC   Final    Setup Time [id number]   Final    Culture     Final    Value:        BLOOD CULTURE RECEIVED NO GROWTH TO DATE CULTURE WILL BE HELD FOR 5 DAYS BEFORE ISSUING A FINAL NEGATIVE REPORT   Report Status PENDING   Incomplete      Hospital Course: This very pleasant 71 year old man was admitted with left lower leg erythema and pain and swelling for approximately one week. Please see initial history and physical examination. He was initially started on oral Augmentin as an outpatient and hydrocodone for pain control but he failed outpatient treatment and the erythema and cellulitis progressively got worse and  therefore was admitted to the hospital. Whilst in the hospital he received intravenous vancomycin. His cellulitis has improved since hospitalization. Unfortunately, his creatinine has bumped up little bit and ACE inhibitor and diuretic has been discontinued. This was being used for blood pressure control. Fortunately,  despite discontinuation of the ACE inhibitor, his blood pressure is reasonably stable. This will need to be closely followed. He has been reviewed by Dr. Cliffton Asters, infectious diseases, today, and he feels that the patient is stable now for discharge on oral antibiotics for a further 3 days. He will receive analgesics as required in the meantime. He does not have a fever.  Discharge Exam: Blood pressure 138/57, pulse 73, temperature 98.7 F (37.1 C), temperature source Oral, resp. rate 20, height 5' 7.25" (1.708 m), weight 72.576 kg (160 lb), SpO2 99.00%. He looks systemically well. He is alert and orientated. Is not toxic or septic clinically. Heart sounds are present and normal. Lung fields are clear. His left leg does have cellulitis but this is clearly improving from the demarcated  black line that was previously used to document the extent of the cellulitis which was essentially from his knee down to his ankle.  Disposition: Home. He'll finish up for a three-day course of antibiotics. His been encouraged to drink plenty of water and keep a check on his blood pressure. He is also been advised to follow up with his nephrologist Dr. Caryn Section as was his primary care physician.  Discharge Orders    Future Orders Please Complete By Expires   Diet - low sodium heart healthy      Increase activity slowly      Discharge instructions      Comments:   Please check your blood pressure daily. Seek medical attention if it is elevated as we discussed. Drink plenty of water every day.        SignedWilson Singer Pager [phone]  06/01/2011, 2:09 PM

## 2011-06-01 NOTE — Progress Notes (Signed)
D/C instructions reviewed with patient and wife. RX x 3 given. All questions answered. No hh services or equipment needed. Pt d/c'ed via wheelchair in stable condition

## 2011-06-04 ENCOUNTER — Other Ambulatory Visit: Payer: Self-pay | Admitting: Family Medicine

## 2011-06-04 ENCOUNTER — Ambulatory Visit
Admission: RE | Admit: 2011-06-04 | Discharge: 2011-06-04 | Disposition: A | Discharge status: Home / Self Care | Payer: BC Managed Care – PPO | Source: Ambulatory Visit | Attending: Family Medicine | Admitting: Family Medicine

## 2011-06-04 DIAGNOSIS — M25473 Effusion, unspecified ankle: Secondary | ICD-10-CM

## 2011-06-04 LAB — CULTURE, BLOOD (ROUTINE X 2)
Culture  Setup Time: 201301010210
Culture: NO GROWTH

## 2011-06-11 ENCOUNTER — Telehealth: Payer: Self-pay | Admitting: *Deleted

## 2011-06-11 NOTE — Telephone Encounter (Signed)
States his cellulitis is doing better. Less red, swelling. Skin is peeling. He main concern is a very painful ankle. He is using a walker since it is so hard to weight bear. paini varies from a 2-7 on a 1/10 scale. Wants to know what to do about this pain. His pcpc is Dr. Clelia Croft. I suggested he call her. States she told him to just "get up & walk" I suggested he call back & find out more details of why he is hurting & what he can do for it so he can walk. If his cellulitis is such that he needs to be seen here, his md will send him here but this may not be from that. He will call her & try to get the answers he wants

## 2011-06-19 ENCOUNTER — Ambulatory Visit
Admission: RE | Admit: 2011-06-19 | Discharge: 2011-06-19 | Disposition: A | Discharge status: Home / Self Care | Payer: BC Managed Care – PPO | Source: Ambulatory Visit | Attending: Family Medicine | Admitting: Family Medicine

## 2011-06-19 ENCOUNTER — Other Ambulatory Visit: Payer: Self-pay | Admitting: Family Medicine

## 2011-06-19 DIAGNOSIS — M79609 Pain in unspecified limb: Secondary | ICD-10-CM

## 2012-04-11 ENCOUNTER — Other Ambulatory Visit: Payer: Self-pay | Admitting: Family Medicine

## 2012-04-11 ENCOUNTER — Ambulatory Visit
Admission: RE | Admit: 2012-04-11 | Discharge: 2012-04-11 | Disposition: A | Discharge status: Home / Self Care | Payer: BC Managed Care – PPO | Source: Ambulatory Visit | Attending: Family Medicine | Admitting: Family Medicine

## 2012-04-11 DIAGNOSIS — R0989 Other specified symptoms and signs involving the circulatory and respiratory systems: Secondary | ICD-10-CM

## 2012-04-11 DIAGNOSIS — R05 Cough: Secondary | ICD-10-CM

## 2012-06-26 ENCOUNTER — Other Ambulatory Visit: Payer: Self-pay | Admitting: *Deleted

## 2012-06-26 DIAGNOSIS — I739 Peripheral vascular disease, unspecified: Secondary | ICD-10-CM

## 2012-06-26 DIAGNOSIS — M7989 Other specified soft tissue disorders: Secondary | ICD-10-CM

## 2012-08-18 ENCOUNTER — Encounter: Payer: Self-pay | Admitting: Vascular Surgery

## 2012-08-19 ENCOUNTER — Encounter (INDEPENDENT_AMBULATORY_CARE_PROVIDER_SITE_OTHER): Payer: BC Managed Care – PPO | Admitting: Vascular Surgery

## 2012-08-19 ENCOUNTER — Ambulatory Visit (INDEPENDENT_AMBULATORY_CARE_PROVIDER_SITE_OTHER): Payer: BC Managed Care – PPO | Admitting: Vascular Surgery

## 2012-08-19 ENCOUNTER — Encounter: Payer: Self-pay | Admitting: Vascular Surgery

## 2012-08-19 VITALS — BP 138/72 | HR 59 | Resp 16 | Ht 67.0 in | Wt 145.0 lb

## 2012-08-19 DIAGNOSIS — I739 Peripheral vascular disease, unspecified: Secondary | ICD-10-CM

## 2012-08-19 DIAGNOSIS — M7989 Other specified soft tissue disorders: Secondary | ICD-10-CM

## 2012-08-19 NOTE — Progress Notes (Signed)
[facility] Specialist of [facility]   Patient name: Ethan Vargas MRN: [id number] DOB: Jun 30, 1940 Sex: male   Referred by: Dr Katrinka Blazing  Reason for referral:  Chief Complaint  Patient presents with  . New Evaluation    Left leg swelling/weakness/pvd ref. by Dr. Verdis Prime.  C/O  Cellulitis Dec/Jan  2012 Left leg has decreased in size as of Jan. 2013  Pt. has burning and redness of the left leg.    HISTORY OF PRESENT ILLNESS: The patient has today for evaluation of lower surety arterial insufficiency and chronic swelling in his left leg. Does have a history of prior coronary bypass grafting in 1998 with vein harvest from the saphenous vein from his knee to his ankle on the left. He reports that he had an episode of cellulitis and has had persistent swelling since that time which is been over one year ago. He did attempt compression garments and was unable to wear this at that time. He does not have any tissue loss history and does not have any history of swelling in his right leg. He is concerned that his sister had a limb loss he to severe peripheral vascular occlusive disease and is concerned about his risk of this.  Past Medical History  Diagnosis Date  . Myocardial infarction   . Sleep apnea   . Diabetes mellitus   . Coronary artery disease   . Hypertension   . Anemia   . CHF (congestive heart failure)   . DVT (deep venous thrombosis)   . Peripheral vascular disease   . Cellulitis Dec./Jan 2011 and 2012    Past Surgical History  Procedure Laterality Date  . Coronary artery bypass graft  Sept. 1998  . Arterial thrombectomy  2003    X's 2 stents  . Stents surgery  2007  and  2010    [facility]  . Knee surgery  1961    Right knee    History   Social History  . Marital Status: Married    Spouse Name: N/A    Number of Children: N/A  . Years of Education: N/A   Occupational History  . Not on file.   Social History Main Topics  . Smoking status: Never  Smoker   . Smokeless tobacco: Never Used  . Alcohol Use: No  . Drug Use: No  . Sexually Active: Not on file   Other Topics Concern  . Not on file   Social History Narrative  . No narrative on file    Family History  Problem Relation Age of Onset  . Hypertension Mother   . Deep vein thrombosis Mother   . Heart disease Father     Heart Disease before age 69  . Deep vein thrombosis Sister     Amputation  . Diabetes Brother   . Heart disease Brother     Anerysm stomach  . Hypertension Brother   . Deep vein thrombosis Brother     Amputation  . Heart disease Brother     Heart disease before age 69  . Hypertension Brother     Allergies as of 08/19/2012 - Review Complete 08/19/2012  Allergen Reaction Noted  . Mercury Swelling   . Clopidogrel bisulfate    . Contrast media (iodinated diagnostic agents) Swelling 05/28/2011  . Iodine Swelling   . Latex    . Sulfonamide derivatives      Current Outpatient Prescriptions on File Prior to Visit  Medication Sig Dispense Refill  .  azelastine (OPTIVAR) 0.05 % ophthalmic solution Place 1 drop into both eyes daily as needed. To affected eyes        . cholecalciferol (VITAMIN D) 1000 UNITS tablet Take 1,000 Units by mouth daily.        . clobetasol ointment (TEMOVATE) 0.05 % Apply 1 application topically 2 (two) times daily as needed. To affected area; do not use TAZORAC       . diltiazem (CARDIZEM CD) 300 MG 24 hr capsule Take 300 mg by mouth daily.        Marland Kitchen doxazosin (CARDURA) 4 MG tablet Take 4 mg by mouth daily.        . ferrous sulfate 325 (65 FE) MG tablet Take 325 mg by mouth 3 (three) times daily with meals.        . insulin glargine (LANTUS) 100 UNIT/ML injection Inject 30 Units into the skin every morning.        . latanoprost (XALATAN) 0.005 % ophthalmic solution Place 1 drop into both eyes daily as needed. For pressure       . niacin (NIASPAN) 500 MG CR tablet Take 500 mg by mouth daily.        . nitroGLYCERIN (NITROSTAT)  0.4 MG SL tablet Place 0.4 mg under the tongue every 5 (five) minutes as needed. For chest pain       . Polyethyl Glycol-Propyl Glycol (SYSTANE OP) Apply 1 drop to eye daily.        . rosuvastatin (CRESTOR) 10 MG tablet Take 10 mg by mouth daily.        Marland Kitchen triamcinolone (NASACORT AQ) 55 MCG/ACT nasal inhaler Place 2 sprays into the nose as needed. For congestion        . aspirin 325 MG tablet Take 325 mg by mouth daily.        Marland Kitchen HYDROcodone-acetaminophen (VICODIN) 5-500 MG per tablet Take 1 tablet by mouth every 6 (six) hours as needed for pain. For pain  30 tablet  0  . Omega-3 Fatty Acids (FISH OIL) 300 MG CAPS Take 300 mg by mouth daily.        . pantoprazole (PROTONIX) 40 MG tablet Take 40 mg by mouth daily.        . prasugrel (EFFIENT) 10 MG TABS Take 10 mg by mouth daily.         No current facility-administered medications on file prior to visit.     REVIEW OF SYSTEMS:  Positives indicated with an "X"  CARDIOVASCULAR:  [ ]  chest pain   [ ]  chest pressure   [ ]  palpitations   [ ]  orthopnea   [x ] dyspnea on exertion   [x ] claudication   [x ] rest pain   [ ]  DVT   [ ]  phlebitis PULMONARY:   [ ]  productive cough   [ ]  asthma   [x ] wheezing NEUROLOGIC:   [ ]  weakness  [ ]  paresthesias  [ ]  aphasia  [ ]  amaurosis  [ ]  dizziness HEMATOLOGIC:   [x ] bleeding problems   [ ]  clotting disorders MUSCULOSKELETAL:  [ ]  joint pain   [ ]  joint swelling GASTROINTESTINAL: [ ]   blood in stool  [ ]   hematemesis GENITOURINARY:  [ ]   dysuria  [ ]   hematuria PSYCHIATRIC:  [ ]  history of major depression INTEGUMENTARY:  [ ]  rashes  [ ]  ulcers CONSTITUTIONAL:  [ ]  fever   [ ]  chills  PHYSICAL EXAMINATION:  General: The patient  is a well-nourished male, in no acute distress. Vital signs are BP 138/72  Pulse 59  Resp 16  Ht 5\' 7"  (1.702 m)  Wt 145 lb (65.772 kg)  BMI 22.71 kg/m2  SpO2 100% Pulmonary: There is a good air exchange bilaterally without wheezing or rales. Abdomen: Soft and  non-tender with normal pitch bowel sounds. Musculoskeletal: There are no major deformities.   Neurologic: No focal weakness or paresthesias are detected, Skin: There are no ulcer or rashes noted. Does have pitting edema in the left leg from his knee down to his ankle. There is no swelling out onto his foot. Psychiatric: The patient has normal affect. Cardiovascular: There is a regular rate and rhythm without significant murmur appreciated. Carotid arteries without bruits bilaterally Pulse status: 2+ radial 2+ femoral and 2+ popliteal pulses bilaterally. I do not palpate pedal pulses the left. He does have palpable dorsalis pedis pulse on the right  V[facility] Vascular Lab Studies:  Ordered and Independently Reviewed left leg venous study shows no evidence of DVT and competence of his deep and superficial system. A saphenous vein is surgically absent from the knee distally. A small saphenous vein is competent as well. Left leg arterial study reveals noncompressible vessels. He does have triphasic waveforms at the anterior tibial bilaterally.  Impression and Plan:  Long discussion with the patient explaining that he has a very minimal risk for limb loss do to his current level of arterial insufficiency and suspect this would stay stable over time. Ex-preemie importance of good foot care and notify us or his medical doctor should he develop tissue loss. Also explained the concern regarding the swelling in his left leg. I suspect this is not related to general fluid overload since it is his left leg only. I explained that he does not have any venous evidence for venous hypertension. This does not appear to be typical of lymphedema since it does not extend down to his foot is well. I explained that should not be any limb threatening issue. I explained the importance of elevation and compression if he is able to tolerate this. He was reassured this discussion will see [place] on an as-needed basis    Ethan Vargas,  Ethan Vargas [facility] [place] Office: [phone]

## 2012-09-16 ENCOUNTER — Other Ambulatory Visit: Payer: Self-pay | Admitting: Family Medicine

## 2012-09-16 DIAGNOSIS — R634 Abnormal weight loss: Secondary | ICD-10-CM

## 2012-09-19 ENCOUNTER — Other Ambulatory Visit: Payer: BC Managed Care – PPO

## 2013-03-06 ENCOUNTER — Other Ambulatory Visit: Payer: Self-pay | Admitting: Interventional Cardiology

## 2013-04-07 ENCOUNTER — Other Ambulatory Visit: Payer: Self-pay

## 2013-04-09 ENCOUNTER — Telehealth: Payer: Self-pay

## 2013-04-09 MED ORDER — NITROGLYCERIN 0.4 MG SL SUBL: 0.4000 mg | SUBLINGUAL_TABLET | SUBLINGUAL | Status: DC | PRN

## 2013-04-09 NOTE — Telephone Encounter (Signed)
Refilled

## 2013-04-20 ENCOUNTER — Other Ambulatory Visit: Payer: Self-pay | Admitting: Interventional Cardiology

## 2013-04-21 NOTE — Telephone Encounter (Signed)
Message copied by Carmela Hurt on Tue Apr 21, 2013  5:31 PM ------      Message from: Verdis Prime      Created: Tue Apr 21, 2013  7:51 AM      Regarding: RE: testosterone cypiona       This is not prescribed by cardiology.      ----- Message -----         From: Carmela Hurt, RN         Sent: 04/20/2013   3:27 PM           To: Lesleigh Noe, MD, Carmela Hurt, RN      Subject: testosterone cypiona                                     Is it ok to refill this medication for Mr. Poage, last script 07/17/2012.      testosterone cypiona injectable      [facility]      Thanks, Addison Lank, RN Patient Care Advocate       ------

## 2013-07-06 ENCOUNTER — Ambulatory Visit (INDEPENDENT_AMBULATORY_CARE_PROVIDER_SITE_OTHER): Payer: Medicare HMO | Admitting: Interventional Cardiology

## 2013-07-06 ENCOUNTER — Encounter: Payer: Self-pay | Admitting: Interventional Cardiology

## 2013-07-06 VITALS — BP 122/60 | HR 69 | Ht 67.0 in | Wt 151.0 lb

## 2013-07-06 DIAGNOSIS — N529 Male erectile dysfunction, unspecified: Secondary | ICD-10-CM | POA: Insufficient documentation

## 2013-07-06 DIAGNOSIS — N183 Chronic kidney disease, stage 3 unspecified: Secondary | ICD-10-CM

## 2013-07-06 DIAGNOSIS — I5042 Chronic combined systolic (congestive) and diastolic (congestive) heart failure: Secondary | ICD-10-CM

## 2013-07-06 DIAGNOSIS — I2510 Atherosclerotic heart disease of native coronary artery without angina pectoris: Secondary | ICD-10-CM

## 2013-07-06 DIAGNOSIS — I10 Essential (primary) hypertension: Secondary | ICD-10-CM

## 2013-07-06 MED ORDER — VARDENAFIL HCL 20 MG PO TABS: 20.0000 mg | ORAL_TABLET | Freq: Every day | ORAL | Status: DC | PRN

## 2013-07-06 NOTE — Progress Notes (Signed)
Patient ID: Ethan Vargas, male   DOB: January 14, 1941, 73 y.o.   MRN: [id number] Past Medical History  CAD with ischemic cardiomyopathy, prior inferior infarction, 2004. Occluded SVG to RCA and DES SVG to Diag. 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 [place], [place]  [address] Phone: [phone] Fax:  [phone]  Date:  07/06/2013   ID:  Ethan Vargas, DOB January 14, 1941, MRN [id number]  PCP:  Lupita RaiderSHAW,KIMBERLEE, MD   ASSESSMENT:  1. Coronary atherosclerosis, stable without angina 2. Chronic kidney disease, acute on chronic, 3. Chronic systolic heart failure, with LVEF 40% by cath 2011 4. Erectile dysfunction  PLAN:  1. basic metabolic panel 2. Stress myocardial perfusion study to reassess LV function and myocardial perfusion 3. Refill Levitra 20 mg [facility] 4.Clinical followup in 6 months   SUBJECTIVE: Ethan Vargas is a 73 y.o. male who is doing relatively well. Lower the swelling has resolved. He denies dyspnea. He denies orthopnea, and exertional angina. He has had no palpitations or syncope. The appetite is been stable. He has not needed nitroglycerin on any regular basis. He states that in the last 6 months he is use nitroglycerin less than 5 times.   Wt Readings from Last 3 Encounters:  07/06/13 151 lb (68.493 kg)  08/19/12 145 lb (65.772 kg)  05/28/11 160 lb (72.576 kg)     Past Medical History  Diagnosis Date  . Myocardial infarction   . Sleep apnea   . Diabetes mellitus   . Coronary artery disease   . Hypertension   . Anemia   . CHF (congestive heart failure)   . DVT (deep venous thrombosis)   . Peripheral vascular disease   . Cellulitis  Dec./Jan 2011 and 2012    Current Outpatient Prescriptions  Medication Sig Dispense Refill  . acetaminophen (TYLENOL) 650 MG suppository Place 650 mg rectally 2 (two) times daily.      Marland Kitchen. aspirin 81 MG tablet Take 81 mg by mouth daily.      Marland Kitchen. azelastine (OPTIVAR) 0.05 % ophthalmic solution Place 1 drop into both eyes daily as needed. To affected eyes        . B-D INS SYR ULTRAFINE .3CC/30G 30G X 1/2" 0.3 ML MISC       . cholecalciferol (VITAMIN D) 1000 UNITS tablet Take 1,000 Units by mouth daily.        . clobetasol ointment (TEMOVATE) 0.05 % Apply 1 application topically 2 (two) times daily as needed. To affected area; do not use TAZORAC       . CORDRAN 4 MCG/SQCM TAPE as needed. Apply to hands      . diltiazem (CARDIZEM CD) 300 MG 24 hr capsule Take 300 mg by mouth daily.        Marland Kitchen. doxazosin (CARDURA) 4 MG tablet Take 4 mg by mouth daily.        . ferrous sulfate 325 (65 FE) MG tablet Take 325 mg by mouth 3 (three) times daily with meals.        . furosemide (LASIX) 80 MG tablet Take 0.5 tablets (40 mg total) by mouth  daily.  30 tablet  3  . insulin glargine (LANTUS) 100 UNIT/ML injection Inject 30 Units into the skin every morning.        . latanoprost (XALATAN) 0.005 % ophthalmic solution Place 1 drop into both eyes daily as needed. For pressure       . lisinopril (PRINIVIL,ZESTRIL) 20 MG tablet daily.      . montelukast (SINGULAIR) 10 MG tablet Take 25 mg by mouth daily.       Marland Kitchen NASONEX 50 MCG/ACT nasal spray as needed.      . nitroGLYCERIN (NITROSTAT) 0.4 MG SL tablet Place 1 tablet (0.4 mg total) under the tongue every 5 (five) minutes as needed. For chest pain  25 tablet  5  . Polyethyl Glycol-Propyl Glycol (SYSTANE OP) Apply 1 drop to eye as needed.       . rosuvastatin (CRESTOR) 10 MG tablet Take 10 mg by mouth daily.        Marland Kitchen spironolactone (ALDACTONE) 25 MG tablet TAKE 1/2 TABLET DAILY.  15 tablet  5  . testosterone cypionate (DEPOTESTOTERONE CYPIONATE) 200 MG/ML injection every  21 ( twenty-one) days.      Marland Kitchen triamcinolone (NASACORT AQ) 55 MCG/ACT nasal inhaler Place 2 sprays into the nose as needed. For congestion         No current facility-administered medications for this visit.    Allergies:    Allergies  Allergen Reactions  . Mercury Swelling  . Clopidogrel Bisulfate     REACTION: rash  . Contrast Media [Iodinated Diagnostic Agents] Swelling    Premedication with prednisone  . Iodine Swelling  . Latex     Oral nausea  . Sulfonamide Derivatives     unknown    Social History:  The patient  reports that he has never smoked. He has never used smokeless tobacco. He reports that he does not drink alcohol or use illicit drugs.   ROS:  Please see the history of present illness.   Denies orthostatic dizziness. Has had a recent upper respiratory infection and is coming off a course of antibiotics. All other systems reviewed and negative.   OBJECTIVE: VS:  BP 122/60  Pulse 69  Ht 5\' 7"  (1.702 m)  Wt 151 lb (68.493 kg)  BMI 23.64 kg/m2 Well nourished, well developed, in no acute distress, younger than stated age in appearance HEENT: normal Neck: JVD 2 cm above the clavicle at 45 recumbency. Carotid bruit absent  Cardiac:  normal S1, S2; RRR; no murmur. S4 gallop is audible Lungs:  clear to auscultation bilaterally, no wheezing, rhonchi or rales Abd: soft, nontender, no hepatomegaly Ext: Edema 1-2+ left lower extremity edema right is unremarkable. Pulses 2+ bilateral Skin: warm and dry Neuro:  CNs 2-12 intact, no focal abnormalities noted  EKG:  Normal sinus rhythm with interventricular conduction delay,, voltage consistent with LVH       Signed, Darci Needle III, MD 07/06/2013 4:26 PM

## 2013-07-06 NOTE — Patient Instructions (Signed)
Your physician recommends that you continue on your current medications as directed. Please refer to the Current Medication list given to you today.  An Rx for Levitra 20mg  has been sent to your pharmacy  Your physician has requested that you have en exercise stress myoview. For further information please visit [email or web address]. Please follow instruction sheet, as given.  Your physician wants you to follow-up in: 6 months You will receive a reminder letter in the mail two months in advance. If you don't receive a letter, please call our office to schedule the follow-up appointment.

## 2013-07-21 ENCOUNTER — Encounter (HOSPITAL_COMMUNITY): Payer: Medicare HMO

## 2013-07-23 ENCOUNTER — Encounter (HOSPITAL_COMMUNITY): Payer: Medicare HMO

## 2013-08-04 ENCOUNTER — Ambulatory Visit (HOSPITAL_COMMUNITY): Payer: Medicare HMO | Attending: Cardiology | Admitting: Radiology

## 2013-08-04 ENCOUNTER — Encounter: Payer: Self-pay | Admitting: Cardiovascular Disease

## 2013-08-04 VITALS — BP 161/70 | Ht 67.0 in | Wt 150.0 lb

## 2013-08-04 DIAGNOSIS — R9431 Abnormal electrocardiogram [ECG] [EKG]: Secondary | ICD-10-CM

## 2013-08-04 DIAGNOSIS — E785 Hyperlipidemia, unspecified: Secondary | ICD-10-CM | POA: Insufficient documentation

## 2013-08-04 DIAGNOSIS — Z951 Presence of aortocoronary bypass graft: Secondary | ICD-10-CM | POA: Insufficient documentation

## 2013-08-04 DIAGNOSIS — I739 Peripheral vascular disease, unspecified: Secondary | ICD-10-CM | POA: Insufficient documentation

## 2013-08-04 DIAGNOSIS — R0609 Other forms of dyspnea: Secondary | ICD-10-CM | POA: Insufficient documentation

## 2013-08-04 DIAGNOSIS — I2510 Atherosclerotic heart disease of native coronary artery without angina pectoris: Secondary | ICD-10-CM | POA: Insufficient documentation

## 2013-08-04 DIAGNOSIS — I10 Essential (primary) hypertension: Secondary | ICD-10-CM | POA: Insufficient documentation

## 2013-08-04 DIAGNOSIS — E109 Type 1 diabetes mellitus without complications: Secondary | ICD-10-CM | POA: Insufficient documentation

## 2013-08-04 DIAGNOSIS — R0989 Other specified symptoms and signs involving the circulatory and respiratory systems: Principal | ICD-10-CM | POA: Insufficient documentation

## 2013-08-04 DIAGNOSIS — I252 Old myocardial infarction: Secondary | ICD-10-CM | POA: Insufficient documentation

## 2013-08-04 DIAGNOSIS — R002 Palpitations: Secondary | ICD-10-CM | POA: Insufficient documentation

## 2013-08-04 DIAGNOSIS — Z794 Long term (current) use of insulin: Secondary | ICD-10-CM | POA: Insufficient documentation

## 2013-08-04 DIAGNOSIS — Z8249 Family history of ischemic heart disease and other diseases of the circulatory system: Secondary | ICD-10-CM | POA: Insufficient documentation

## 2013-08-04 MED ORDER — TECHNETIUM TC 99M SESTAMIBI GENERIC - CARDIOLITE
30.0000 | Freq: Once | INTRAVENOUS | Status: AC | PRN
  Administered 2013-08-04: 30 via INTRAVENOUS

## 2013-08-04 MED ORDER — TECHNETIUM TC 99M SESTAMIBI GENERIC - CARDIOLITE
10.0000 | Freq: Once | INTRAVENOUS | Status: AC | PRN
  Administered 2013-08-04: 10 via INTRAVENOUS

## 2013-08-04 NOTE — Progress Notes (Signed)
[facility] SITE 3 NUCLEAR MED [address] [place], [place] [address] [phone]    [facility]  Ethan Vargas is a 73 y.o. male     MRN : [id number]     DOB: [birth year]  Procedure Date: 08/04/2013  Nuclear Med Background Indication for Stress Test:  Evaluation for Ischemia History:  CAD, MI-Cath-2001,CABG-Stent, 2010 MPI: NL  Cardiac Risk Factors: Family History - CAD, Hypertension, IDDM Type 1, Lipids and PVD  Symptoms:  DOE and Palpitations   Nuclear Pre-Procedure Caffeine/Decaff Intake:  None NPO After: 12:00am   Lungs:  clear O2 Sat: 98% on room air. IV 0.9% NS with Angio Cath:  22g  IV Site: R Antecubital  IV Started by:  Bonnita LevanJackie Smith, RN  Chest Size (in):  42 Cup Size: n/a  Height: 5\' 7"  (1.702 m)  Weight:  150 lb (68.04 kg)  BMI:  Body mass index is 23.49 kg/(m^2). Tech Comments:  N/A    Nuclear Med Study 1 or 2 day study: 1 day  Stress Test Type:  Stress  Reading MD: N/A  Order Authorizing Provider:  Verdis PrimeHenry Smith, MD  Resting Radionuclide: Technetium 3251m Sestamibi  Resting Radionuclide Dose: 11.0 mCi   Stress Radionuclide:  Technetium 7051m Sestamibi  Stress Radionuclide Dose: 33.0 mCi           Stress Protocol Rest HR: 63 Stress HR: 141  Rest BP: 161/70 Stress BP: 172/62  Exercise Time (min): 7:00 METS: 8.50   Predicted Max HR: 150 bpm % Max HR: 94 bpm Rate Pressure Product: [id number]   Dose of Adenosine (mg):  n/a Dose of Lexiscan: n/a mg  Dose of Atropine (mg): n/a Dose of Dobutamine: n/a mcg/kg/min (at max HR)  Stress Test Technologist: Milana NaSabrina Williams, EMT-P  Nuclear Technologist:  Domenic PoliteStephen Carbone, CNMT     Rest Procedure:  Myocardial perfusion imaging was performed at rest 45 minutes following the intravenous administration of Technetium 4551m Sestamibi. Rest ECG: NSR with non-specific ST-T wave changes  Stress Procedure:  The patient exercised on the treadmill utilizing the Bruce Protocol for 7:00 minutes. The patient  stopped due to fatigue and denied any chest pain.  Technetium 3251m Sestamibi was injected at peak exercise and myocardial perfusion imaging was performed after a brief delay. Stress ECG: Downsloping ST segments increase in severity with stress.  QPS Raw Data Images:  Normal; no motion artifact; normal heart/lung ratio. Stress Images:  There is decreased uptake in the anterior apical segments. Rest Images:  There is decreased uptake in the anterior apical segments. Subtraction (SDS):  There is a fixed defect that is most consistent with a previous infarction. Transient Ischemic Dilatation (Normal <1.22):  1.12 Lung/Heart Ratio (Normal <0.45):  0.40  Quantitative Gated Spect Images QGS EDV:  238 ml QGS ESV:  171 ml  Impression Exercise Capacity:  Fair exercise capacity. BP Response:  Normal blood pressure response. Clinical Symptoms:  No chest pain. ECG Impression:  Significant ST abnormalities consistent with ischemia. Comparison with Prior Nuclear Study: No images to compare  Overall Impression:  Intermediate risk stress nuclear study.  There is a medium size defect of moderate severity affecting the apical anterior, mid anterior and mid anteroseptal segments.  There is global hypokinesis.  LV Ejection Fraction: 28%.  LV Wall Motion:  Global hypokinesis.   Ethan Vargas

## 2013-08-06 ENCOUNTER — Telehealth: Payer: Self-pay

## 2013-08-06 NOTE — Telephone Encounter (Signed)
Message copied by Jarvis NewcomerPARRIS-GODLEY, Lovett Coffin S on Thu Aug 06, 2013  2:59 PM ------      Message from: Verdis PrimeSMITH, HENRY      Created: Thu Aug 06, 2013 12:14 PM       Further reduction in LV function. I want him to be seen by EP for consideration of AICD. Would he be willing to consider? ------

## 2013-08-06 NOTE — Telephone Encounter (Signed)
pt given myoview results.Further reduction in LV function. Dr.Smith wants  him to be seen by EP for consideration of AICD.pt adv that ascheduler will call him to sch.pt agreeable with plan and verbalized understanding.

## 2013-08-06 NOTE — Telephone Encounter (Signed)
called to give pt results of myoview and Dr.Smith recommendations.lmom for pt to call back

## 2013-08-06 NOTE — Telephone Encounter (Signed)
Message copied by Jarvis NewcomerPARRIS-GODLEY, LISA S on Thu Aug 06, 2013  2:37 PM ------      Message from: Verdis PrimeSMITH, HENRY      Created: Thu Aug 06, 2013 12:14 PM       Further reduction in LV function. I want him to be seen by EP for consideration of AICD. Would he be willing to consider? ------

## 2013-08-10 ENCOUNTER — Ambulatory Visit (INDEPENDENT_AMBULATORY_CARE_PROVIDER_SITE_OTHER): Payer: Medicare HMO | Admitting: Internal Medicine

## 2013-08-10 ENCOUNTER — Encounter: Payer: Self-pay | Admitting: Internal Medicine

## 2013-08-10 VITALS — BP 148/68 | HR 60 | Ht 67.0 in | Wt 150.0 lb

## 2013-08-10 DIAGNOSIS — I2510 Atherosclerotic heart disease of native coronary artery without angina pectoris: Secondary | ICD-10-CM

## 2013-08-10 DIAGNOSIS — I10 Essential (primary) hypertension: Secondary | ICD-10-CM

## 2013-08-10 DIAGNOSIS — I5042 Chronic combined systolic (congestive) and diastolic (congestive) heart failure: Secondary | ICD-10-CM

## 2013-08-10 MED ORDER — CARVEDILOL 6.25 MG PO TABS: 6.2500 mg | ORAL_TABLET | Freq: Two times a day (BID) | ORAL | Status: DC

## 2013-08-10 NOTE — Progress Notes (Signed)
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  He ha a remote history of CABG (1998).  He had MI in 2003 and underwent PCI.  He reports having additional stents in 2010.  His EF was 40% by cath in 2011.  He has done well from a CV standpoint.  He was previously on toprol but developed progressive fatigue and ED and therefore this medicine was discontinued.  He has not recently been on a beta blocker.   He exercises 3 times per week without limitation.  He remains very active and enjoys playing with his grandchildren.  Recently he underwent myoview for further CV risk stratification.  This revealed EF 28% with a medium size defect of moderate severity affecting the apical anterior, mid anterior and mid anteroseptal segments with global hypokinesis.   After review of this study by Dr Katrinka Blazing, the patient is referred for EP consultaiton.  Today, he denies symptoms of palpitations, chest pain, shortness of breath, orthopnea, PND, lower extremity edema, dizziness, presyncope, syncope, or neurologic sequela. The patient is tolerating medications without difficulties and is otherwise without complaint today.   Past Medical History  Diagnosis Date  . CAD (coronary artery disease)   . Sleep apnea   . Diabetes mellitus   . Ischemic cardiomyopathy   . Hypertension   . Anemia   . Chronic systolic dysfunction of left ventricle   . DVT (deep venous thrombosis)   . Peripheral vascular disease   . Cellulitis Dec./Jan 2011 and 2012   Past Surgical History  Procedure Laterality Date  . Coronary artery bypass graft  Sept. 1998  . Arterial thrombectomy  2003    X's 2 stents  . Stents surgery  2007  and  2010    [facility]  . Knee surgery  1961    Right knee    Current Outpatient Prescriptions  Medication Sig  Dispense Refill  . acetaminophen (TYLENOL) 325 MG tablet Take 325 mg by mouth 2 (two) times daily.      Marland Kitchen aspirin 81 MG tablet Take 81 mg by mouth daily.      Marland Kitchen azelastine (OPTIVAR) 0.05 % ophthalmic solution Place 1 drop into both eyes daily as needed. To affected eyes        . cholecalciferol (VITAMIN D) 1000 UNITS tablet Take 1,000 Units by mouth daily.        . clobetasol ointment (TEMOVATE) 0.05 % Apply 1 application topically 2 (two) times daily as needed. To affected area; do not use TAZORAC       . CORDRAN 4 MCG/SQCM TAPE as needed. Apply to hands      . doxazosin (CARDURA) 4 MG tablet Take 4 mg by mouth daily.        . ferrous sulfate 325 (65 FE) MG tablet Take 325 mg by mouth daily.       . furosemide (LASIX) 80 MG tablet Take 0.5 tablets (40 mg total) by mouth daily.  30 tablet  3  . insulin glargine (LANTUS) 100 UNIT/ML injection Inject 30 Units into the skin every morning.        . latanoprost (XALATAN) 0.005 % ophthalmic solution Place 1 drop into both eyes daily. For pressure      . lisinopril (PRINIVIL,ZESTRIL) 20 MG tablet Take 20  mg by mouth daily.       . montelukast (SINGULAIR) 10 MG tablet Take 10 mg by mouth daily.       Marland Kitchen. NASONEX 50 MCG/ACT nasal spray Place 2 sprays into the nose as needed.       . nitroGLYCERIN (NITROSTAT) 0.4 MG SL tablet Place 1 tablet (0.4 mg total) under the tongue every 5 (five) minutes as needed. For chest pain  25 tablet  5  . Polyethyl Glycol-Propyl Glycol (SYSTANE OP) Apply 1 drop to eye as needed.       . rosuvastatin (CRESTOR) 10 MG tablet Take 10 mg by mouth daily.        Marland Kitchen. spironolactone (ALDACTONE) 25 MG tablet TAKE 1/2 TABLET DAILY.  15 tablet  5  . testosterone cypionate (DEPOTESTOTERONE CYPIONATE) 200 MG/ML injection every 21 ( twenty-one) days.      Marland Kitchen. triamcinolone (NASACORT AQ) 55 MCG/ACT nasal inhaler Place 2 sprays into the nose as needed. For congestion        . vardenafil (LEVITRA) 20 MG tablet Take 1 tablet (20 mg total) by  mouth daily as needed for erectile dysfunction.  12 tablet  11  . vitamin B-12 (CYANOCOBALAMIN) 1000 MCG tablet Take 1,000 mcg by mouth daily.      . carvedilol (COREG) 6.25 MG tablet Take 1 tablet (6.25 mg total) by mouth 2 (two) times daily.  180 tablet  3   No current facility-administered medications for this visit.    Allergies  Allergen Reactions  . Mercury Swelling  . Clopidogrel Bisulfate     REACTION: rash  . Contrast Media [Iodinated Diagnostic Agents] Swelling    Premedication with prednisone  . Iodine Swelling  . Latex     Oral nausea  . Sulfonamide Derivatives     unknown    History   Social History  . Marital Status: Married    Spouse Name: N/A    Number of Children: N/A  . Years of Education: N/A   Occupational History  . Not on file.   Social History Main Topics  . Smoking status: Never Smoker   . Smokeless tobacco: Never Used  . Alcohol Use: No  . Drug Use: No  . Sexual Activity: Not on file   Other Topics Concern  . Not on file   Social History Narrative   [place] in [place].  Scientist, research (medical)nvestment advisor.  Enjoys spending time with grandchildren.    Family History  Problem Relation Age of Onset  . Hypertension Mother   . Deep vein thrombosis Mother   . Heart disease Father     Heart Disease before age 71  . Deep vein thrombosis Sister     Amputation  . Diabetes Brother   . Heart disease Brother     Anerysm stomach  . Hypertension Brother   . Deep vein thrombosis Brother     Amputation  . Heart disease Brother     Heart disease before age 71  . Hypertension Brother     ROS- All systems are reviewed and negative except as per the HPI above  Physical Exam: Filed Vitals:   08/10/13 1134  BP: 148/68  Pulse: 60  Height: 5\' 7"  (1.702 m)  Weight: 150 lb (68.04 kg)    GEN- The patient is well appearing, alert and oriented x 3 today.   Head- normocephalic, atraumatic Eyes-  Sclera clear, conjunctiva pink Ears- hearing  intact Oropharynx- clear Neck- supple, no JVP Lymph- no cervical lymphadenopathy  Lungs- Clear to ausculation bilaterally, normal work of breathing Heart- Regular rate and rhythm, no murmurs, rubs or gallops, PMI not laterally displaced GI- soft, NT, ND, + BS Extremities- no clubbing, cyanosis, or edema MS- no significant deformity or atrophy Skin- no rash or lesion Psych- euthymic mood, full affect Neuro- strength and sensation are intact  EKG today reveals sinus rhythm 60 bpm, PR 210, LVH myoview is reviewed Dr Lonn Georgia notes are reviewd 2011 cath/ discharge summary and other epic records are reviewed  Assessment and Plan:   The patient has an ischemic CM (EF 28%), NYHA Class II CHF, and CAD.  He has not previously tolerated beta blocker therapy but is otherwise optimized.   We had a long discussion today about his risks of sudden death.  He is aware that he does meet criteria for ICD implantation for primary prevention of sudden death.  He is very conservative in his decision making and would like to avoid proceeding with an ICD presently.  He would favor additional medical therapy.  He recently read an article which revealed that paxil improved EF in rat models.  He asks about paxil as an option for him.  I have informed him that we do not have adequate data to guide [place] to place him on an SSRI for cardiac remodeling at this time.   I did recommend that we try a different beta blocker.  After speaking with Dr Katrinka Blazing today, we have decided collectively to try coreg 6.25mg  BID which can be further uptitrated.  He will follow-up with Dr Katrinka Blazing in 6-8 weeks.  Once his coreg dose is optimized, then he would benefit from a repeat echo.  I will stop diltiazem today to allow for adequate heart rate for initiation of coreg. If he decides to pursue ICD implant in the future then I am happy to discuss with him further.  Otherwise, he will follow with Dr Katrinka Blazing and I will see as needed going forward.

## 2013-08-10 NOTE — Patient Instructions (Signed)
Your physician recommends that you schedule a follow-up appointment in: 6-8 weeks with Dr Katrinka BlazingSmith   Your physician has recommended you make the following change in your medication:  1) Stop Diltiazem 2) Start Carvedilol 6.25mg  twice daily

## 2013-09-08 ENCOUNTER — Other Ambulatory Visit: Payer: Self-pay | Admitting: Interventional Cardiology

## 2013-09-15 ENCOUNTER — Encounter: Payer: Self-pay | Admitting: Interventional Cardiology

## 2013-09-21 ENCOUNTER — Encounter: Payer: Self-pay | Admitting: Interventional Cardiology

## 2013-09-21 ENCOUNTER — Ambulatory Visit (INDEPENDENT_AMBULATORY_CARE_PROVIDER_SITE_OTHER): Payer: Medicare HMO | Admitting: Interventional Cardiology

## 2013-09-21 VITALS — BP 146/71 | HR 74 | Ht 67.0 in | Wt 153.0 lb

## 2013-09-21 DIAGNOSIS — I5042 Chronic combined systolic (congestive) and diastolic (congestive) heart failure: Secondary | ICD-10-CM

## 2013-09-21 DIAGNOSIS — I10 Essential (primary) hypertension: Secondary | ICD-10-CM

## 2013-09-21 DIAGNOSIS — N183 Chronic kidney disease, stage 3 unspecified: Secondary | ICD-10-CM

## 2013-09-21 DIAGNOSIS — I2510 Atherosclerotic heart disease of native coronary artery without angina pectoris: Secondary | ICD-10-CM

## 2013-09-21 MED ORDER — FUROSEMIDE 80 MG PO TABS: 80.0000 mg | ORAL_TABLET | Freq: Every day | ORAL | Status: DC

## 2013-09-21 NOTE — Progress Notes (Signed)
Patient ID: Ethan Vargas, male   DOB: 08-21-1940, 73 y.o.   MRN: [id number]    [address] [place], [place]  [address] Phone: [phone] Fax:  [phone]) [phone]  Date:  09/21/2013   ID:  Ethan Vargas, DOB 08-21-1940, MRN [id number]  PCP:  Lupita RaiderSHAW,KIMBERLEE, MD   ASSESSMENT:  1. Systolic heart failure, decompensated with addition of beta blocker therapy with noted increase in weight, dyspnea. 2. LVEF reduction to less than 30%. Patient refuses AICD at this time 3. Coronary artery disease stable without angina 4. Beta blocker side effects: possibly causing insomnia  PLAN:  1. Increase furosemide 80 mg per day. The patient is started taking spiral lactone 25 mg per day instead of the prescribed 12.5 mg per day. Bypass in the go back to the 12-1/2 mg dose 2. Basic metabolic panel in one week 3. Clinical followup in one month for carvedilol titration 4. Patient is being started on Paxil by Dr. Clelia CroftShaw 5. After optimizing heart failure therapy, will repeat echo in 3-6 months.    SUBJECTIVE: Ethan Vargas is a 73 y.o. male who saw Dr. Johney FrameAllred about a month ago. He was referred to have consideration of AICD. The patient on that the idea of implantation and Dr. Johney FrameAllred came up with the plan of optimizing heart failure therapy. We have tried beta blockers in the past the patient's been intolerant. On the visit with Dr. Johney FrameAllred the beta blocker, carvedilol 6.25 mg twice a day was started. Diltiazem was discontinued. Since this change is been made he is noted some dyspnea on exertion, orthopnea, and decreased energy. He's had no angina. He denies syncope. There has been some lower extremity swelling. When he noted the swelling, he increased Aldactone to 25 mg per day instead of 12.5 mg per day. This has helped the swelling somewhat. The breathing is not improved.   Wt Readings from Last 3 Encounters:  09/21/13 153 lb (69.4 kg)  08/10/13 150 lb (68.04 kg)  08/04/13 150 lb (68.04  kg)     Past Medical History  Diagnosis Date  . CAD (coronary artery disease)   . Sleep apnea   . Diabetes mellitus   . Ischemic cardiomyopathy   . Hypertension   . Anemia   . Chronic systolic dysfunction of left ventricle   . DVT (deep venous thrombosis)   . Peripheral vascular disease   . Cellulitis Dec./Jan 2011 and 2012    Current Outpatient Prescriptions  Medication Sig Dispense Refill  . acetaminophen (TYLENOL) 325 MG tablet Take 325 mg by mouth 2 (two) times daily.      Marland Kitchen. aspirin 81 MG tablet Take 81 mg by mouth daily.      Marland Kitchen. azelastine (OPTIVAR) 0.05 % ophthalmic solution Place 1 drop into both eyes daily as needed. To affected eyes        . carvedilol (COREG) 6.25 MG tablet Take 1 tablet (6.25 mg total) by mouth 2 (two) times daily.  180 tablet  3  . cholecalciferol (VITAMIN D) 1000 UNITS tablet Take 1,000 Units by mouth daily.        . clobetasol ointment (TEMOVATE) 0.05 % Apply 1 application topically 2 (two) times daily as needed. To affected area; do not use TAZORAC       . CORDRAN 4 MCG/SQCM TAPE as needed. Apply to hands      . doxazosin (CARDURA) 4 MG tablet Take 4 mg by mouth daily.        .Marland Kitchen  ferrous sulfate 325 (65 FE) MG tablet Take 325 mg by mouth daily.       . furosemide (LASIX) 80 MG tablet Take 0.5 tablets (40 mg total) by mouth daily.  30 tablet  3  . insulin glargine (LANTUS) 100 UNIT/ML injection Inject 30 Units into the skin every morning.        . latanoprost (XALATAN) 0.005 % ophthalmic solution Place 1 drop into both eyes daily. For pressure      . lisinopril (PRINIVIL,ZESTRIL) 20 MG tablet Take 20 mg by mouth daily.       . montelukast (SINGULAIR) 10 MG tablet Take 10 mg by mouth daily.       Marland Kitchen. NASONEX 50 MCG/ACT nasal spray Place 2 sprays into the nose as needed.       . nitroGLYCERIN (NITROSTAT) 0.4 MG SL tablet Place 1 tablet (0.4 mg total) under the tongue every 5 (five) minutes as needed. For chest pain  25 tablet  5  . PARoxetine HCl (PAXIL PO)  Take by mouth.      Bertram Gala. Polyethyl Glycol-Propyl Glycol (SYSTANE OP) Apply 1 drop to eye as needed.       . rosuvastatin (CRESTOR) 10 MG tablet Take 10 mg by mouth daily.        Marland Kitchen. spironolactone (ALDACTONE) 25 MG tablet TAKE 1/2 TABLET DAILY.  15 tablet  1  . testosterone cypionate (DEPOTESTOTERONE CYPIONATE) 200 MG/ML injection every 21 ( twenty-one) days.      Marland Kitchen. triamcinolone (NASACORT AQ) 55 MCG/ACT nasal inhaler Place 2 sprays into the nose as needed. For congestion        . vardenafil (LEVITRA) 20 MG tablet Take 1 tablet (20 mg total) by mouth daily as needed for erectile dysfunction.  12 tablet  11  . vitamin B-12 (CYANOCOBALAMIN) 1000 MCG tablet Take 1,000 mcg by mouth daily.       No current facility-administered medications for this visit.    Allergies:    Allergies  Allergen Reactions  . Mercury Swelling  . Clopidogrel Bisulfate     REACTION: rash  . Contrast Media [Iodinated Diagnostic Agents] Swelling    Premedication with prednisone  . Iodine Swelling  . Latex     Oral nausea  . Sulfonamide Derivatives     unknown    Social History:  The patient  reports that he has never smoked. He has never used smokeless tobacco. He reports that he does not drink alcohol or use illicit drugs.   ROS:  Please see the history of present illness.   Appetite is stable. Lower extremity swelling improved with autoregulation of diuretic therapy   All other systems reviewed and negative.   OBJECTIVE: VS:  BP 146/71  Pulse 74  Ht 5\' 7"  (1.702 m)  Wt 153 lb (69.4 kg)  BMI 23.96 kg/m2 Well nourished, well developed, in no acute distress, elderly HEENT: normal Neck: JVD moderately elevated sitting at 90. Carotid bruit absent  Cardiac:  normal S1, S2; RRR;  2-3/6 systolic apical murmur of atrial fib Lungs:  clear to auscultation bilaterally, no wheezing, rhonchi or rales Abd: soft, nontender, no hepatomegaly Ext: Edema left greater than right. Pulses difficult to palpate Skin: warm and  dry Neuro:  CNs 2-12 intact, no focal abnormalities noted  EKG:  Not repeated       Signed, Darci NeedleHenry W. B. Kery Batzel III, MD 09/21/2013 4:16 PM

## 2013-09-21 NOTE — Patient Instructions (Addendum)
Your physician has recommended you make the following change in your medication:  1) INCREASE Lasix to 80mg  daily 2) DECREASE Spironolactone to 1/2 tablet 12.5mg  daily  Your physician recommends that you return for lab work in: 1 week a bmet 09/28/13 between 7:30am-5:15pm  You have a follow up  appt scheduled on 10/22/13 @8 :45am

## 2013-09-28 ENCOUNTER — Other Ambulatory Visit (INDEPENDENT_AMBULATORY_CARE_PROVIDER_SITE_OTHER): Payer: Medicare HMO

## 2013-09-28 DIAGNOSIS — I5042 Chronic combined systolic (congestive) and diastolic (congestive) heart failure: Secondary | ICD-10-CM

## 2013-09-29 LAB — BASIC METABOLIC PANEL
BUN: 23 mg/dL (ref 6–23)
CALCIUM: 9.1 mg/dL (ref 8.4–10.5)
CO2: 30 mEq/L (ref 19–32)
Chloride: 103 mEq/L (ref 96–112)
Creatinine, Ser: 1.7 mg/dL — ABNORMAL HIGH (ref 0.4–1.5)
GFR: 43.03 mL/min — AB (ref >60.00)
GLUCOSE: 159 mg/dL — AB (ref 70–99)
Potassium: 4 mEq/L (ref 3.5–5.1)
SODIUM: 140 meq/L (ref 135–145)

## 2013-09-29 NOTE — Progress Notes (Signed)
Quick Note:  Preliminary report reviewed by triage nurse and sent to MD desk. ______ 

## 2013-09-30 ENCOUNTER — Telehealth: Payer: Self-pay

## 2013-09-30 NOTE — Telephone Encounter (Signed)
Message copied by Jarvis NewcomerPARRIS-GODLEY, Osten Janek S on Wed Sep 30, 2013  3:54 PM ------      Message from: Verdis PrimeSMITH, HENRY      Created: Tue Sep 29, 2013  5:54 PM       Labs are stable. Has he improved relative to swelling? ------

## 2013-09-30 NOTE — Telephone Encounter (Signed)
called to give pt lasb results.lmom for pt to call back

## 2013-10-01 NOTE — Telephone Encounter (Signed)
Follow up ° ° ° ° °Returning Lisa's call to get lab results °

## 2013-10-01 NOTE — Telephone Encounter (Signed)
Follow up  ° ° °Patient returning call back to nurse  °

## 2013-10-02 NOTE — Telephone Encounter (Signed)
pt given lab results.Labs are stable. Has he improved relative to swelling?pt sts that swelliong has improved a little.pt sts that he thinks aldactone has been more effective with his swelling.adv pt I would fwd Dr.Smith the update.pt verbalized understanding

## 2013-10-02 NOTE — Telephone Encounter (Signed)
Message copied by Jarvis NewcomerPARRIS-GODLEY, LISA S on Fri Oct 02, 2013  9:52 AM ------      Message from: Verdis PrimeSMITH, HENRY      Created: Tue Sep 29, 2013  5:54 PM       Labs are stable. Has he improved relative to swelling? ------

## 2013-10-22 ENCOUNTER — Ambulatory Visit (INDEPENDENT_AMBULATORY_CARE_PROVIDER_SITE_OTHER): Payer: Medicare HMO | Admitting: Interventional Cardiology

## 2013-10-22 ENCOUNTER — Encounter: Payer: Self-pay | Admitting: Interventional Cardiology

## 2013-10-22 VITALS — BP 124/62 | HR 68 | Ht 67.0 in | Wt 151.0 lb

## 2013-10-22 DIAGNOSIS — I5042 Chronic combined systolic (congestive) and diastolic (congestive) heart failure: Secondary | ICD-10-CM

## 2013-10-22 DIAGNOSIS — I10 Essential (primary) hypertension: Secondary | ICD-10-CM

## 2013-10-22 DIAGNOSIS — I2510 Atherosclerotic heart disease of native coronary artery without angina pectoris: Secondary | ICD-10-CM

## 2013-10-22 MED ORDER — CARVEDILOL 12.5 MG PO TABS: 12.5000 mg | ORAL_TABLET | Freq: Two times a day (BID) | ORAL | Status: DC

## 2013-10-22 NOTE — Patient Instructions (Addendum)
Your physician has recommended you make the following change in your medication:  1) INCREASE Carvedilol to 12.5mg  twice daily. An Rx has been sent to your pharmacy Take all other medications as prescribed  You have a follow up appointment scheduled for 01/22/14 @8 :45 am

## 2013-10-22 NOTE — Progress Notes (Signed)
Patient ID: Ethan Vargas, male   DOB: 09/02/1940, 73 y.o.   MRN: [id number]    [address] [place], [place]  [address] Phone: [phone] Fax:  [phone]  Date:  10/22/2013   ID:  Ethan Vargas, DOB 09/02/1940, MRN [id number]  PCP:  Lupita RaiderSHAW,KIMBERLEE, MD   ASSESSMENT:  1. Systolic heart failure, chronic, with control of dyspnea and edema on current medical regimen. 2. Coronary artery disease without angina 3. Fatigue and depression  PLAN:  1. increase carvedilol to 9.375 mg tablets one half tablets twice a day 2. Clinical followup in 3 months   SUBJECTIVE: Ethan Vargas is a 73 y.o. male still complains of fatigue and lethargy. He stopped exercising. Antidepressive therapy has not made a big difference in the way he feels.   Wt Readings from Last 3 Encounters:  10/22/13 151 lb (68.493 kg)  09/21/13 153 lb (69.4 kg)  08/10/13 150 lb (68.04 kg)     Past Medical History  Diagnosis Date  . CAD (coronary artery disease)   . Sleep apnea   . Diabetes mellitus   . Ischemic cardiomyopathy   . Hypertension   . Anemia   . Chronic systolic dysfunction of left ventricle   . DVT (deep venous thrombosis)   . Peripheral vascular disease   . Cellulitis Dec./Jan 2011 and 2012    Current Outpatient Prescriptions  Medication Sig Dispense Refill  . acetaminophen (TYLENOL) 325 MG tablet Take 325 mg by mouth 2 (two) times daily.      Marland Kitchen. aspirin 81 MG tablet Take 81 mg by mouth daily.      Marland Kitchen. azelastine (OPTIVAR) 0.05 % ophthalmic solution Place 1 drop into both eyes daily as needed. To affected eyes        . carvedilol (COREG) 6.25 MG tablet Take 1 tablet (6.25 mg total) by mouth 2 (two) times daily.  180 tablet  3  . cholecalciferol (VITAMIN D) 1000 UNITS tablet Take 1,000 Units by mouth daily.        . clobetasol ointment (TEMOVATE) 0.05 % Apply 1 application topically 2 (two) times daily as needed. To affected area; do not use TAZORAC       . CORDRAN 4  MCG/SQCM TAPE as needed. Apply to hands      . doxazosin (CARDURA) 4 MG tablet Take 4 mg by mouth daily.        . ferrous sulfate 325 (65 FE) MG tablet Take 325 mg by mouth daily.       . furosemide (LASIX) 80 MG tablet Take 1 tablet (80 mg total) by mouth daily.      . insulin glargine (LANTUS) 100 UNIT/ML injection Inject 30 Units into the skin every morning.        . latanoprost (XALATAN) 0.005 % ophthalmic solution Place 1 drop into both eyes daily. For pressure      . LEVEMIR FLEXTOUCH 100 UNIT/ML Pen as directed.      Marland Kitchen. lisinopril (PRINIVIL,ZESTRIL) 20 MG tablet Take 20 mg by mouth daily.       . montelukast (SINGULAIR) 10 MG tablet Take 10 mg by mouth daily.       Marland Kitchen. NASONEX 50 MCG/ACT nasal spray Place 2 sprays into the nose as needed.       . nitroGLYCERIN (NITROSTAT) 0.4 MG SL tablet Place 1 tablet (0.4 mg total) under the tongue every 5 (five) minutes as needed. For chest pain  25 tablet  5  .  PARoxetine HCl (PAXIL PO) Take by mouth.      Bertram Gala Glycol-Propyl Glycol (SYSTANE OP) Apply 1 drop to eye as needed.       . rosuvastatin (CRESTOR) 10 MG tablet Take 10 mg by mouth daily.        Marland Kitchen spironolactone (ALDACTONE) 25 MG tablet TAKE 1/2 TABLET DAILY.  15 tablet  1  . testosterone cypionate (DEPOTESTOTERONE CYPIONATE) 200 MG/ML injection every 21 ( twenty-one) days.      . vardenafil (LEVITRA) 20 MG tablet Take 1 tablet (20 mg total) by mouth daily as needed for erectile dysfunction.  12 tablet  11  . vitamin B-12 (CYANOCOBALAMIN) 1000 MCG tablet Take 1,000 mcg by mouth daily.       No current facility-administered medications for this visit.    Allergies:    Allergies  Allergen Reactions  . Mercury Swelling  . Clopidogrel Bisulfate     REACTION: rash  . Contrast Media [Iodinated Diagnostic Agents] Swelling    Premedication with prednisone  . Iodine Swelling  . Latex     Oral nausea  . Sulfonamide Derivatives     unknown    Social History:  The patient  reports that  he has never smoked. He has never used smokeless tobacco. He reports that he does not drink alcohol or use illicit drugs.   ROS:  Please see the history of present illness.   Not exercising. Sleeping a lot. Appetite is good. Lower extremity edema has completely resolved.   All other systems reviewed and negative.   OBJECTIVE: VS:  BP 124/62  Pulse 68  Ht 5\' 7"  (1.702 m)  Wt 151 lb (68.493 kg)  BMI 23.64 kg/m2 Well nourished, well developed, in no acute distress, elderly HEENT: normal Neck: JVD moderate elevation was sitting at 90. Carotid bruit absent  Cardiac:  normal S1, S2; RRR; 1-2 of 6 apical systolic murmur Lungs:  clear to auscultation bilaterally, no wheezing, rhonchi or rales Abd: soft, nontender, no hepatomegaly Ext: Edema trace left lower extremity edema absent right. Pulses trace to 1+ bilateral Skin: warm and dry Neuro:  CNs 2-12 intact, no focal abnormalities noted  EKG:  Not repeated       Signed, Darci Needle III, MD 10/22/2013 9:05 AM

## 2013-11-06 ENCOUNTER — Other Ambulatory Visit: Payer: Self-pay | Admitting: Interventional Cardiology

## 2013-12-29 ENCOUNTER — Other Ambulatory Visit: Payer: Self-pay | Admitting: Interventional Cardiology

## 2014-01-22 ENCOUNTER — Encounter: Payer: Self-pay | Admitting: Interventional Cardiology

## 2014-01-22 ENCOUNTER — Ambulatory Visit (INDEPENDENT_AMBULATORY_CARE_PROVIDER_SITE_OTHER): Payer: Medicare HMO | Admitting: Interventional Cardiology

## 2014-01-22 VITALS — BP 124/68 | HR 66 | Ht 67.0 in | Wt 152.0 lb

## 2014-01-22 DIAGNOSIS — N183 Chronic kidney disease, stage 3 unspecified: Secondary | ICD-10-CM

## 2014-01-22 DIAGNOSIS — I2510 Atherosclerotic heart disease of native coronary artery without angina pectoris: Secondary | ICD-10-CM

## 2014-01-22 DIAGNOSIS — I5042 Chronic combined systolic (congestive) and diastolic (congestive) heart failure: Secondary | ICD-10-CM

## 2014-01-22 DIAGNOSIS — I10 Essential (primary) hypertension: Secondary | ICD-10-CM

## 2014-01-22 NOTE — Progress Notes (Signed)
Patient ID: Ethan Vargas, male   DOB: 08/22/1940, 73 y.o.   MRN: [id number]    [address] [place], [place]  [address] Phone: [phone] Fax:  [phone]  Date:  01/22/2014   ID:  Ethan, Vargas [birth year], MRN [id number]  PCP:  Lupita Raider, MD   ASSESSMENT:  1. Chronic systolic heart failure based on ischemic heart disease 2. Coronary artery disease without angina 3. Chronic kidney disease 4. Diabetes mellitus  PLAN:  1. 2-D Doppler echocardiogram in October 2. Clinical followup in November 3. We will determine if AICD as needed basis but EF obtained by echocardiography in October   SUBJECTIVE: Ethan Vargas is a 73 y.o. male who feels completely washed out since starting beta blocker therapy in uptitrating. He has stopped exercising. He feels sleepy all the time. He has not been dizzy or lightheaded. He sleeps on 2 pillows. Lower the edema has not recurred. He denies angina. Overall he is taking the increased beta blocker because we asked him to the feels is significantly decrease his quality of life.   Wt Readings from Last 3 Encounters:  01/22/14 152 lb (68.947 kg)  10/22/13 151 lb (68.493 kg)  09/21/13 153 lb (69.4 kg)     Past Medical History  Diagnosis Date  . CAD (coronary artery disease)   . Sleep apnea   . Diabetes mellitus   . Ischemic cardiomyopathy   . Hypertension   . Anemia   . Chronic systolic dysfunction of left ventricle   . DVT (deep venous thrombosis)   . Peripheral vascular disease   . Cellulitis Dec./Jan 2011 and 2012    Current Outpatient Prescriptions  Medication Sig Dispense Refill  . acetaminophen (TYLENOL) 325 MG tablet Take 325 mg by mouth 2 (two) times daily.      Marland Kitchen aspirin 81 MG tablet Take 81 mg by mouth daily.      Marland Kitchen azelastine (OPTIVAR) 0.05 % ophthalmic solution Place 1 drop into both eyes daily as needed. To affected eyes        . carvedilol (COREG) 12.5 MG tablet Take 1 tablet (12.5 mg  total) by mouth 2 (two) times daily.  60 tablet  5  . cholecalciferol (VITAMIN D) 1000 UNITS tablet Take 1,000 Units by mouth daily.        . clobetasol ointment (TEMOVATE) 0.05 % Apply 1 application topically 2 (two) times daily as needed. To affected area; do not use TAZORAC       . CORDRAN 4 MCG/SQCM TAPE as needed. Apply to hands      . doxazosin (CARDURA) 4 MG tablet Take 4 mg by mouth daily.        . ferrous sulfate 325 (65 FE) MG tablet Take 325 mg by mouth daily.       . furosemide (LASIX) 80 MG tablet Take 1 tablet (80 mg total) by mouth daily.      . insulin glargine (LANTUS) 100 UNIT/ML injection Inject 30 Units into the skin every morning.        Marland Kitchen LEVEMIR FLEXTOUCH 100 UNIT/ML Pen as directed. TAKE 30 UNITS      . lisinopril (PRINIVIL,ZESTRIL) 20 MG tablet Take 20 mg by mouth daily.       . montelukast (SINGULAIR) 10 MG tablet Take 10 mg by mouth daily.       Marland Kitchen NASONEX 50 MCG/ACT nasal spray Place 2 sprays into the nose as needed.       Marland Kitchen  nitroGLYCERIN (NITROSTAT) 0.4 MG SL tablet Place 1 tablet (0.4 mg total) under the tongue every 5 (five) minutes as needed. For chest pain  25 tablet  5  . PARoxetine HCl (PAXIL PO) Take by mouth.      Bertram Gala Glycol-Propyl Glycol (SYSTANE OP) Apply 1 drop to eye as needed.       . rosuvastatin (CRESTOR) 10 MG tablet Take 10 mg by mouth daily.        Marland Kitchen spironolactone (ALDACTONE) 25 MG tablet TAKE 1/2 TABLET DAILY.  15 tablet  0  . testosterone cypionate (DEPOTESTOTERONE CYPIONATE) 200 MG/ML injection every 21 ( twenty-one) days.      . vardenafil (LEVITRA) 20 MG tablet Take 1 tablet (20 mg total) by mouth daily as needed for erectile dysfunction.  12 tablet  11  . vitamin B-12 (CYANOCOBALAMIN) 1000 MCG tablet Take 1,000 mcg by mouth daily.       No current facility-administered medications for this visit.    Allergies:    Allergies  Allergen Reactions  . Mercury Swelling  . Clopidogrel Bisulfate     REACTION: rash  . Contrast Media  [Iodinated Diagnostic Agents] Swelling    Premedication with prednisone  . Iodine Swelling  . Latex     Oral nausea  . Sulfonamide Derivatives     unknown    Social History:  The patient  reports that he has never smoked. He has never used smokeless tobacco. He reports that he does not drink alcohol or use illicit drugs.   ROS:  Please see the history of present illness.   Appetite is stable   All other systems reviewed and negative.   OBJECTIVE: VS:  BP 124/68  Pulse 66  Ht  (1.702 m)  Wt 152 lb (68.947 kg)  BMI 23.80 kg/m2 Well nourished, well developed, in no acute distress, appears elderly HEENT: normal Neck: JVD flat. Carotid bruit absent  Cardiac:  normal S1, S2; RRR; no murmur Lungs:  clear to auscultation bilaterally, no wheezing, rhonchi or rales Abd: soft, nontender, no hepatomegaly Ext: Edema trace to 1+ left lower extremity. Pulses 2+ Skin: warm and dry Neuro:  CNs 2-12 intact, no focal abnormalities noted  EKG:  Not performed       Signed, Darci Needle III, MD 01/22/2014 9:08 AM

## 2014-01-22 NOTE — Patient Instructions (Signed)
Your physician has requested that you have an echocardiogram in late October . Echocardiography is a painless test that uses sound waves to create images of your heart. It provides your doctor with information about the size and shape of your heart and how well your heart's chambers and valves are working. This procedure takes approximately one hour. There are no restrictions for this procedure.  Your physician recommends that you schedule a follow-up appointment in: Early/mid November with Dr Katrinka Blazing  Your physician recommends that you continue on your current medications as directed. Please refer to the Current Medication list given to you today.

## 2014-01-27 ENCOUNTER — Other Ambulatory Visit: Payer: Self-pay | Admitting: Interventional Cardiology

## 2014-02-15 ENCOUNTER — Telehealth: Payer: Self-pay | Admitting: Interventional Cardiology

## 2014-02-15 NOTE — Telephone Encounter (Signed)
Patient called in with SOB and no energy, sent call to [facility] in Triage.

## 2014-02-15 NOTE — Telephone Encounter (Signed)
**Note De-Identified Dennette Faulconer Obfuscation** The pt c/o SOB, no energy, edema in ankles and not sleeping well. Per Dr Clifton James, DOD, the pt needs an appointment soon. Appointment scheduled with Lucile Crater on 9/23 at 1:15, the pt agrees with plan.

## 2014-02-17 ENCOUNTER — Ambulatory Visit (INDEPENDENT_AMBULATORY_CARE_PROVIDER_SITE_OTHER): Payer: 59 | Admitting: Physician Assistant

## 2014-02-17 ENCOUNTER — Encounter: Payer: Self-pay | Admitting: Physician Assistant

## 2014-02-17 VITALS — BP 124/62 | HR 59 | Ht 67.0 in | Wt 158.0 lb

## 2014-02-17 DIAGNOSIS — I255 Ischemic cardiomyopathy: Secondary | ICD-10-CM

## 2014-02-17 DIAGNOSIS — N183 Chronic kidney disease, stage 3 unspecified: Secondary | ICD-10-CM

## 2014-02-17 DIAGNOSIS — I509 Heart failure, unspecified: Secondary | ICD-10-CM

## 2014-02-17 DIAGNOSIS — I5023 Acute on chronic systolic (congestive) heart failure: Secondary | ICD-10-CM

## 2014-02-17 DIAGNOSIS — E785 Hyperlipidemia, unspecified: Secondary | ICD-10-CM

## 2014-02-17 DIAGNOSIS — I10 Essential (primary) hypertension: Secondary | ICD-10-CM

## 2014-02-17 DIAGNOSIS — I2510 Atherosclerotic heart disease of native coronary artery without angina pectoris: Secondary | ICD-10-CM

## 2014-02-17 DIAGNOSIS — I2589 Other forms of chronic ischemic heart disease: Secondary | ICD-10-CM

## 2014-02-17 LAB — CBC
HEMATOCRIT: 37.4 % — AB (ref 39.0–52.0)
Hemoglobin: 12.2 g/dL — ABNORMAL LOW (ref 13.0–17.0)
MCHC: 32.6 g/dL (ref 30.0–36.0)
MCV: 89.7 fl (ref 78.0–100.0)
PLATELETS: 96 10*3/uL — AB (ref 150.0–400.0)
RBC: 4.17 Mil/uL — ABNORMAL LOW (ref 4.22–5.81)
RDW: 16.4 % — AB (ref 11.5–15.5)
WBC: 5.5 10*3/uL (ref 4.0–10.5)

## 2014-02-17 LAB — COMPREHENSIVE METABOLIC PANEL
ALBUMIN: 4 g/dL (ref 3.5–5.2)
ALK PHOS: 62 U/L (ref 39–117)
ALT: 35 U/L (ref 0–53)
AST: 39 U/L — ABNORMAL HIGH (ref 0–37)
BUN: 31 mg/dL — AB (ref 6–23)
CALCIUM: 9.1 mg/dL (ref 8.4–10.5)
CO2: 30 meq/L (ref 19–32)
Chloride: 104 mEq/L (ref 96–112)
Creatinine, Ser: 1.8 mg/dL — ABNORMAL HIGH (ref 0.4–1.5)
GFR: 40.47 mL/min — AB (ref >60.00)
GLUCOSE: 74 mg/dL (ref 70–99)
POTASSIUM: 3.7 meq/L (ref 3.5–5.1)
Sodium: 140 mEq/L (ref 135–145)
Total Bilirubin: 1.1 mg/dL (ref 0.2–1.2)
Total Protein: 7.6 g/dL (ref 6.0–8.3)

## 2014-02-17 LAB — BRAIN NATRIURETIC PEPTIDE: Pro B Natriuretic peptide (BNP): 1693 pg/mL — ABNORMAL HIGH (ref 0.0–100.0)

## 2014-02-17 LAB — TSH: TSH: 2.56 u[IU]/mL (ref 0.35–4.50)

## 2014-02-17 MED ORDER — FUROSEMIDE 80 MG PO TABS: 80.0000 mg | ORAL_TABLET | Freq: Every day | ORAL | Status: DC

## 2014-02-17 NOTE — Progress Notes (Signed)
[address] [place], [place]  [address] Phone: [phone] Fax:  [phone]  Date:  02/17/2014   Patient ID:  Ethan Vargas, Ethan Vargas 1941/04/22, MRN [id number]   PCP:  Lupita Raider, MD  Cardiologist:  Katrinka Blazing Electrophysiologist:  Allred  History of Present Illness: Ethan Vargas is a 73 y.o. male with history of CAD s/p remote CABG 1998 with subsequent stenting as outlined below (last PCI in 2010), ICM, DM, HTN, HLD, CKD stage IIII, RAS s/p stent 2007, anemia of chronic disease who presents for follow-up. Last cath 2011: CONCLUSION: 1. 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  The saphenous vein graft to the obtuse marginal is widely patent. 2. Widely patent LIMA to the LAD. 3. Severe native vessel coronary disease with total occlusion of the mid LAD, the circumflex, and the RCA. 4. Left ventricular dysfunction with inferoapical akinesis/aneurysm and estimated ejection fraction of 40-45%.  He was seen by Dr. Katrinka Blazing in 06/2013 and was doing relatively well. He underwent stress nuclear perfusion imaging to re-evaluate perfusion/LV function in 07/2013: "Intermediate risk stress nuclear study. There is a medium size defect of moderate severity affecting the apical anterior, mid anterior and mid anteroseptal segments. (Fixed defect.) There is global hypokinesis. EF 28%." He has not previously tolerated BB therapy well due to feeling washed out but was re-challenged with Coreg earlier this year. The plan is for re-echo in October then reassessment for ICD candidacy.  He returns for follow-up feeling more SOB than usual. He reports easy exertional DOE. He also has orthopnea, restlessness, increased LEE, sensation of abdominal fullness, early satiety and 6 lb weight gain. He has chronic L>R LE  edema which was previously evaluated with negative LE duplex for DVT. No SOB at rest. No bleeding noted. He has not had any significant chest pain. (Prior to CABG/PCIs, this was present.) He has been taking Lasix  for quite some time. Several days ago he increased his spironolactone from 12.5mg  to  to see if this would help but it hasn't. He does admit to a diet with "more salt than I should."  Recent Labs: 09/28/2013: Creatinine 1.7*; Potassium 4.0   Wt Readings from Last 3 Encounters:  02/17/14 158 lb (71.668 kg)  01/22/14 152 lb (68.947 kg)  10/22/13 151 lb (68.493 kg)     Past Medical History  Diagnosis Date  . CAD (coronary artery disease)     a. Remote CABG 1998. b. Inf MI 2003 s/ DES to SVG-RCA. c. Additional stenting in 2010 - occluded SVG to RCA and DES to SVG-Diag#1. d. Nuc 07/2013 with Intermediate risk stress nuclear study.  There is a medium size defect of moderate severity affecting the apical anterior, mid anterior and mid anteroseptal segments. There is global hypokinesis.  EF 28%.  . Sleep apnea   . Diabetes mellitus     a. c/b neuropathy, nephropathy.  . Ischemic cardiomyopathy   . Hypertension   . Anemia of chronic disease   . Chronic systolic CHF (congestive heart failure)   . Peripheral vascular disease   . Cellulitis Dec./Jan 2011 and 2012  . CKD (chronic kidney disease), stage III   . Hyperlipidemia   . Barrett's esophageal ulceration   . Renal artery stenosis     a. s/p stent 2007.  Current Outpatient Prescriptions  Medication Sig Dispense Refill  . acetaminophen (TYLENOL) 325 MG tablet Take 325 mg by mouth 2 (two) times daily.      Marland Kitchen aspirin 81 MG tablet Take 81 mg by mouth daily.      Marland Kitchen azelastine (OPTIVAR) 0.05 % ophthalmic solution Place 1 drop into both eyes daily as needed. To affected eyes        . carvedilol (COREG) 12.5 MG tablet Take 1 tablet (12.5 mg total) by mouth 2 (two) times daily.  60 tablet  5  . cholecalciferol (VITAMIN D) 1000  UNITS tablet Take 1,000 Units by mouth daily.        . clobetasol ointment (TEMOVATE) 0.05 % Apply 1 application topically 2 (two) times daily as needed. To affected area; do not use TAZORAC       . CORDRAN 4 MCG/SQCM TAPE as needed. Apply to hands      . doxazosin (CARDURA) 4 MG tablet Take 4 mg by mouth daily.        . ferrous sulfate 325 (65 FE) MG tablet Take 325 mg by mouth daily.       . fluticasone (FLONASE) 50 MCG/ACT nasal spray Place 1 spray into both nostrils 2 (two) times daily.      . furosemide (LASIX) 80 MG tablet Take 1 tablet (80 mg total) by mouth daily.  30 tablet  5  . latanoprost (XALATAN) 0.005 % ophthalmic solution Place 1 drop into both eyes at bedtime.      Marland Kitchen LEVEMIR FLEXTOUCH 100 UNIT/ML Pen as directed. TAKE 30 UNITS      . lisinopril (PRINIVIL,ZESTRIL) 20 MG tablet Take 20 mg by mouth daily.       . montelukast (SINGULAIR) 10 MG tablet Take 10 mg by mouth daily.       . nitroGLYCERIN (NITROSTAT) 0.4 MG SL tablet Place 1 tablet (0.4 mg total) under the tongue every 5 (five) minutes as needed. For chest pain  25 tablet  5  . PARoxetine HCl (PAXIL PO) Take by mouth.      . rosuvastatin (CRESTOR) 10 MG tablet Take 10 mg by mouth daily.        Marland Kitchen spironolactone (ALDACTONE) 25 MG tablet Take 1 tablet daily      . testosterone cypionate (DEPOTESTOTERONE CYPIONATE) 200 MG/ML injection every 21 ( twenty-one) days.      . vardenafil (LEVITRA) 20 MG tablet Take 1 tablet (20 mg total) by mouth daily as needed for erectile dysfunction.  12 tablet  11  . vitamin B-12 (CYANOCOBALAMIN) 1000 MCG tablet Take 1,000 mcg by mouth daily.       No current facility-administered medications for this visit.    Allergies:   Mercury; Clopidogrel bisulfate; Contrast media; Iodine; Latex; and Sulfonamide derivatives   Social History:  The patient  reports that he has never smoked. He has never used smokeless tobacco. He reports that he does not drink alcohol or use illicit drugs.   Family  History:  The patient's family history includes Deep vein thrombosis in his brother, mother, and sister; Diabetes in his brother; Heart disease in his brother, brother, and father; Hypertension in his brother, brother, and mother.   ROS:  Please see the history of present illness.     All other systems reviewed and negative.   PHYSICAL EXAM:  VS:  BP 124/62  Pulse 59  Ht  (1.702 m)  Wt 158 lb (71.668 kg)  BMI 24.74 kg/m2 (initial BP  reported as 98 systolic but recheck by me WNL) Well nourished, well developed WM, in no acute distress HEENT: normal Neck: no JVD Cardiac:  normal S1, S2; RRR; no murmur Lungs:  Minimally diminished at bases but otherwise clear to auscultation bilaterally, no wheezing, rhonchi or rales Abd: soft, nontender, no hepatomegaly Ext: no edema, trace L>R LE Skin: warm and dry Neuro:  moves all extremities spontaneously, no focal abnormalities noted  EKG:  Sinus bradycardia 59bpm, one PVC noted, NSIVCD, nonspecific inferolateral ST-T changes, similarly seen on prior tracing  ASSESSMENT AND PLAN:  1. Acute on chronic systolic CHF/ischemic cardiomyopathy - suspect volume overload given increased dyspnea, weight gain, early satiety and orthopnea. Not tachycardic or tachypnic in the office. He has been taking Lasix  daily and spironolactone  daily. Will ask him to increase Lasix to  QAM/40mg  QPM x 3 days then resume Lasix  daily, as well as salt-restrict. He does not drink excess fluid. If weight not back to baseline after this, he should call the office at which time we may consider extending the increased dose for a longer period of time depending on renal function. Continue Coreg, lisinopril and spironolactone at present doses. F/u echo as planned in October to reassess LV function. Check labs including TSH, BNP, CMET, CBC (prior hx of anemia). 2. CKD stage III - obtain labs today to assess renal function and K.  3. CAD s/p CABG, PCI - no chest pain.  Continue aspirin, statin, beta blocker.  4. HTN - controlled.  5. Hyperlipidemia - f/u fasting lipids next OV. Obtaining CMET today.  Dispo: F/u 7-10 days with Dr. Katrinka Blazing or PA/NP (myself if I have availability.)  Signed, Ronie Spies, PA-C  02/17/2014 10:37 AM

## 2014-02-17 NOTE — Patient Instructions (Addendum)
Your physician has recommended you make the following change in your medication:   1. FOR 3 DAYS TAKE 80 MG LASIX IN AM AND 40 MG IN THE PM  THEN CONTINUE TO REGULAR DOSE OF LASIX 80 MG DAILY   Your physician recommends that you return for lab work in:  CBC, TSH,BNP, CMET TODAY   1. NEXT OFFICE VISIT  IN 7 TO 10 DAYS  LABS TO BE DRAWN FASTING LIPID      Your physician recommends that you schedule a follow-up appointment in: IN 7 TO 10 DAYS WITH DANA DUNN OR DR SMITH   IF WEIGHT NOT BACK TO BASE LINE CALL OFFICE

## 2014-02-18 ENCOUNTER — Telehealth: Payer: Self-pay | Admitting: *Deleted

## 2014-02-18 NOTE — Telephone Encounter (Signed)
pt notified about lab results with verbal understanding  

## 2014-02-26 ENCOUNTER — Ambulatory Visit (INDEPENDENT_AMBULATORY_CARE_PROVIDER_SITE_OTHER): Payer: 59 | Admitting: Physician Assistant

## 2014-02-26 ENCOUNTER — Other Ambulatory Visit (INDEPENDENT_AMBULATORY_CARE_PROVIDER_SITE_OTHER): Payer: 59 | Admitting: *Deleted

## 2014-02-26 ENCOUNTER — Telehealth: Payer: Self-pay

## 2014-02-26 ENCOUNTER — Encounter: Payer: Self-pay | Admitting: Physician Assistant

## 2014-02-26 VITALS — BP 122/60 | HR 64 | Ht 67.0 in | Wt 151.0 lb

## 2014-02-26 DIAGNOSIS — I5023 Acute on chronic systolic (congestive) heart failure: Secondary | ICD-10-CM

## 2014-02-26 DIAGNOSIS — I10 Essential (primary) hypertension: Secondary | ICD-10-CM

## 2014-02-26 DIAGNOSIS — R0600 Dyspnea, unspecified: Secondary | ICD-10-CM

## 2014-02-26 DIAGNOSIS — I2510 Atherosclerotic heart disease of native coronary artery without angina pectoris: Secondary | ICD-10-CM

## 2014-02-26 DIAGNOSIS — E785 Hyperlipidemia, unspecified: Secondary | ICD-10-CM

## 2014-02-26 DIAGNOSIS — I5042 Chronic combined systolic (congestive) and diastolic (congestive) heart failure: Secondary | ICD-10-CM

## 2014-02-26 DIAGNOSIS — N183 Chronic kidney disease, stage 3 unspecified: Secondary | ICD-10-CM

## 2014-02-26 LAB — BASIC METABOLIC PANEL
BUN: 25 mg/dL — ABNORMAL HIGH (ref 6–23)
CALCIUM: 9 mg/dL (ref 8.4–10.5)
CO2: 30 mEq/L (ref 19–32)
CREATININE: 1.7 mg/dL — AB (ref 0.4–1.5)
Chloride: 100 mEq/L (ref 96–112)
GFR: 43.27 mL/min — ABNORMAL LOW (ref >60.00)
Glucose, Bld: 239 mg/dL — ABNORMAL HIGH (ref 70–99)
Potassium: 3.5 mEq/L (ref 3.5–5.1)
Sodium: 139 mEq/L (ref 135–145)

## 2014-02-26 LAB — LIPID PANEL
CHOL/HDL RATIO: 4
Cholesterol: 113 mg/dL (ref 0–200)
HDL: 26.5 mg/dL — AB (ref >39.00)
LDL Cholesterol: 70 mg/dL (ref 0–99)
NONHDL: 86.5
Triglycerides: 83 mg/dL (ref 0.0–149.0)
VLDL: 16.6 mg/dL (ref 0.0–40.0)

## 2014-02-26 LAB — BRAIN NATRIURETIC PEPTIDE: Pro B Natriuretic peptide (BNP): 1547 pg/mL — ABNORMAL HIGH (ref 0.0–100.0)

## 2014-02-26 NOTE — Patient Instructions (Signed)
Your physician recommends that you continue on your current medications as directed. Please refer to the Current Medication list given to you today.  Lab Today: Bmet, Bnp  Your physician recommends that you schedule a follow-up appointment in: 2 weeks the the PA/NP

## 2014-02-26 NOTE — Telephone Encounter (Signed)
pt given Dr.Smith instructions to increase Lasix to 80mg  bid over the weekend today, sat, sun. pt to call the office on Mon morning with an update of symptoms. pt verbalized understanding.

## 2014-02-26 NOTE — Progress Notes (Signed)
[address] [place], [place]  [address] Phone: [phone] Fax:  [phone]  Date:  02/26/2014   Patient ID:  Ethan Vargas, Ethan Vargas [birth year], MRN [id number]   PCP:  Lupita Raider, MD  Cardiologist: Katrinka Blazing  Electrophysiologist: Allred   History of Present Illness: Ethan Vargas is a 73 y.o. male with history of CAD s/p remote CABG 1998 with subsequent stenting as outlined below (last PCI in 2010), ICM, DM, HTN, HLD, CKD stage IIII, RAS s/p stent 2007, anemia of chronic disease who presents for follow-up. Last cath 2011:  CONCLUSION:  1. 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 The saphenous vein graft to the obtuse marginal is widely patent.  2. Widely patent LIMA to the LAD.  3. Severe native vessel coronary disease with total occlusion of the mid LAD, the circumflex, and the RCA.  4. Left ventricular dysfunction with inferoapical akinesis/aneurysm and estimated ejection fraction of 40-45%.  He was seen by Dr. Katrinka Blazing in 06/2013 and was doing relatively well. He underwent stress nuclear perfusion imaging to re-evaluate perfusion/LV function in 07/2013: "Intermediate risk stress nuclear study. There is a medium size defect of moderate severity affecting the apical anterior, mid anterior and mid anteroseptal segments. (Fixed defect.) There is global hypokinesis. EF 28%." He has not previously tolerated BB therapy well due to feeling washed out but was re-challenged with Coreg earlier this year. The plan is for re-echo in October then reassessment for ICD candidacy. This is scheduled for 03/18/14.  I saw him on 02/17/14 for increased DOE, orthopnea, LEE, abdominal fullness, early satiety and weight gain. He has chronic L>R LE edema which was previously evaluated with negative LE duplex for  DVT. He was not having any chest pain. He had self-increased spironolactone from 12.5 to 25mg  to see if it would help but it did not. He was eating more salt than he should. CMET showed relatively stable Cr at 1.8, K 3.7, albumin 4.0, Hgb improved from prior, TSH nl and pBNP 1693. We increased Lasix to 80mg  QAM/40mg  QPM x 3 days then back to Lasix 80mg  daily.    He presents for follow-up today. Our changes resolved his orthopnea, but DOE persists about the same as before. Weight is down 7 lbs from last OV. He says by his home scale it's about 4 lbs with 2 more to go until his baseline. He still has not had any chest pain - with his prior CAD/PCIs, he always had chest pain in the form of angina. LEE initially improved with increased Lasix then went back to baseline. POx 98% in office and he is not tachycardic or tachypnic.  Recent Labs: 02/17/2014: ALT 35; Creatinine 1.8*; Hemoglobin 12.2*; Potassium 3.7; Pro B Natriuretic peptide (BNP) 1693.0*; TSH 2.56   Wt Readings from Last 3 Encounters:  02/26/14 151 lb (68.493 kg)  02/17/14 158 lb (71.668 kg)  01/22/14 152 lb (68.947 kg)     Past Medical History  Diagnosis Date  . CAD (coronary artery disease)     a. Remote CABG 1998. b. Inf MI 2003 s/ DES to SVG-RCA. c. Additional stenting in 2010 - occluded SVG to RCA and DES to SVG-Diag#1. d. Nuc 07/2013 with Intermediate risk stress nuclear study.  There is a medium size defect of moderate severity  affecting the apical anterior, mid anterior and mid anteroseptal segments. There is global hypokinesis.  EF 28%.  . Sleep apnea   . Diabetes mellitus     a. c/b neuropathy, nephropathy.  . Ischemic cardiomyopathy   . Hypertension   . Anemia of chronic disease   . Chronic systolic CHF (congestive heart failure)   . Peripheral vascular disease   . Cellulitis Dec./Jan 2011 and 2012  . CKD (chronic kidney disease), stage III   . Hyperlipidemia   . Barrett's esophageal ulceration   . Renal artery stenosis       a. s/p stent 2007.    Current Outpatient Prescriptions  Medication Sig Dispense Refill  . acetaminophen (TYLENOL) 325 MG tablet Take 325 mg by mouth 2 (two) times daily.      Marland Kitchen. aspirin EC 81 MG tablet Take 81 mg by mouth daily.      Marland Kitchen. azelastine (OPTIVAR) 0.05 % ophthalmic solution Place 1 drop into both eyes daily as needed. To affected eyes        . carvedilol (COREG) 12.5 MG tablet Take 1 tablet (12.5 mg total) by mouth 2 (two) times daily.  60 tablet  5  . cholecalciferol (VITAMIN D) 1000 UNITS tablet Take 1,000 Units by mouth daily.        . clobetasol ointment (TEMOVATE) 0.05 % Apply 1 application topically 2 (two) times daily as needed. To affected area; do not use TAZORAC       . CORDRAN 4 MCG/SQCM TAPE as needed. Apply to hands      . doxazosin (CARDURA) 4 MG tablet Take 4 mg by mouth daily.       . ferrous sulfate 325 (65 FE) MG tablet Take 325 mg by mouth daily.       . fluticasone (FLONASE) 50 MCG/ACT nasal spray Place 1 spray into both nostrils 2 (two) times daily.      . furosemide (LASIX) 80 MG tablet Take 1 tablet (80 mg total) by mouth daily.  30 tablet  5  . latanoprost (XALATAN) 0.005 % ophthalmic solution Place 1 drop into both eyes at bedtime.      Marland Kitchen. LEVEMIR FLEXTOUCH 100 UNIT/ML Pen as directed. TAKE 30 UNITS      . lisinopril (PRINIVIL,ZESTRIL) 20 MG tablet Take 20 mg by mouth daily.       . montelukast (SINGULAIR) 10 MG tablet Take 10 mg by mouth daily.       . nitroGLYCERIN (NITROSTAT) 0.4 MG SL tablet Place 1 tablet (0.4 mg total) under the tongue every 5 (five) minutes as needed. For chest pain  25 tablet  5  . PARoxetine HCl (PAXIL PO) Take by mouth. 1 tablet daily      . rosuvastatin (CRESTOR) 10 MG tablet Take 10 mg by mouth daily.        Marland Kitchen. spironolactone (ALDACTONE) 25 MG tablet Take 1 tablet daily      . testosterone cypionate (DEPOTESTOTERONE CYPIONATE) 200 MG/ML injection every 21 ( twenty-one) days.      . vardenafil (LEVITRA) 20 MG tablet Take 1  tablet (20 mg total) by mouth daily as needed for erectile dysfunction.  12 tablet  11  . vitamin B-12 (CYANOCOBALAMIN) 1000 MCG tablet Take 1,000 mcg by mouth daily.       No current facility-administered medications for this visit.    Allergies:   Mercury; Clopidogrel bisulfate; Contrast media; Iodine; Latex; and Sulfonamide derivatives   Social History:  The patient  reports that  he has never smoked. He has never used smokeless tobacco. He reports that he does not drink alcohol or use illicit drugs.   Family History:  The patient's family history includes Deep vein thrombosis in his brother, mother, and sister; Diabetes in his brother; Heart disease in his brother, brother, and father; Hypertension in his brother, brother, and mother.   ROS:  Please see the history of present illness.  All other systems reviewed and negative.   PHYSICAL EXAM:  VS:  BP 122/60  Pulse 64  Ht 5\' 7"  (1.702 m)  Wt 151 lb (68.493 kg)  BMI 23.64 kg/m2 Well nourished, well developed WM in no acute distress HEENT: normal Neck: no JVD Cardiac:  normal S1, S2; RRR; no murmur Lungs:  clear to auscultation bilaterally, no wheezing, rhonchi or rales Abd: soft, nontender, no hepatomegaly Ext: trace-1+ LLE edema, no RLE edema Skin: warm and dry Neuro:  moves all extremities spontaneously, no focal abnormalities noted    ASSESSMENT AND PLAN:   1. DOE 2. Acute on chronic systolic CHF/NICM - last known EF 28% 3. CKD stage III 4. CAD s/p CABG, PCI without recent chest pain 5. HTN, controlled 6. Hyperlipidemia, on statin, awaiting lipid result  His orthopnea and weight gain have improved with increased dose of Lasix and increased spironolactone. However, exertional DOE persists. He says by his scale he still has 2 lbs to go until baseline. He has not had any chest pain which is his usual anginal symptom. Per discussion with Dr. Katrinka Blazing, we will increase Lasix to 80mg  BID through the weekend (today, tomorrow,  Sunday) and reassess via phone on Monday. The patient was instructed to call the office on Monday to let us know how he is feeling. At that time Dr. Katrinka Blazing will consider the next step including plans for f/u bloodwork. Will recheck BNP and BMET today. He is scheduled for echo 03/18/14.  Dispo: As above. Will formally ask to schedule f/u Dr. Katrinka Blazing or APP 2 weeks.  Signed, Ronie Spies, PA-C  02/26/2014 11:07 AM

## 2014-02-26 NOTE — Progress Notes (Signed)
Ethan Vargas make sure we talk to Mr. Monna FamWinstead on Monday about weight and response to diuretic

## 2014-03-01 NOTE — Telephone Encounter (Signed)
Message copied by Jarvis NewcomerPARRIS-GODLEY, Jaquin Coy S on Mon Mar 01, 2014  2:13 PM ------      Message from: Laurann MontanaUNN, DAYNA N      Created: Sun Feb 28, 2014  2:03 PM       Renal function still stable and elevated consistent with prior values. pBNP still up, consistent with CHF (although can run high in setting of kidney disease). See office note - we had already instructed him to increase Lasix over the weekend.       The patient was already instructed to call the office on Monday with update of how he is feeling, at which time Dr. Katrinka BlazingSmith will determine the next plan of action.      Dayna Dunn PA-C       ------

## 2014-03-01 NOTE — Telephone Encounter (Signed)
Pt aware of lab results.Lipids are mostly good. LDL is right at goal. HDL a little low

## 2014-03-01 NOTE — Telephone Encounter (Signed)
Pt aware of lab results.Renal function still stable and elevated consistent with prior values. BNP still up.Lipids are mostly good. LDL is right at goal. HDL a little low. Pt sts that after diuretic increase over the weekend he is down 5lb. Pt sts that his le edema and sob has improved.adv pt update fwd to Dr.Smith and call back if he has any instruction. Pt agreeable and verbalized understanding.

## 2014-03-01 NOTE — Telephone Encounter (Signed)
Message copied by Jarvis NewcomerPARRIS-GODLEY, LISA S on Mon Mar 01, 2014  2:12 PM ------      Message from: Laurann MontanaUNN, DAYNA N      Created: Sun Feb 28, 2014  2:05 PM       Lipids are mostly good. LDL is right at goal. HDL a little low. Might consider increasing Crestor for improvement in HDL when he sees Dr. Katrinka BlazingSmith in f/u.      Dayna Dunn PA-C       ------

## 2014-03-01 NOTE — Telephone Encounter (Signed)
Message copied by Jarvis NewcomerPARRIS-GODLEY, LISA S on Mon Mar 01, 2014  1:58 PM ------      Message from: Laurann MontanaUNN, DAYNA N      Created: Sun Feb 28, 2014  2:05 PM       Lipids are mostly good. LDL is right at goal. HDL a little low. Might consider increasing Crestor for improvement in HDL when he sees Dr. Katrinka BlazingSmith in f/u.      Dayna Dunn PA-C       ------

## 2014-03-02 ENCOUNTER — Telehealth: Payer: Self-pay

## 2014-03-02 DIAGNOSIS — I5042 Chronic combined systolic (congestive) and diastolic (congestive) heart failure: Secondary | ICD-10-CM

## 2014-03-02 MED ORDER — POTASSIUM CHLORIDE ER 10 MEQ PO TBCR: 10.0000 meq | EXTENDED_RELEASE_TABLET | Freq: Every day | ORAL | Status: DC

## 2014-03-02 MED ORDER — FUROSEMIDE 80 MG PO TABS: 80.0000 mg | ORAL_TABLET | Freq: Two times a day (BID) | ORAL | Status: DC

## 2014-03-02 NOTE — Telephone Encounter (Signed)
pt aware of Dr.Smith instructions.Continue Furosemide 80 mg BID. Start Potassium 10 meq daily. BMET Thursday. Call if weak , > 10 weight loss(total), or dizzy.Rx sent to pt pharmacy lab appt sch for 10/8.pt verbalized understanding.

## 2014-03-02 NOTE — Telephone Encounter (Signed)
called to give pt Dr.Smith instructions.lmtcb 

## 2014-03-02 NOTE — Telephone Encounter (Signed)
Follow up      Returning Lisa's call.  He will be available until 4pm

## 2014-03-02 NOTE — Telephone Encounter (Signed)
Message copied by Jarvis NewcomerPARRIS-GODLEY, LISA S on Tue Mar 02, 2014 10:50 AM ------      Message from: Laurann MontanaUNN, DAYNA N      Created: Sun Feb 28, 2014  2:03 PM       Renal function still stable and elevated consistent with prior values. pBNP still up, consistent with CHF (although can run high in setting of kidney disease). See office note - we had already instructed him to increase Lasix over the weekend.       The patient was already instructed to call the office on Monday with update of how he is feeling, at which time Dr. Katrinka BlazingSmith will determine the next plan of action.      Dayna Dunn PA-C       ------

## 2014-03-04 ENCOUNTER — Other Ambulatory Visit (INDEPENDENT_AMBULATORY_CARE_PROVIDER_SITE_OTHER): Payer: 59 | Admitting: *Deleted

## 2014-03-04 DIAGNOSIS — I5042 Chronic combined systolic (congestive) and diastolic (congestive) heart failure: Secondary | ICD-10-CM

## 2014-03-04 LAB — BASIC METABOLIC PANEL
BUN: 34 mg/dL — ABNORMAL HIGH (ref 6–23)
CALCIUM: 9 mg/dL (ref 8.4–10.5)
CO2: 31 mEq/L (ref 19–32)
Chloride: 100 mEq/L (ref 96–112)
Creatinine, Ser: 1.8 mg/dL — ABNORMAL HIGH (ref 0.4–1.5)
GFR: 40.46 mL/min — AB (ref >60.00)
Glucose, Bld: 67 mg/dL — ABNORMAL LOW (ref 70–99)
Potassium: 3.8 mEq/L (ref 3.5–5.1)
SODIUM: 143 meq/L (ref 135–145)

## 2014-03-12 ENCOUNTER — Encounter: Payer: Self-pay | Admitting: Physician Assistant

## 2014-03-12 ENCOUNTER — Ambulatory Visit (INDEPENDENT_AMBULATORY_CARE_PROVIDER_SITE_OTHER): Payer: 59 | Admitting: Physician Assistant

## 2014-03-12 VITALS — BP 116/44 | HR 67 | Ht 67.0 in | Wt 142.8 lb

## 2014-03-12 DIAGNOSIS — I5023 Acute on chronic systolic (congestive) heart failure: Secondary | ICD-10-CM | POA: Insufficient documentation

## 2014-03-12 DIAGNOSIS — E785 Hyperlipidemia, unspecified: Secondary | ICD-10-CM

## 2014-03-12 DIAGNOSIS — I2510 Atherosclerotic heart disease of native coronary artery without angina pectoris: Secondary | ICD-10-CM

## 2014-03-12 DIAGNOSIS — I10 Essential (primary) hypertension: Secondary | ICD-10-CM

## 2014-03-12 DIAGNOSIS — N183 Chronic kidney disease, stage 3 unspecified: Secondary | ICD-10-CM

## 2014-03-12 DIAGNOSIS — I5042 Chronic combined systolic (congestive) and diastolic (congestive) heart failure: Secondary | ICD-10-CM

## 2014-03-12 LAB — BASIC METABOLIC PANEL
BUN: 39 mg/dL — ABNORMAL HIGH (ref 6–23)
CALCIUM: 9.5 mg/dL (ref 8.4–10.5)
CO2: 32 mEq/L (ref 19–32)
Chloride: 100 mEq/L (ref 96–112)
Creatinine, Ser: 1.8 mg/dL — ABNORMAL HIGH (ref 0.4–1.5)
GFR: 40.99 mL/min — AB (ref >60.00)
GLUCOSE: 130 mg/dL — AB (ref 70–99)
Potassium: 4.2 mEq/L (ref 3.5–5.1)
SODIUM: 140 meq/L (ref 135–145)

## 2014-03-12 LAB — BRAIN NATRIURETIC PEPTIDE: PRO B NATRI PEPTIDE: 570 pg/mL — AB (ref 0.0–100.0)

## 2014-03-12 MED ORDER — ISOSORBIDE MONONITRATE ER 30 MG PO TB24: 15.0000 mg | ORAL_TABLET | Freq: Every day | ORAL | Status: DC

## 2014-03-12 MED ORDER — FUROSEMIDE 80 MG PO TABS: ORAL_TABLET | ORAL | Status: DC

## 2014-03-12 NOTE — Progress Notes (Addendum)
[address] [place], [place]  [address] Phone: [phone]) [phone] Fax:  [phone]) [phone]  Date:  03/12/2014   Patient ID:  Ethan Vargas, DOB 1941-02-13, MRN [id number]   PCP:  Lupita RaiderSHAW,KIMBERLEE, MD  Cardiologist:  Dr. Katrinka BlazingSmith  History of Present Illness: Ethan Vargas is a 73 y.o. male with history of CAD s/p remote CABG 1998 with subsequent stenting as outlined below (last PCI in 2010), ICM, DM, HTN, HLD, CKD stage IIII, RAS s/p stent 2007, anemia of chronic disease who presents for follow-up. Last cath 2011:  CONCLUSION:  1. 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 The saphenous vein graft to the obtuse marginal is widely patent.  2. Widely patent LIMA to the LAD.  3. Severe native vessel coronary disease with total occlusion of the mid LAD, the circumflex, and the RCA.  4. Left ventricular dysfunction with inferoapical akinesis/aneurysm and estimated ejection fraction of 40-45%.  He was seen by Dr. Katrinka BlazingSmith in 06/2013 and was doing relatively well. He underwent stress nuclear perfusion imaging to re-evaluate perfusion/LV function in 07/2013: "Intermediate risk stress nuclear study. There is a medium size defect of moderate severity affecting the apical anterior, mid anterior and mid anteroseptal segments. (Fixed defect.) There is global hypokinesis. EF 28%." He has not previously tolerated BB therapy well due to feeling washed out but was re-challenged with Coreg earlier this year. The plan is for re-echo in October then reassessment for ICD candidacy. This is scheduled for 03/18/14.   I saw him on 02/17/14 for increased DOE, orthopnea, LEE, abdominal fullness, early satiety and weight gain. He has chronic L>R LE edema which was previously evaluated with negative LE duplex for DVT. He was not having  any chest pain. He had self-increased spironolactone from 12.5 to 25mg  to see if it would help but it did not. He was eating more salt than he should. CMET showed relatively stable Cr at 1.8, K 3.7, albumin 4.0, Hgb improved from prior, TSH nl and pBNP 1693. We increased Lasix to 80mg  QAM/40mg  QPM x 3 days then back to Lasix 80mg  daily. He saw initial improvement in symptoms with resolution of orthopnea, but DOE persisted. pBNP continued to be elevated and Cr was relatively stable. Per discussion with Dr. Katrinka BlazingSmith, we increased his Lasix further to a standing dose of 80mg  BID.  He is down to 142 lb in our office today. He feels much better but DOE has not completely resolved. His LEE is gone. He has a raspy cough/mild nasal congestion which he believes is related to allergies. No fever, chills, evidence of bleeding, nausea, vomiting, or syncope. As before, he has not had any chest pain. He just doesn't think he's felt like himself for the last 2 years. He sleeps on 2 regular pillows now.  Recent Labs: 02/17/2014: ALT 35; Hemoglobin 12.2*; TSH 2.56  02/26/2014: HDL Cholesterol by NMR 26.50*; LDL (calc) 70; Pro B Natriuretic peptide (BNP) 1547.0*  03/04/2014: Creatinine 1.8*; Potassium 3.8   Wt Readings from Last 3 Encounters:  03/12/14 142 lb 12.8 oz (64.774 kg)  02/26/14 151 lb (68.493 kg)  02/17/14 158 lb (71.668 kg)     Past Medical History  Diagnosis Date  . CAD (coronary artery disease)     a. Remote CABG 1998. b. Inf  MI 2003 s/ DES to SVG-RCA. c. Additional stenting in 2010 - occluded SVG to RCA and DES to SVG-Diag#1. d. Nuc 07/2013 with Intermediate risk stress nuclear study.  There is a medium size defect of moderate severity affecting the apical anterior, mid anterior and mid anteroseptal segments. There is global hypokinesis.  EF 28%.  . Sleep apnea   . Diabetes mellitus     a. c/b neuropathy, nephropathy.  . Ischemic cardiomyopathy   . Hypertension   . Anemia of chronic disease   .  Chronic systolic CHF (congestive heart failure)   . Peripheral vascular disease   . Cellulitis Dec./Jan 2011 and 2012  . CKD (chronic kidney disease), stage III   . Hyperlipidemia   . Barrett's esophageal ulceration   . Renal artery stenosis     a. s/p stent 2007.    Current Outpatient Prescriptions  Medication Sig Dispense Refill  . acetaminophen (TYLENOL) 325 MG tablet Take 325 mg by mouth 2 (two) times daily.      Marland Kitchen aspirin EC 81 MG tablet Take 81 mg by mouth daily.      Marland Kitchen azelastine (OPTIVAR) 0.05 % ophthalmic solution Place 1 drop into both eyes daily as needed. To affected eyes        . carvedilol (COREG) 12.5 MG tablet Take 1 tablet (12.5 mg total) by mouth 2 (two) times daily.  60 tablet  5  . cholecalciferol (VITAMIN D) 1000 UNITS tablet Take 1,000 Units by mouth daily.        . clobetasol ointment (TEMOVATE) 0.05 % Apply 1 application topically 2 (two) times daily as needed. To affected area; do not use TAZORAC       . CORDRAN 4 MCG/SQCM TAPE as needed. Apply to hands      . doxazosin (CARDURA) 4 MG tablet Take 4 mg by mouth daily.       . ferrous sulfate 325 (65 FE) MG tablet Take 325 mg by mouth daily.       . fluticasone (FLONASE) 50 MCG/ACT nasal spray Place 1 spray into both nostrils 2 (two) times daily.      . furosemide (LASIX) 80 MG tablet Take 1 tablet (80 mg total) by mouth 2 (two) times daily.  60 tablet  5  . latanoprost (XALATAN) 0.005 % ophthalmic solution Place 1 drop into both eyes at bedtime.      Marland Kitchen LEVEMIR FLEXTOUCH 100 UNIT/ML Pen as directed. TAKE 30 UNITS      . lisinopril (PRINIVIL,ZESTRIL) 20 MG tablet Take 20 mg by mouth daily.       . montelukast (SINGULAIR) 10 MG tablet Take 10 mg by mouth daily.       . nitroGLYCERIN (NITROSTAT) 0.4 MG SL tablet Place 1 tablet (0.4 mg total) under the tongue every 5 (five) minutes as needed. For chest pain  25 tablet  5  . PARoxetine HCl (PAXIL PO) Take by mouth. 1 tablet daily      . potassium chloride (K-DUR) 10  MEQ tablet Take 1 tablet (10 mEq total) by mouth daily.  30 tablet  5  . rosuvastatin (CRESTOR) 10 MG tablet Take 10 mg by mouth daily.        Marland Kitchen spironolactone (ALDACTONE) 25 MG tablet Take 1 tablet daily      . testosterone cypionate (DEPOTESTOTERONE CYPIONATE) 200 MG/ML injection every 21 ( twenty-one) days.      . vardenafil (LEVITRA) 20 MG tablet Take 1 tablet (20 mg total) by mouth daily  as needed for erectile dysfunction.  12 tablet  11  . vitamin B-12 (CYANOCOBALAMIN) 1000 MCG tablet Take 1,000 mcg by mouth daily.       No current facility-administered medications for this visit.    Allergies:   Contrast media; Iodine; Mercury; Clopidogrel bisulfate; Latex; and Sulfonamide derivatives   Social History:  The patient  reports that he has never smoked. He has never used smokeless tobacco. He reports that he does not drink alcohol or use illicit drugs.   Family History:  The patient's family history includes Deep vein thrombosis in his brother, mother, and sister; Diabetes in his brother; Heart disease in his brother, brother, and father; Hypertension in his brother, brother, and mother.   ROS:  Please see the history of present illness.      All other systems reviewed and negative.   PHYSICAL EXAM:  VS:  BP 116/44  Pulse 67  Ht 5\' 7"  (1.702 m)  Wt 142 lb 12.8 oz (64.774 kg)  BMI 22.36 kg/m2 Well nourished, well developed WM, in no acute distress, occasional raspy cough HEENT: normal Neck: no JVD Cardiac:  normal S1, S2; RRR; no murmur Lungs:  clear to auscultation bilaterally, no wheezing, rhonchi or rales Abd: soft, nontender, no hepatomegaly Ext: no LE edema - this has resolved Skin: warm and dry Neuro:  moves all extremities spontaneously, no focal abnormalities noted  ASSESSMENT AND PLAN:  1. Acute on chronic systolic CHF/NICM - last known EF 28% - he is much improved on increased diuretic and appears euvolemic. He does still note a component of DOE. However, weight is the  lowest it's been in years and I am worried him heading towards too dry. He seems to be tolerating the increased Lasix, but will check BMET and BNP today to reassess. Based on these results I might scale back the Lasix to 80mg  QAM/40mg  QPM. It is also possible that his DOE could represent an anginal equivalent but he is not having any CP similar to prior angina. I am not eager to recommend cath given his CKD and nonischemic nuc in 06/2013. When labs come back I will consider addition of Imdur. (My dose recommendation will be based on what we are doing with the Lasix given his BP today. It's a little lower than usual. If I do prescribe this we will need to warn him about interaction with Levitra.) A portion of his DOE may be due to deconditioning as his activity level has progressively decreased in the last 2 years, thus we discussed gradual increase of activity. His echo is scheduled for next week. 2. CKD stage III - repeat BMET today. 3. CAD s/p CABG, PCI without recent chest pain - see above. 4. HTN - controlled. 5. Hyperlipidemia - will defer statin titration to primary cardiologist.  Dispo: F/u Dr. Katrinka Blazing as scheduled in November.  Signed, Ronie Spies, PA-C  03/12/2014 12:10 PM   Addendum: BMET showed BUN 39/Cr 1.8. pBNP has dropped to 570. I do worry about him heading towards the "too dry" range given his low weight today. I have called him and asked him to drop Lasix down to a standing dose of 80mg  QAM and 40mg  QPM (still had CHF symptoms on 80mg  daily, now getting dried out on 80mg  BID). Will also start Imdur 15mg  daily (1/2 tablet of a 30mg ) to see if this helps with his dyspnea. I have informed him that he cannot take Levitra with this medication. He is OK with this. He read  back the plan to me and verbalized understanding. Will ask nursing to help arrange repeat BMET at time of his echo to make sure his trajectory is stable.  Markesia Crilly PA-C 6:27 PM

## 2014-03-12 NOTE — Patient Instructions (Signed)
Your physician recommends that you continue on your current medications as directed. Please refer to the Current Medication list given to you today.   Your physician recommends that you return for lab work TODAY BMET AND BNP     KEEP FOLLOW UP APPOINTMENTS AS SCHEDULED WITH DR Katrinka BlazingSMITH

## 2014-03-12 NOTE — Addendum Note (Signed)
Addended by: Laurann MontanaUNN, Tarea Skillman N on: 03/12/2014 06:28 PM   Modules accepted: Orders, Medications

## 2014-03-16 ENCOUNTER — Other Ambulatory Visit (HOSPITAL_COMMUNITY): Payer: Medicare HMO

## 2014-03-17 ENCOUNTER — Other Ambulatory Visit: Payer: Self-pay | Admitting: Physician Assistant

## 2014-03-17 DIAGNOSIS — I5022 Chronic systolic (congestive) heart failure: Secondary | ICD-10-CM

## 2014-03-18 ENCOUNTER — Other Ambulatory Visit (INDEPENDENT_AMBULATORY_CARE_PROVIDER_SITE_OTHER): Payer: 59 | Admitting: *Deleted

## 2014-03-18 ENCOUNTER — Ambulatory Visit (HOSPITAL_COMMUNITY): Payer: 59 | Attending: Internal Medicine | Admitting: Radiology

## 2014-03-18 DIAGNOSIS — E785 Hyperlipidemia, unspecified: Secondary | ICD-10-CM | POA: Insufficient documentation

## 2014-03-18 DIAGNOSIS — E119 Type 2 diabetes mellitus without complications: Secondary | ICD-10-CM | POA: Insufficient documentation

## 2014-03-18 DIAGNOSIS — I5042 Chronic combined systolic (congestive) and diastolic (congestive) heart failure: Secondary | ICD-10-CM | POA: Insufficient documentation

## 2014-03-18 DIAGNOSIS — I10 Essential (primary) hypertension: Secondary | ICD-10-CM | POA: Insufficient documentation

## 2014-03-18 DIAGNOSIS — I5022 Chronic systolic (congestive) heart failure: Secondary | ICD-10-CM

## 2014-03-18 DIAGNOSIS — I5023 Acute on chronic systolic (congestive) heart failure: Secondary | ICD-10-CM

## 2014-03-18 DIAGNOSIS — I2510 Atherosclerotic heart disease of native coronary artery without angina pectoris: Secondary | ICD-10-CM

## 2014-03-18 LAB — BASIC METABOLIC PANEL
BUN: 38 mg/dL — ABNORMAL HIGH (ref 6–23)
CHLORIDE: 103 meq/L (ref 96–112)
CO2: 27 mEq/L (ref 19–32)
CREATININE: 2 mg/dL — AB (ref 0.4–1.5)
Calcium: 9.5 mg/dL (ref 8.4–10.5)
GFR: 36.18 mL/min — ABNORMAL LOW (ref >60.00)
Glucose, Bld: 137 mg/dL — ABNORMAL HIGH (ref 70–99)
POTASSIUM: 4.9 meq/L (ref 3.5–5.1)
SODIUM: 141 meq/L (ref 135–145)

## 2014-03-18 NOTE — Progress Notes (Signed)
Echocardiogram performed.  

## 2014-03-22 ENCOUNTER — Ambulatory Visit
Admission: RE | Admit: 2014-03-22 | Discharge: 2014-03-22 | Disposition: A | Discharge status: Home / Self Care | Payer: 59 | Source: Ambulatory Visit | Attending: Family Medicine | Admitting: Family Medicine

## 2014-03-22 ENCOUNTER — Other Ambulatory Visit: Payer: Self-pay | Admitting: Family Medicine

## 2014-03-22 DIAGNOSIS — R059 Cough, unspecified: Secondary | ICD-10-CM

## 2014-03-22 DIAGNOSIS — R05 Cough: Secondary | ICD-10-CM

## 2014-03-30 ENCOUNTER — Telehealth: Payer: Self-pay

## 2014-03-30 DIAGNOSIS — I5042 Chronic combined systolic (congestive) and diastolic (congestive) heart failure: Secondary | ICD-10-CM

## 2014-03-30 NOTE — Telephone Encounter (Signed)
-----   Message from Henry W Smith III, MD sent at 03/29/2014  8:36 AM EST ----- Labs are okay on 80 mg/40 mg of furosemide per day. How does he feel? Has weight increased? Does he have  Angina? 

## 2014-03-30 NOTE — Telephone Encounter (Signed)
Pt aware of lab results. Labs are okay on 80 mg/40 mg of furosemide per day. How does he feel? Has weight increased? Does he have Angina? Pt sts that his weigh has steadily decreased Pt sts that his baseline weights on his home scale is 145-148lb Pt weight this morning was 135lb Pt denies chest pain, swelling, sob Pt is tolerating Isosorbide ok Pt wants to know if he can cut back on some of his diuretic medications Pt adv that I will fwd Dr.Smith an update and call back with his recommendation Pt agreeable and verbalized understanding.

## 2014-03-30 NOTE — Telephone Encounter (Signed)
-----   Message from Lesleigh NoeHenry W Smith III, MD sent at 03/29/2014  8:36 AM EST ----- Labs are okay on 80 mg/40 mg of furosemide per day. How does he feel? Has weight increased? Does he have  Angina?

## 2014-03-31 ENCOUNTER — Encounter: Payer: Self-pay | Admitting: Interventional Cardiology

## 2014-03-31 DIAGNOSIS — I50810 Right heart failure, unspecified: Secondary | ICD-10-CM | POA: Insufficient documentation

## 2014-03-31 DIAGNOSIS — I2729 Other secondary pulmonary hypertension: Secondary | ICD-10-CM | POA: Insufficient documentation

## 2014-04-01 MED ORDER — FUROSEMIDE 80 MG PO TABS: 80.0000 mg | ORAL_TABLET | Freq: Every day | ORAL | Status: DC

## 2014-04-01 NOTE — Telephone Encounter (Signed)
called to give pt lab and echo results. lmtcb

## 2014-04-01 NOTE — Telephone Encounter (Signed)
-----   Message from Henry W Smith III, MD sent at 03/31/2014  1:27 PM EST ----- Go to 80 mg daily of furosemide, but if starts to swell or shortness of breath returns even mildly, needs to call immediately so as to further increase diuretic to prevent recurrent CHF. 

## 2014-04-01 NOTE — Telephone Encounter (Signed)
Pt aware of lab .Go to 80 mg daily of furosemide, but if starts to swell or shortness of breath returns even mildly, needs to call immediately so as to further increase diuretic to prevent recurrent CHF.pt verbalized understanding.

## 2014-04-01 NOTE — Telephone Encounter (Signed)
-----   Message from [name] III, MD sent at 03/31/2014  1:27 PM EST ----- Go to 80 mg daily of furosemide, but if starts to swell or shortness of breath returns even mildly, needs to call immediately so as to further increase diuretic to prevent recurrent CHF.

## 2014-04-20 ENCOUNTER — Telehealth: Payer: Self-pay | Admitting: Interventional Cardiology

## 2014-04-20 DIAGNOSIS — G4733 Obstructive sleep apnea (adult) (pediatric): Secondary | ICD-10-CM | POA: Insufficient documentation

## 2014-04-20 NOTE — Telephone Encounter (Signed)
Follow Up ° ° ° ° ° ° ° °Pt returning phone call. Please call pt back and advise. °

## 2014-04-20 NOTE — Telephone Encounter (Signed)
-----   Message from [name] III, MD sent at 03/31/2014  2:12 PM EST ----- New finding is right heart failure probably related to Sleep apnea and Left heart failure. So we treat sleep apnea with CPAP and Left heart failure with meds.

## 2014-04-20 NOTE — Telephone Encounter (Signed)
returned pt call. lmtcb 

## 2014-04-20 NOTE — Telephone Encounter (Signed)
Pt sts that he had his sleep study done with [facility] and f/u with Dr.Turner. Adv him that we will have him sign a release of info when he comes in for his office visit on 11/25 with Dr.Smith.and we will rqst sleep study/cardiac info. Pt will discuss possible cpap treatment at o/v with Dr.Smith

## 2014-04-20 NOTE — Telephone Encounter (Signed)
New Msg  Patient returning call. Please contact at [phone].

## 2014-04-20 NOTE — Telephone Encounter (Signed)
lmtcb

## 2014-04-21 ENCOUNTER — Encounter: Payer: Self-pay | Admitting: Interventional Cardiology

## 2014-04-21 ENCOUNTER — Ambulatory Visit (INDEPENDENT_AMBULATORY_CARE_PROVIDER_SITE_OTHER): Payer: 59 | Admitting: Interventional Cardiology

## 2014-04-21 ENCOUNTER — Telehealth: Payer: Self-pay | Admitting: Interventional Cardiology

## 2014-04-21 VITALS — BP 132/66 | HR 49 | Ht 67.0 in | Wt 144.4 lb

## 2014-04-21 DIAGNOSIS — I2510 Atherosclerotic heart disease of native coronary artery without angina pectoris: Secondary | ICD-10-CM

## 2014-04-21 DIAGNOSIS — N183 Chronic kidney disease, stage 3 unspecified: Secondary | ICD-10-CM

## 2014-04-21 DIAGNOSIS — I509 Heart failure, unspecified: Secondary | ICD-10-CM

## 2014-04-21 DIAGNOSIS — I2729 Other secondary pulmonary hypertension: Secondary | ICD-10-CM

## 2014-04-21 DIAGNOSIS — G4733 Obstructive sleep apnea (adult) (pediatric): Secondary | ICD-10-CM

## 2014-04-21 DIAGNOSIS — I5042 Chronic combined systolic (congestive) and diastolic (congestive) heart failure: Secondary | ICD-10-CM

## 2014-04-21 DIAGNOSIS — I272 Other secondary pulmonary hypertension: Secondary | ICD-10-CM

## 2014-04-21 DIAGNOSIS — I50810 Right heart failure, unspecified: Secondary | ICD-10-CM

## 2014-04-21 LAB — BASIC METABOLIC PANEL
BUN: 53 mg/dL — AB (ref 6–23)
CHLORIDE: 100 meq/L (ref 96–112)
CO2: 27 meq/L (ref 19–32)
Calcium: 9 mg/dL (ref 8.4–10.5)
Creatinine, Ser: 2 mg/dL — ABNORMAL HIGH (ref 0.4–1.5)
GFR: 35.75 mL/min — ABNORMAL LOW (ref >60.00)
GLUCOSE: 220 mg/dL — AB (ref 70–99)
POTASSIUM: 4.4 meq/L (ref 3.5–5.1)
SODIUM: 135 meq/L (ref 135–145)

## 2014-04-21 NOTE — Progress Notes (Signed)
Patient ID: Ethan Vargas Barsamian, male   DOB: 09-04-1940, 73 y.o.   MRN: [id number]    [address] [place], [place]  [address] Phone: [phone] Fax:  [phone]  Date:  04/21/2014   ID:  Ethan Vargas, DOB 09-04-1940, MRN [id number]  PCP:  Lupita RaiderSHAW,KIMBERLEE, MD   ASSESSMENT:  1. Untreated sleep apnea now with significant pulmonary hypertension and RV enlargement 2. Combined systolic and diastolic heart failure, LVEF 40% by recent echo 3. Chronic kidney disease, stage III-IV 4. Coronary artery disease without angina 5. Pulmonary hypertension, mixed etiology but with untreated sleep apnea contributing significantly  PLAN:  1. Basic metabolic panel today 2. Clinical follow-up in 2-3 months 3. Return to Dr. Mayford Knifeurner to get alternative management strategies for obstructive sleep apnea which is contributing to right heart failure and pulmonary hypertension 4. Call if clinical problems prior to next scheduled office visit.   SUBJECTIVE: Ethan Vargas Kohles is a 73 y.o. male who is doing relatively well. He has undergone significant diuresis and lower extremity edema is much improved. He still feels fatigued all the time. He falls asleep easily. He has sleep apnea but is not wearing his device. The reason is that he feels claustrophobic. He denies orthopnea. No angina. No episodes of syncope or orthostatic dizziness.   Wt Readings from Last 3 Encounters:  04/21/14 144 lb 6.4 oz (65.499 kg)  03/12/14 142 lb 12.8 oz (64.774 kg)  02/26/14 151 lb (68.493 kg)     Past Medical History  Diagnosis Date  . CAD (coronary artery disease)     a. Remote CABG 1998. b. Inf MI 2003 s/ DES to SVG-RCA. c. Additional stenting in 2010 - occluded SVG to RCA and DES to SVG-Diag#1. d. Nuc 07/2013 with Intermediate risk stress nuclear study.  There is a medium size defect of moderate severity affecting the apical anterior, mid anterior and mid anteroseptal segments. There is global hypokinesis.   EF 28%.  . Sleep apnea   . Diabetes mellitus     a. c/b neuropathy, nephropathy.  . Ischemic cardiomyopathy   . Hypertension   . Anemia of chronic disease   . Chronic systolic CHF (congestive heart failure)   . Peripheral vascular disease   . Cellulitis Dec./Jan 2011 and 2012  . CKD (chronic kidney disease), stage III   . Hyperlipidemia   . Barrett's esophageal ulceration   . Renal artery stenosis     a. s/p stent 2007.    Current Outpatient Prescriptions  Medication Sig Dispense Refill  . acetaminophen (TYLENOL) 325 MG tablet Take 325 mg by mouth 2 (two) times daily.    Marland Kitchen. aspirin EC 81 MG tablet Take 81 mg by mouth daily.    Marland Kitchen. azelastine (OPTIVAR) 0.05 % ophthalmic solution Place 1 drop into both eyes daily as needed. To affected eyes      . carvedilol (COREG) 12.5 MG tablet Take 1 tablet (12.5 mg total) by mouth 2 (two) times daily. 60 tablet 5  . cholecalciferol (VITAMIN D) 1000 UNITS tablet Take 1,000 Units by mouth daily.      . clobetasol ointment (TEMOVATE) 0.05 % Apply 1 application topically 2 (two) times daily as needed. To affected area; do not use TAZORAC     . CORDRAN 4 MCG/SQCM TAPE as needed. Apply to hands    . doxazosin (CARDURA) 4 MG tablet Take 4 mg by mouth daily.     . ferrous sulfate 325 (65 FE) MG tablet Take 325  mg by mouth daily.     . fluticasone (FLONASE) 50 MCG/ACT nasal spray Place 1 spray into both nostrils 2 (two) times daily.    . furosemide (LASIX) 80 MG tablet Take 1 tablet (80 mg total) by mouth daily. (Patient taking differently: Take 80 mg by mouth daily. 1 1/2 TAB PER DAY)  5  . isosorbide mononitrate (IMDUR) 30 MG 24 hr tablet Take 0.5 tablets (15 mg total) by mouth daily. 30 tablet 2  . latanoprost (XALATAN) 0.005 % ophthalmic solution Place 1 drop into both eyes at bedtime.    Marland Kitchen. LEVEMIR FLEXTOUCH 100 UNIT/ML Pen as directed. TAKE 30 UNITS    . lisinopril (PRINIVIL,ZESTRIL) 20 MG tablet Take 20 mg by mouth daily.     . montelukast (SINGULAIR)  10 MG tablet Take 10 mg by mouth daily.     . nitroGLYCERIN (NITROSTAT) 0.4 MG SL tablet Place 1 tablet (0.4 mg total) under the tongue every 5 (five) minutes as needed. For chest pain 25 tablet 5  . PARoxetine HCl (PAXIL PO) Take 20 mg by mouth daily. 1 tablet daily    . potassium chloride (K-DUR) 10 MEQ tablet Take 1 tablet (10 mEq total) by mouth daily. 30 tablet 5  . rosuvastatin (CRESTOR) 10 MG tablet Take 10 mg by mouth daily.      Marland Kitchen. spironolactone (ALDACTONE) 25 MG tablet Take 1 tablet daily    . testosterone cypionate (DEPOTESTOTERONE CYPIONATE) 200 MG/ML injection every 21 ( twenty-one) days.    . TRADJENTA 5 MG TABS tablet Take 5 mg by mouth daily.    . vitamin B-12 (CYANOCOBALAMIN) 1000 MCG tablet Take 1,000 mcg by mouth daily.     No current facility-administered medications for this visit.    Allergies:    Allergies  Allergen Reactions  . Contrast Media [Iodinated Diagnostic Agents] Swelling    Premedication with prednisone  . Iodine Swelling  . Mercury Swelling  . Clopidogrel Bisulfate     REACTION: rash  . Latex     Oral nausea  . Sulfonamide Derivatives     unknown    Social History:  The patient  reports that he has never smoked. He has never used smokeless tobacco. He reports that he does not drink alcohol or use illicit drugs.   ROS:  Please see the history of present illness.   Poor appetite but stable weight   All other systems reviewed and negative.   OBJECTIVE: VS:  BP 132/66 mmHg  Pulse 49  Ht 5\' 7"  (1.702 m)  Wt 144 lb 6.4 oz (65.499 kg)  BMI 22.61 kg/m2  SpO2 98% Well nourished, well developed, in no acute distress, slender with good skin color HEENT: normal Neck: JVD flat with patient sitting at 90. Carotid bruit absent  Cardiac:  normal S1, S2; RRR; no murmur Lungs:  clear to auscultation bilaterally, no wheezing, rhonchi or rales Abd: soft, nontender, no hepatomegaly Ext: Edema trace bilateral. Pulses 1-2+ bilateral Skin: warm and  dry Neuro:  CNs 2-12 intact, no focal abnormalities noted  EKG:  Not performed       Signed, Darci NeedleHenry Vargas. B. Lawan Nanez III, MD 04/21/2014 9:58 AM

## 2014-04-21 NOTE — Patient Instructions (Signed)
Your physician recommends that you schedule a follow-up appointment with Dr. Mayford Knifeurner for sleep apnea (pt has seen Dr. Mayford Knifeurner in the past)  Your physician recommends that you schedule a follow-up appointment in:  2-3 months with Dr. Katrinka BlazingSmith.  Lab work to be done today--BMP

## 2014-04-21 NOTE — Telephone Encounter (Signed)
[facility]

## 2014-04-26 ENCOUNTER — Telehealth: Payer: Self-pay

## 2014-04-26 DIAGNOSIS — I5042 Chronic combined systolic (congestive) and diastolic (congestive) heart failure: Secondary | ICD-10-CM

## 2014-04-26 NOTE — Telephone Encounter (Signed)
called to give pt lab results and Dr.Smith recommendations.lmtcb 

## 2014-04-26 NOTE — Telephone Encounter (Signed)
-----   Message from Henry W Smith III, MD sent at 04/21/2014  4:43 PM EST ----- Labs suggest we should decrease the furosemide to 80 mg daily. Weight daily. Monitor for LE edema. Call if either increase significantly. 

## 2014-04-28 MED ORDER — FUROSEMIDE 80 MG PO TABS: 80.0000 mg | ORAL_TABLET | Freq: Every day | ORAL | Status: DC

## 2014-04-28 NOTE — Telephone Encounter (Signed)
Left message with pt secretary for pt to call the office

## 2014-04-28 NOTE — Telephone Encounter (Signed)
Pt aware of lab results and Dr.Smith's recommendations.  Labs suggest we should decrease the furosemide to 80 mg daily. Weight daily. Monitor for LE edema. Call if either increase significantly.  Pt verbalized understanding.

## 2014-04-28 NOTE — Telephone Encounter (Signed)
-----   Message from [name] III, MD sent at 04/21/2014  4:43 PM EST ----- Labs suggest we should decrease the furosemide to 80 mg daily. Weight daily. Monitor for LE edema. Call if either increase significantly.

## 2014-04-28 NOTE — Telephone Encounter (Signed)
-----   Message from Henry W Smith III, MD sent at 04/21/2014  4:43 PM EST ----- Labs suggest we should decrease the furosemide to 80 mg daily. Weight daily. Monitor for LE edema. Call if either increase significantly. 

## 2014-05-05 ENCOUNTER — Other Ambulatory Visit: Payer: Self-pay | Admitting: Interventional Cardiology

## 2014-05-12 ENCOUNTER — Encounter: Payer: Self-pay | Admitting: Cardiology

## 2014-05-12 ENCOUNTER — Ambulatory Visit (INDEPENDENT_AMBULATORY_CARE_PROVIDER_SITE_OTHER): Payer: 59 | Admitting: Cardiology

## 2014-05-12 ENCOUNTER — Other Ambulatory Visit: Payer: Self-pay

## 2014-05-12 VITALS — BP 130/48 | HR 53 | Ht 67.0 in | Wt 146.2 lb

## 2014-05-12 DIAGNOSIS — I10 Essential (primary) hypertension: Secondary | ICD-10-CM

## 2014-05-12 DIAGNOSIS — I2729 Other secondary pulmonary hypertension: Secondary | ICD-10-CM

## 2014-05-12 DIAGNOSIS — I50810 Right heart failure, unspecified: Secondary | ICD-10-CM

## 2014-05-12 DIAGNOSIS — I272 Other secondary pulmonary hypertension: Secondary | ICD-10-CM

## 2014-05-12 DIAGNOSIS — I509 Heart failure, unspecified: Secondary | ICD-10-CM

## 2014-05-12 DIAGNOSIS — I5042 Chronic combined systolic (congestive) and diastolic (congestive) heart failure: Secondary | ICD-10-CM

## 2014-05-12 DIAGNOSIS — G4733 Obstructive sleep apnea (adult) (pediatric): Secondary | ICD-10-CM

## 2014-05-12 NOTE — Progress Notes (Signed)
[address] [place], [place]  [address] Phone: [phone] Fax:  [phone]  Date:  05/12/2014   ID:  Thelma Comprthur W Wheatley, DOB November 01, 1940, MRN [id number]  PCP:  Lupita RaiderSHAW,KIMBERLEE, MD  Sleep Medicine:  Armanda Magicraci Marquest Gunkel, MD  Cardiologist:  Verdis PrimeHenry Smith, MD   History of Present Illness:  Ethan Vargas is a 73 y.o. male with a history of OSA, not on CPAP, combined systolic and diastolic CHF and now with 2D echo showing pulmonary HTN with right heart failure.  He is referred by Dr. Katrinka BlazingSmith for further evaluation.  He was on CPAP back several years ago but then got PNA and was hospitalized with cellulitis and stopped using the CPAP and never got back on it.  Now he has evidence of pulmonary HTN on echo and is referred for further evaluation.  He does snore but he says that his wife has commented in the past that he would stop breathing in his sleep.  He feels tired when he gets up in the am and has excessive daytime sleepiness.     Wt Readings from Last 3 Encounters:  05/12/14 146 lb 3.2 oz (66.316 kg)  04/21/14 144 lb 6.4 oz (65.499 kg)  03/12/14 142 lb 12.8 oz (64.774 kg)     Past Medical History  Diagnosis Date  . CAD (coronary artery disease)     a. Remote CABG 1998. b. Inf MI 2003 s/ DES to SVG-RCA. c. Additional stenting in 2010 - occluded SVG to RCA and DES to SVG-Diag#1. d. Nuc 07/2013 with Intermediate risk stress nuclear study.  There is a medium size defect of moderate severity affecting the apical anterior, mid anterior and mid anteroseptal segments. There is global hypokinesis.  EF 28%.  . Sleep apnea   . Diabetes mellitus     a. c/b neuropathy, nephropathy.  . Ischemic cardiomyopathy   . Hypertension   . Anemia of chronic disease   . Chronic systolic CHF (congestive heart failure)   . Peripheral vascular disease   . Cellulitis Dec./Jan 2011 and 2012  . CKD (chronic kidney disease), stage III   . Hyperlipidemia   . Barrett's esophageal ulceration   . Renal artery  stenosis     a. s/p stent 2007.    Current Outpatient Prescriptions  Medication Sig Dispense Refill  . acetaminophen (TYLENOL) 325 MG tablet Take 325 mg by mouth 2 (two) times daily.    Marland Kitchen. aspirin EC 81 MG tablet Take 81 mg by mouth daily.    Marland Kitchen. azelastine (OPTIVAR) 0.05 % ophthalmic solution Place 1 drop into both eyes daily as needed. To affected eyes      . carvedilol (COREG) 12.5 MG tablet TAKE 1 TABLET TWICE DAILY. 60 tablet 3  . cholecalciferol (VITAMIN D) 1000 UNITS tablet Take 1,000 Units by mouth daily.      . clobetasol cream (TEMOVATE) 0.05 %     . CORDRAN 4 MCG/SQCM TAPE as needed. Apply to hands    . doxazosin (CARDURA) 4 MG tablet Take 4 mg by mouth daily.     . ferrous sulfate 325 (65 FE) MG tablet Take 325 mg by mouth daily.     . fluticasone (FLONASE) 50 MCG/ACT nasal spray Place 1 spray into both nostrils 2 (two) times daily.    . furosemide (LASIX) 80 MG tablet Take 1 tablet (80 mg total) by mouth daily.    . isosorbide mononitrate (IMDUR) 30 MG 24 hr tablet Take 0.5 tablets (15 mg total)  by mouth daily. 30 tablet 2  . latanoprost (XALATAN) 0.005 % ophthalmic solution Place 1 drop into both eyes at bedtime.    Marland Kitchen. LEVEMIR FLEXTOUCH 100 UNIT/ML Pen as directed. TAKE 30 UNITS    . lisinopril (PRINIVIL,ZESTRIL) 20 MG tablet Take 20 mg by mouth daily.     . montelukast (SINGULAIR) 10 MG tablet Take 10 mg by mouth daily.     Marland Kitchen. NITROSTAT 0.4 MG SL tablet 1 TABLET UNDER TONGUE AS NEEDED. MAY RPT. EVERY 5 MIN. UP TO 3 TIMES.CALL 911 IF NOT FULLY RELIEVED 25 tablet 1  . PARoxetine (PAXIL) 20 MG tablet     . potassium chloride (K-DUR) 10 MEQ tablet Take 1 tablet (10 mEq total) by mouth daily. 30 tablet 5  . rosuvastatin (CRESTOR) 10 MG tablet Take 10 mg by mouth daily.      Marland Kitchen. spironolactone (ALDACTONE) 25 MG tablet Take 1 tablet daily    . testosterone cypionate (DEPOTESTOTERONE CYPIONATE) 200 MG/ML injection every 21 ( twenty-one) days.    . TRADJENTA 5 MG TABS tablet Take 5 mg by  mouth daily.    Marland Kitchen. trimethoprim-polymyxin b (POLYTRIM) ophthalmic solution   0  . vitamin B-12 (CYANOCOBALAMIN) 1000 MCG tablet Take 1,000 mcg by mouth daily.     No current facility-administered medications for this visit.    Allergies:    Allergies  Allergen Reactions  . Contrast Media [Iodinated Diagnostic Agents] Swelling    Premedication with prednisone  . Iodine Swelling  . Mercury Swelling  . Clopidogrel Bisulfate     REACTION: rash  . Latex     Oral nausea  . Sulfonamide Derivatives     unknown    Social History:  The patient  reports that he has never smoked. He has never used smokeless tobacco. He reports that he does not drink alcohol or use illicit drugs.   Family History:  The patient's family history includes Deep vein thrombosis in his brother, mother, and sister; Diabetes in his brother; Heart disease in his brother, brother, and father; Hypertension in his brother, brother, and mother.   ROS:  Please see the history of present illness.      All other systems reviewed and negative.   PHYSICAL EXAM: VS:  BP 130/48 mmHg  Pulse 53  Ht 5\' 7"  (1.702 m)  Wt 146 lb 3.2 oz (66.316 kg)  BMI 22.89 kg/m2  SpO2 99% Well nourished, well developed, in no acute distress HEENT: normal Neck: no JVD Cardiac:  normal S1, S2; RRR; no murmur Lungs:  clear to auscultation bilaterally, no wheezing, rhonchi or rales Abd: soft, nontender, no hepatomegaly Ext: no edema Skin: warm and dry Neuro:  CNs 2-12 intact, no focal abnormalities noted      ASSESSMENT AND PLAN:  1. OSA currently not on CPAP.  He had tolerated the CPAP in the past but after his PNA he did not go back on it.  I will set him up for a split night sleep study given his excessive daytime sleepiness along with pulmonary HTN on echo 2. Pulmonary HTN by echo 3. Chronic combined systolic/diastolic CHF - appears compensated 4. HTN well controlled  Followup with me PRN pending results of sleep  study  Signed, Armanda Magicraci Aima Mcwhirt, MD [facility] 05/12/2014 8:40 AM

## 2014-05-12 NOTE — Patient Instructions (Signed)
Your physician has recommended that you have a sleep study. This test records several body functions during sleep, including: brain activity, eye movement, oxygen and carbon dioxide blood levels, heart rate and rhythm, breathing rate and rhythm, the flow of air through your mouth and nose, snoring, body muscle movements, and chest and belly movement.  Your physician recommends that you schedule a follow-up appointment as needed.  

## 2014-06-01 ENCOUNTER — Telehealth: Payer: Self-pay | Admitting: Interventional Cardiology

## 2014-06-01 NOTE — Telephone Encounter (Signed)
[facility] gave to chart prep team

## 2014-06-29 ENCOUNTER — Ambulatory Visit (INDEPENDENT_AMBULATORY_CARE_PROVIDER_SITE_OTHER): Payer: Managed Care, Other (non HMO) | Admitting: Interventional Cardiology

## 2014-06-29 ENCOUNTER — Encounter: Payer: Self-pay | Admitting: Interventional Cardiology

## 2014-06-29 VITALS — BP 138/60 | HR 60 | Ht 67.0 in | Wt 150.1 lb

## 2014-06-29 DIAGNOSIS — I5042 Chronic combined systolic (congestive) and diastolic (congestive) heart failure: Secondary | ICD-10-CM

## 2014-06-29 DIAGNOSIS — G4733 Obstructive sleep apnea (adult) (pediatric): Secondary | ICD-10-CM

## 2014-06-29 DIAGNOSIS — I2510 Atherosclerotic heart disease of native coronary artery without angina pectoris: Secondary | ICD-10-CM

## 2014-06-29 DIAGNOSIS — I10 Essential (primary) hypertension: Secondary | ICD-10-CM

## 2014-06-29 LAB — BASIC METABOLIC PANEL
BUN: 32 mg/dL — AB (ref 6–23)
CO2: 30 mEq/L (ref 19–32)
Calcium: 9.1 mg/dL (ref 8.4–10.5)
Chloride: 104 mEq/L (ref 96–112)
Creatinine, Ser: 1.77 mg/dL — ABNORMAL HIGH (ref 0.40–1.50)
GFR: 40.43 mL/min — ABNORMAL LOW (ref >60.00)
Glucose, Bld: 212 mg/dL — ABNORMAL HIGH (ref 70–99)
Potassium: 4.8 mEq/L (ref 3.5–5.1)
SODIUM: 137 meq/L (ref 135–145)

## 2014-06-29 MED ORDER — SPIRONOLACTONE 25 MG PO TABS: ORAL_TABLET | ORAL | Status: DC

## 2014-06-29 MED ORDER — FUROSEMIDE 80 MG PO TABS: 80.0000 mg | ORAL_TABLET | Freq: Every day | ORAL | Status: DC

## 2014-06-29 NOTE — Progress Notes (Signed)
Patient ID: Ethan Vargas, male   DOB: 08-May-1941, 74 y.o.   MRN: [id number]    Cardiology Office Note   Date:  06/29/2014   ID:  Ethan Vargas, DOB 08-May-1941, MRN [id number]  PCP:  Lupita RaiderSHAW,KIMBERLEE, MD  Cardiologist:   Lesleigh NoeSMITH III,Gwenyth Dingee W, MD   No chief complaint on file.     History of Present Illness: Ethan Vargas is a 74 y.o. male who presents for evaluation of chronic systolic and diastolic heart failure. Most recent LVEF was 40-45%. He therefore did not require consideration of AICD. His been some confusion about his diuretic regimen. He should be on furosemide 80 mg per day. The most recent prescription was 80 mg, one half tablet per day. He denies dyspnea. He has occasional fleeting chest discomfort but does not last longer than one or 2 minutes. He has not had syncope or palpitations. There is slightly more lower extremity edema now than previously.    Past Medical History  Diagnosis Date  . CAD (coronary artery disease)     a. Remote CABG 1998. b. Inf MI 2003 s/ DES to SVG-RCA. c. Additional stenting in 2010 - occluded SVG to RCA and DES to SVG-Diag#1. d. Nuc 07/2013 with Intermediate risk stress nuclear study.  There is a medium size defect of moderate severity affecting the apical anterior, mid anterior and mid anteroseptal segments. There is global hypokinesis.  EF 28%.  . Sleep apnea   . Diabetes mellitus     a. c/b neuropathy, nephropathy.  . Ischemic cardiomyopathy   . Hypertension   . Anemia of chronic disease   . Chronic systolic CHF (congestive heart failure)   . Peripheral vascular disease   . Cellulitis Dec./Jan 2011 and 2012  . CKD (chronic kidney disease), stage III   . Hyperlipidemia   . Barrett's esophageal ulceration   . Renal artery stenosis     a. s/p stent 2007.    Past Surgical History  Procedure Laterality Date  . Coronary artery bypass graft  Sept. 1998  . Arterial thrombectomy  2003    X's 2 stents  . Stents surgery  2007  and  2010      [facility]  Heart  . Knee surgery  1961    Right knee     Current Outpatient Prescriptions  Medication Sig Dispense Refill  . acetaminophen (TYLENOL) 325 MG tablet Take 325 mg by mouth 2 (two) times daily.    Marland Kitchen. aspirin EC 81 MG tablet Take 81 mg by mouth daily.    Marland Kitchen. azelastine (OPTIVAR) 0.05 % ophthalmic solution Place 1 drop into both eyes daily as needed. To affected eyes      . carvedilol (COREG) 12.5 MG tablet TAKE 1 TABLET TWICE DAILY. 60 tablet 3  . cholecalciferol (VITAMIN D) 1000 UNITS tablet Take 1,000 Units by mouth daily.      . clobetasol cream (TEMOVATE) 0.05 %     . CORDRAN 4 MCG/SQCM TAPE as needed. Apply to hands    . doxazosin (CARDURA) 4 MG tablet Take 4 mg by mouth daily.     . ferrous sulfate 325 (65 FE) MG tablet Take 325 mg by mouth daily.     . fluticasone (FLONASE) 50 MCG/ACT nasal spray Place 1 spray into both nostrils 2 (two) times daily.    . furosemide (LASIX) 80 MG tablet Take 1 tablet (80 mg total) by mouth daily. 30 tablet 6  . isosorbide mononitrate (IMDUR) 30 MG 24 hr tablet  Take 0.5 tablets (15 mg total) by mouth daily. 30 tablet 2  . latanoprost (XALATAN) 0.005 % ophthalmic solution Place 1 drop into both eyes at bedtime.    Marland Kitchen LEVEMIR FLEXTOUCH 100 UNIT/ML Pen as directed. TAKE 30 UNITS    . lisinopril (PRINIVIL,ZESTRIL) 20 MG tablet Take 20 mg by mouth daily.     . montelukast (SINGULAIR) 10 MG tablet Take 10 mg by mouth daily.     Marland Kitchen NITROSTAT 0.4 MG SL tablet 1 TABLET UNDER TONGUE AS NEEDED. MAY RPT. EVERY 5 MIN. UP TO 3 TIMES.CALL 911 IF NOT FULLY RELIEVED 25 tablet 1  . PARoxetine (PAXIL) 20 MG tablet     . potassium chloride (K-DUR) 10 MEQ tablet Take 1 tablet (10 mEq total) by mouth daily. 30 tablet 5  . rosuvastatin (CRESTOR) 10 MG tablet Take 10 mg by mouth daily.      Marland Kitchen spironolactone (ALDACTONE) 25 MG tablet 1/2 tablet (12.5mg ) by mouth daily 15 tablet 6  . testosterone cypionate (DEPOTESTOTERONE CYPIONATE) 200 MG/ML injection every 21 (  twenty-one) days.    . TRADJENTA 5 MG TABS tablet Take 5 mg by mouth daily.    Marland Kitchen trimethoprim-polymyxin b (POLYTRIM) ophthalmic solution   0  . vitamin B-12 (CYANOCOBALAMIN) 1000 MCG tablet Take 1,000 mcg by mouth daily.     No current facility-administered medications for this visit.    Allergies:   Contrast media; Iodine; Mercury; Clopidogrel bisulfate; Latex; and Sulfonamide derivatives    Social History:  The patient  reports that he has never smoked. He has never used smokeless tobacco. He reports that he does not drink alcohol or use illicit drugs.   Family History:  The patient's family history includes Deep vein thrombosis in his brother, mother, and sister; Diabetes in his brother; Heart disease in his brother, brother, and father; Hypertension in his brother, brother, and mother.    ROS:  Please see the history of present illness.   Otherwise, review of systems are positive for mild ankle swelling. He sleeps on 3 pillows. .   All other systems are reviewed and negative.    PHYSICAL EXAM: VS:  BP 138/60 mmHg  Pulse 60  Ht  (1.702 m)  Wt 150 lb 1.9 oz (68.094 kg)  BMI 23.51 kg/m2  SpO2 98% , BMI Body mass index is 23.51 kg/(m^2). GEN: Well nourished, well developed, in no acute distress HEENT: normal Neck: no JVD, carotid bruits, or masses CarRare irregularity otherwiseRRR; no murmurs, rubs, or gallops,no edema  Respiratory:  clear to auscultation bilaterally, normal work of breathing GI: soft, nontender, nondistended, + BS MS: no deformity or atrophy Skin: warm and dry, no rash Neuro:  Strength and sensation are intact Psych: euthymic mood, full affect   EKG:  EKG is ordered today. The ekg ordered today demonstrates sinus bradycardia at 59 bpm, interventricular conduction delay/left bundle branch block, LVH, premature ventricular contractions.   Recent Labs: 02/17/2014: ALT 35; Hemoglobin 12.2*; Platelets 96.0*; TSH 2.56 03/12/2014: Pro B Natriuretic peptide  (BNP) 570.0* 04/21/2014: BUN 53*; Creatinine 2.0*; Potassium 4.4; Sodium 135    Lipid Panel    Component Value Date/Time   CHOL 113 02/26/2014 0854   TRIG 83.0 02/26/2014 0854   HDL 26.50* 02/26/2014 0854   CHOLHDL 4 02/26/2014 0854   VLDL 16.6 02/26/2014 0854   LDLCALC 70 02/26/2014 0854      Wt Readings from Last 3 Encounters:  06/29/14 150 lb 1.9 oz (68.094 kg)  05/12/14 146 lb 3.2  oz (66.316 kg)  04/21/14 144 lb 6.4 oz (65.499 kg)      Other studies Reviewed: Additional studies/ records that were reviewed today include: . Review of the above records demonstrates:    ASSESSMENT AND PLAN:  1. Chronic systolic and diastolic heart failure with minimal lower extremity edema and stable exertional dyspnea. Most recent LVEF was 40%. Discussion concerning AICD/resynchronization was deferred. Plan is aggressive medical therapy to preserve LV function and control symptoms. 2. Coronary artery disease with fleeting chest discomfort. It does not seem that this represents angina as it is random and not associated with other complaints. 3. Chronic kidney disease with last creatinine of 2.0 at which time furosemide was decreased from 120-80 mg per day. This was in November 2015 4. Essential hypertension with good blood pressure control on today's evaluation   Current medicines are reviewed at length with the patient today.  The patient has concerns regarding medicines.  The following changes have been made:  We discussed her furosemide dose. The most recent prescription 7 and which was for 80 mg tablets one half tablet per day was incorrect. This be rectified today. He should also remain on Aldactone 12.5 mg per day  Labs/ tests ordered today include:   Orders Placed This Encounter  Procedures  . Basic metabolic panel  . EKG 12-Lead     Disposition:   FU witH Carrel Leather  in 3 months   Signed, Lesleigh Noe, MD  06/29/2014 10:12 AM    [facility] [address] [place], [place], [place]  [address] Phone: [phone]; Fax: [phone]

## 2014-06-29 NOTE — Patient Instructions (Addendum)
Your physician recommends that you have lab work today--BMET.   Your physician recommends that you schedule a follow-up appointment in: 3-4 months with Dr Katrinka BlazingSmith.

## 2014-07-29 ENCOUNTER — Encounter (HOSPITAL_BASED_OUTPATIENT_CLINIC_OR_DEPARTMENT_OTHER): Payer: 59

## 2014-09-02 ENCOUNTER — Other Ambulatory Visit: Payer: Self-pay | Admitting: Interventional Cardiology

## 2014-09-02 ENCOUNTER — Other Ambulatory Visit: Payer: Self-pay | Admitting: Physician Assistant

## 2014-10-28 ENCOUNTER — Ambulatory Visit (INDEPENDENT_AMBULATORY_CARE_PROVIDER_SITE_OTHER): Payer: Managed Care, Other (non HMO) | Admitting: Interventional Cardiology

## 2014-10-28 ENCOUNTER — Encounter: Payer: Self-pay | Admitting: Interventional Cardiology

## 2014-10-28 VITALS — BP 112/68 | HR 58 | Ht 67.0 in | Wt 147.4 lb

## 2014-10-28 DIAGNOSIS — E785 Hyperlipidemia, unspecified: Secondary | ICD-10-CM

## 2014-10-28 DIAGNOSIS — I272 Other secondary pulmonary hypertension: Secondary | ICD-10-CM

## 2014-10-28 DIAGNOSIS — I50810 Right heart failure, unspecified: Principal | ICD-10-CM

## 2014-10-28 DIAGNOSIS — I5042 Chronic combined systolic (congestive) and diastolic (congestive) heart failure: Secondary | ICD-10-CM | POA: Diagnosis not present

## 2014-10-28 DIAGNOSIS — I2729 Other secondary pulmonary hypertension: Secondary | ICD-10-CM

## 2014-10-28 DIAGNOSIS — G4733 Obstructive sleep apnea (adult) (pediatric): Secondary | ICD-10-CM

## 2014-10-28 DIAGNOSIS — I509 Heart failure, unspecified: Secondary | ICD-10-CM | POA: Diagnosis not present

## 2014-10-28 DIAGNOSIS — I2510 Atherosclerotic heart disease of native coronary artery without angina pectoris: Secondary | ICD-10-CM | POA: Diagnosis not present

## 2014-10-28 DIAGNOSIS — N183 Chronic kidney disease, stage 3 unspecified: Secondary | ICD-10-CM

## 2014-10-28 DIAGNOSIS — I10 Essential (primary) hypertension: Secondary | ICD-10-CM

## 2014-10-28 DIAGNOSIS — I739 Peripheral vascular disease, unspecified: Secondary | ICD-10-CM

## 2014-10-28 NOTE — Progress Notes (Signed)
Cardiology Office Note   Date:  10/28/2014   ID:  Ethan Vargas, DOB 1941/02/25, MRN [id number]  PCP:  Lupita RaiderSHAW,KIMBERLEE, MD  Cardiologist:  Lesleigh NoeSMITH III,Merlean Pizzini W, MD   Chief Complaint  Patient presents with  . CORONARY ATHEROSCLEROSIS NATIVE CORONARY ARTERY      History of Present Illness: Ethan Vargas is a 74 y.o. male who presents for coronary artery disease, prior coronary bypass grafting, diabetes mellitus, chronic kidney disease, and combined systolic and diastolic heart failure.  He is doing well. He one decides lower extremities are doing. He wishes to resume an exercise program. He denies orthopnea PND. He has had no episodes of syncope. He has not needed to use nitroglycerin.    Past Medical History  Diagnosis Date  . CAD (coronary artery disease)     a. Remote CABG 1998. b. Inf MI 2003 s/ DES to SVG-RCA. c. Additional stenting in 2010 - occluded SVG to RCA and DES to SVG-Diag#1. d. Nuc 07/2013 with Intermediate risk stress nuclear study.  There is a medium size defect of moderate severity affecting the apical anterior, mid anterior and mid anteroseptal segments. There is global hypokinesis.  EF 28%.  . Sleep apnea   . Diabetes mellitus     a. c/b neuropathy, nephropathy.  . Ischemic cardiomyopathy   . Hypertension   . Anemia of chronic disease   . Chronic systolic CHF (congestive heart failure)   . Peripheral vascular disease   . Cellulitis Dec./Jan 2011 and 2012  . CKD (chronic kidney disease), stage III   . Hyperlipidemia   . Barrett's esophageal ulceration   . Renal artery stenosis     a. s/p stent 2007.    Past Surgical History  Procedure Laterality Date  . Coronary artery bypass graft  Sept. 1998  . Arterial thrombectomy  2003    X's 2 stents  . Stents surgery  2007  and  2010    Kidney and  Heart  . Knee surgery  1961    Right knee     Current Outpatient Prescriptions  Medication Sig Dispense Refill  . acetaminophen (TYLENOL) 325 MG tablet  Take 325 mg by mouth 2 (two) times daily.    Marland Kitchen. aspirin EC 81 MG tablet Take 81 mg by mouth daily.    Marland Kitchen. azelastine (OPTIVAR) 0.05 % ophthalmic solution Place 1 drop into both eyes daily as needed. To affected eyes      . carvedilol (COREG) 12.5 MG tablet TAKE 1 TABLET TWICE DAILY. 60 tablet 1  . cholecalciferol (VITAMIN D) 1000 UNITS tablet Take 1,000 Units by mouth daily.      Marland Kitchen. CORDRAN 4 MCG/SQCM TAPE as needed. Apply to hands    . doxazosin (CARDURA) 4 MG tablet Take 4 mg by mouth daily.     . ferrous sulfate 325 (65 FE) MG tablet Take 325 mg by mouth daily.     . fluticasone (FLONASE) 50 MCG/ACT nasal spray Place 1 spray into both nostrils 2 (two) times daily.    . furosemide (LASIX) 80 MG tablet Take 1 tablet (80 mg total) by mouth daily. 30 tablet 6  . isosorbide mononitrate (IMDUR) 30 MG 24 hr tablet TAKE 1/2 TABLET EVERY DAY. 15 tablet 3  . latanoprost (XALATAN) 0.005 % ophthalmic solution Place 1 drop into both eyes at bedtime.    Marland Kitchen. LEVEMIR FLEXTOUCH 100 UNIT/ML Pen as directed. TAKE 30 UNITS    . lisinopril (PRINIVIL,ZESTRIL) 20 MG tablet Take 20 mg  by mouth daily.     . montelukast (SINGULAIR) 10 MG tablet Take 10 mg by mouth daily.     Marland Kitchen NITROSTAT 0.4 MG SL tablet 1 TABLET UNDER TONGUE AS NEEDED. MAY RPT. EVERY 5 MIN. UP TO 3 TIMES.CALL 911 IF NOT FULLY RELIEVED 25 tablet 1  . PARoxetine (PAXIL) 20 MG tablet     . potassium chloride (K-DUR) 10 MEQ tablet Take 1 tablet (10 mEq total) by mouth daily. 30 tablet 5  . rosuvastatin (CRESTOR) 10 MG tablet Take 10 mg by mouth daily.      Marland Kitchen spironolactone (ALDACTONE) 25 MG tablet 1/2 tablet (12.5mg ) by mouth daily 15 tablet 6  . testosterone cypionate (DEPOTESTOTERONE CYPIONATE) 200 MG/ML injection every 21 ( twenty-one) days.    . TRADJENTA 5 MG TABS tablet Take 5 mg by mouth daily.    Marland Kitchen trimethoprim-polymyxin b (POLYTRIM) ophthalmic solution   0  . vitamin B-12 (CYANOCOBALAMIN) 1000 MCG tablet Take 1,000 mcg by mouth daily.     No  current facility-administered medications for this visit.    Allergies:   Contrast media; Iodine; Mercury; Clopidogrel bisulfate; Latex; and Sulfonamide derivatives    Social History:  The patient  reports that he has never smoked. He has never used smokeless tobacco. He reports that he does not drink alcohol or use illicit drugs.   Family History:  The patient's family history includes Deep vein thrombosis in his brother, mother, and sister; Diabetes in his brother; Heart disease in his brother, brother, and father; Hypertension in his brother, brother, and mother.    ROS:  Please see the history of present illness.   Otherwise, review of systems are positive for snoring, excessive fatigue, and difficulty with his balance..   All other systems are reviewed and negative.    PHYSICAL EXAM: VS:  BP 112/68 mmHg  Pulse 58  Ht  (1.702 m)  Wt 147 lb 6.4 oz (66.86 kg)  BMI 23.08 kg/m2  SpO2 99% , BMI Body mass index is 23.08 kg/(m^2). GEN: Well nourished, well developed, in no acute distress HEENT: normal Neck: no carotid bruits, or masses. Bilateral JVD is noted. Most notably is a CV wave. Cardiac: RRR; no murmurs, rubs, or gallops,no edema  Respiratory:  clear to auscultation bilaterally, normal work of breathing GI: soft, nontender, nondistended, + BS MS: no deformity or atrophy Skin: warm and dry, no rash Neuro:  Strength and sensation are intact Psych: euthymic mood, full affect   EKG:  EKG is not ordered today.    Recent Labs: 02/17/2014: ALT 35; Hemoglobin 12.2*; Platelets 96.0*; TSH 2.56 03/12/2014: Pro B Natriuretic peptide (BNP) 570.0* 06/29/2014: BUN 32*; Creatinine 1.77*; Potassium 4.8; Sodium 137    Lipid Panel    Component Value Date/Time   CHOL 113 02/26/2014 0854   TRIG 83.0 02/26/2014 0854   HDL 26.50* 02/26/2014 0854   CHOLHDL 4 02/26/2014 0854   VLDL 16.6 02/26/2014 0854   LDLCALC 70 02/26/2014 0854      Wt Readings from Last 3 Encounters:    10/28/14 147 lb 6.4 oz (66.86 kg)  06/29/14 150 lb 1.9 oz (68.094 kg)  05/12/14 146 lb 3.2 oz (66.316 kg)      Other studies Reviewed: Additional studies/ records that were reviewed today include: None.   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  Chronic combined systolic and diastolic heart failure - volume status appears  stable.   Essential hypertension- excellent control  Atherosclerosis of native coronary artery of native heart without angina pectoris- no angina  Chronic kidney disease (CKD), stage III (moderate)  Obstructive sleep apnea  Hyperlipidemia     Current medicines are reviewed at length with the patient today.  The patient does not have concerns regarding medicines.  The following changes have been made:  no change.  We believe he has peripheral vascular disease. We will follow-up with a peripheral ultrasound to rule out progression of occlusive disease.  Labs/ tests ordered today include:  No orders of the defined types were placed in this encounter.     Disposition:   FU with HS in 4 months  Signed, Lesleigh Noe, MD  10/28/2014 8:54 AM    [facility] [address] [place], [place], [place]  [address] Phone: [phone]; Fax: [phone]

## 2014-10-28 NOTE — Patient Instructions (Signed)
Medication Instructions:  Your physician recommends that you continue on your current medications as directed. Please refer to the Current Medication list given to you today.   Labwork: Your physician recommends that you return for lab work sameday as PV study  Testing/Procedures: Your physician has requested that you have a lower extremity arterial exercise duplex. During this test, exercise and ultrasound are used to evaluate arterial blood flow in the legs. Allow one hour for this exam. There are no restrictions or special instructions.  Follow-Up: Your physician wants you to follow-up in: 4 months with Dr.Smith You will receive a reminder letter in the mail two months in advance. If you don't receive a letter, please call our office to schedule the follow-up appointment.   Any Other Special Instructions Will Be Listed Below (If Applicable). Ok to increase physical/exercise activity slowly. You should exercise within the threshold of being able to exercise and have a conversation at the same time.

## 2014-11-03 ENCOUNTER — Other Ambulatory Visit: Payer: Self-pay | Admitting: Interventional Cardiology

## 2014-11-24 ENCOUNTER — Other Ambulatory Visit (INDEPENDENT_AMBULATORY_CARE_PROVIDER_SITE_OTHER): Payer: Managed Care, Other (non HMO) | Admitting: *Deleted

## 2014-11-24 ENCOUNTER — Ambulatory Visit (HOSPITAL_COMMUNITY): Payer: Managed Care, Other (non HMO) | Attending: Cardiovascular Disease

## 2014-11-24 DIAGNOSIS — Z951 Presence of aortocoronary bypass graft: Secondary | ICD-10-CM | POA: Insufficient documentation

## 2014-11-24 DIAGNOSIS — I2510 Atherosclerotic heart disease of native coronary artery without angina pectoris: Secondary | ICD-10-CM | POA: Insufficient documentation

## 2014-11-24 DIAGNOSIS — E119 Type 2 diabetes mellitus without complications: Secondary | ICD-10-CM | POA: Diagnosis not present

## 2014-11-24 DIAGNOSIS — I739 Peripheral vascular disease, unspecified: Secondary | ICD-10-CM | POA: Diagnosis not present

## 2014-11-24 DIAGNOSIS — E785 Hyperlipidemia, unspecified: Secondary | ICD-10-CM | POA: Diagnosis not present

## 2014-11-24 DIAGNOSIS — I5042 Chronic combined systolic (congestive) and diastolic (congestive) heart failure: Secondary | ICD-10-CM

## 2014-11-24 DIAGNOSIS — I10 Essential (primary) hypertension: Secondary | ICD-10-CM | POA: Insufficient documentation

## 2014-11-24 LAB — BASIC METABOLIC PANEL
BUN: 49 mg/dL — ABNORMAL HIGH (ref 6–23)
CALCIUM: 9.4 mg/dL (ref 8.4–10.5)
CO2: 28 mEq/L (ref 19–32)
Chloride: 104 mEq/L (ref 96–112)
Creatinine, Ser: 2.18 mg/dL — ABNORMAL HIGH (ref 0.40–1.50)
GFR: 31.75 mL/min — ABNORMAL LOW (ref >60.00)
GLUCOSE: 254 mg/dL — AB (ref 70–99)
Potassium: 4.8 mEq/L (ref 3.5–5.1)
SODIUM: 138 meq/L (ref 135–145)

## 2014-11-25 ENCOUNTER — Telehealth: Payer: Self-pay

## 2014-11-25 NOTE — Telephone Encounter (Signed)
-----   Message from [name], MD sent at 11/24/2014  4:23 PM EDT ----- Kidney function and potassium are stable

## 2014-12-14 ENCOUNTER — Encounter: Payer: Self-pay | Admitting: Cardiovascular Disease

## 2014-12-14 ENCOUNTER — Ambulatory Visit (INDEPENDENT_AMBULATORY_CARE_PROVIDER_SITE_OTHER): Payer: Managed Care, Other (non HMO) | Admitting: Cardiovascular Disease

## 2014-12-14 VITALS — BP 90/60 | HR 64 | Ht 67.0 in | Wt 147.0 lb

## 2014-12-14 DIAGNOSIS — R0989 Other specified symptoms and signs involving the circulatory and respiratory systems: Secondary | ICD-10-CM | POA: Diagnosis not present

## 2014-12-14 MED ORDER — DOXAZOSIN MESYLATE 2 MG PO TABS: 2.0000 mg | ORAL_TABLET | Freq: Every day | ORAL | Status: DC

## 2014-12-14 NOTE — Patient Instructions (Signed)
Medication Instructions:  Your physician has recommended you make the following change in your medication:  1. DECREASE Cardura to 2mg  take one by mouth daily  Labwork: No new orders.   Testing/Procedures: Your physician has requested that you have a carotid duplex. This test is an ultrasound of the carotid arteries in your neck. It looks at blood flow through these arteries that supply the brain with blood. Allow one hour for this exam. There are no restrictions or special instructions.  Follow-Up: Your physician recommends that you schedule a follow-up appointment as needed with Dr Kirke CorinArida.    Any Other Special Instructions Will Be Listed Below (If Applicable).

## 2014-12-14 NOTE — Progress Notes (Signed)
Cardiology Office Note   Date:  12/14/2014   ID:  Gyasi, Hazzard 04/06/1941, MRN [id number]  PCP:  Lupita Raider, MD  Cardiologist:  Verdis Prime, MD  Chief Complaint  Patient presents with  . Leg Swelling    ANKLES      History of Present Illness: Ethan Vargas is a 74 y.o. male who was referred from Dr. Katrinka Blazing for evaluation and management of peripheral arterial disease. He has multiple chronic medical conditions that include coronary artery disease, prior coronary bypass grafting in 1998, diabetes mellitus, chronic kidney disease, and combined systolic and diastolic heart failure. He reports having left lower extremity cellulitis which was severe and prolonged in 2012. This has caused him significant residual pain, discoloration and swelling since then. He has been told in the past about the possibility of peripheral arterial disease. He underwent noninvasive evaluation recently which showed an ABI of 0.94 on the right and 0.89 on the left. There was triphasic waveform all the way down to the popliteal artery and then it became biphasic suggestive of tibial disease. The patient denies any claudication. He has no lower extremity wounds or ulcerations. He is not a smoker. His blood pressure is noted to be low today. He has been complaining of intermittent dizziness over the last month.    Past Medical History  Diagnosis Date  . CAD (coronary artery disease)     a. Remote CABG 1998. b. Inf MI 2003 s/ DES to SVG-RCA. c. Additional stenting in 2010 - occluded SVG to RCA and DES to SVG-Diag#1. d. Nuc 07/2013 with Intermediate risk stress nuclear study.  There is a medium size defect of moderate severity affecting the apical anterior, mid anterior and mid anteroseptal segments. There is global hypokinesis.  EF 28%.  . Sleep apnea   . Diabetes mellitus     a. c/b neuropathy, nephropathy.  . Ischemic cardiomyopathy   . Hypertension   . Anemia of chronic disease   . Chronic  systolic CHF (congestive heart failure)   . Peripheral vascular disease   . Cellulitis Dec./Jan 2011 and 2012  . CKD (chronic kidney disease), stage III   . Hyperlipidemia   . Barrett's esophageal ulceration   . Renal artery stenosis     a. s/p stent 2007.    Past Surgical History  Procedure Laterality Date  . Coronary artery bypass graft  Sept. 1998  . Arterial thrombectomy  2003    X's 2 stents  . Stents surgery  2007  and  2010    [facility]  . Knee surgery  1961    Right knee     Current Outpatient Prescriptions  Medication Sig Dispense Refill  . acetaminophen (TYLENOL) 325 MG tablet Take 325 mg by mouth 2 (two) times daily.    Marland Kitchen aspirin EC 81 MG tablet Take 81 mg by mouth daily.    Marland Kitchen azelastine (OPTIVAR) 0.05 % ophthalmic solution Place 1 drop into both eyes daily as needed. To affected eyes      . carvedilol (COREG) 12.5 MG tablet TAKE 1 TABLET TWICE DAILY. 60 tablet 4  . cholecalciferol (VITAMIN D) 1000 UNITS tablet Take 1,000 Units by mouth daily.      Marland Kitchen CORDRAN 4 MCG/SQCM TAPE as needed. Apply to hands    . doxazosin (CARDURA) 4 MG tablet Take 4 mg by mouth daily.     . ferrous sulfate 325 (65 FE) MG tablet Take 325 mg by mouth daily.     Marland Kitchen  fluticasone (FLONASE) 50 MCG/ACT nasal spray Place 1 spray into both nostrils 2 (two) times daily.    . furosemide (LASIX) 80 MG tablet Take 1 tablet (80 mg total) by mouth daily. 30 tablet 6  . isosorbide mononitrate (IMDUR) 30 MG 24 hr tablet TAKE 1/2 TABLET EVERY DAY. 15 tablet 3  . latanoprost (XALATAN) 0.005 % ophthalmic solution Place 1 drop into both eyes at bedtime.    Marland Kitchen. LEVEMIR FLEXTOUCH 100 UNIT/ML Pen as directed. TAKE 30 UNITS    . lisinopril (PRINIVIL,ZESTRIL) 20 MG tablet Take 20 mg by mouth daily.     . montelukast (SINGULAIR) 10 MG tablet Take 10 mg by mouth daily.     Marland Kitchen. NITROSTAT 0.4 MG SL tablet 1 TABLET UNDER TONGUE AS NEEDED. MAY RPT. EVERY 5 MIN. UP TO 3 TIMES.CALL 911 IF NOT FULLY RELIEVED 25 tablet 1    . PARoxetine (PAXIL) 20 MG tablet     . potassium chloride (K-DUR) 10 MEQ tablet Take 1 tablet (10 mEq total) by mouth daily. 30 tablet 5  . rosuvastatin (CRESTOR) 10 MG tablet Take 10 mg by mouth daily.      Marland Kitchen. spironolactone (ALDACTONE) 25 MG tablet 1/2 tablet (12.5mg ) by mouth daily 15 tablet 6  . testosterone cypionate (DEPOTESTOTERONE CYPIONATE) 200 MG/ML injection every 21 ( twenty-one) days.    . TRADJENTA 5 MG TABS tablet Take 5 mg by mouth daily.    . vitamin B-12 (CYANOCOBALAMIN) 1000 MCG tablet Take 1,000 mcg by mouth daily.     No current facility-administered medications for this visit.    Allergies:   Contrast media; Iodine; Mercury; Clopidogrel bisulfate; Latex; and Sulfonamide derivatives    Social History:  The patient  reports that he has never smoked. He has never used smokeless tobacco. He reports that he does not drink alcohol or use illicit drugs.   Family History:  The patient's family history includes Deep vein thrombosis in his brother, mother, and sister; Diabetes in his brother; Heart disease in his brother, brother, and father; Hypertension in his brother, brother, and mother.    ROS:  Please see the history of present illness.   Otherwise, review of systems are positive for snoring, excessive fatigue, and difficulty with his balance..   All other systems are reviewed and negative.    PHYSICAL EXAM: VS:  Ht 5\' 7"  (1.702 m)  Wt 147 lb (66.679 kg)  BMI 23.02 kg/m2 , BMI Body mass index is 23.02 kg/(m^2). GEN: Well nourished, well developed, in no acute distress HEENT: normal Neck: no carotid bruits, or masses. Bilateral JVD is noted.  Cardiac: RRR; no murmurs, rubs, or gallops,no edema  Respiratory:  clear to auscultation bilaterally, normal work of breathing GI: soft, nontender, nondistended, + BS MS: no deformity or atrophy Skin: warm and dry, no rash Neuro:  Strength and sensation are intact Psych: euthymic mood, full affect Vascular: Femoral pulses  are normal bilaterally, posterior tibial: +1 on the right side and absent on the left side. Dorsalis pedis: +1 on the right side and absent on the left side. There is asymmetric edema affecting mainly the left leg. He does have faint bilateral carotid bruits.   EKG:  EKG is not ordered today.    Recent Labs: 02/17/2014: ALT 35; Hemoglobin 12.2*; Platelets 96.0*; TSH 2.56 03/12/2014: Pro B Natriuretic peptide (BNP) 570.0* 11/24/2014: BUN 49*; Creatinine, Ser 2.18*; Potassium 4.8; Sodium 138    Lipid Panel    Component Value Date/Time   CHOL 113 02/26/2014  0854   TRIG 83.0 02/26/2014 0854   HDL 26.50* 02/26/2014 0854   CHOLHDL 4 02/26/2014 0854   VLDL 16.6 02/26/2014 0854   LDLCALC 70 02/26/2014 0854      Wt Readings from Last 3 Encounters:  12/14/14 147 lb (66.679 kg)  10/28/14 147 lb 6.4 oz (66.86 kg)  06/29/14 150 lb 1.9 oz (68.094 kg)      Other studies Reviewed: Additional studies/ records that were reviewed today include: None.   ASSESSMENT AND PLAN:  1. Peripheral arterial disease: The patient overall has mild peripheral arterial disease mainly affecting the tibial vessels. Currently, he has no claudication or lower extremity ulceration. His ABI was only borderline to mildly reduced. Currently, there is no indication for angiography or revascularization. I instructed him on the proper foot hygiene. I also advised him to resume his exercise program. I asked him to follow-up with me if he develops symptoms related to this.  2. Bilateral carotid bruits: Faint on both sides. I requested carotid Doppler.  3. Coronary artery disease: The patient denies anginal symptoms.   4. Chronic combined systolic and diastolic heart failure - volume status appears stable.   5. Essential hypertension- blood pressure has been running low and he complains of mild dizziness. I decreased the dose of Cardura to 2 mg once daily. He reports no history of BPH.     Signed,  Lorine Bears, MD  12/14/2014 8:27 AM     [facility] [address] [place], [place], [place]  [address] Phone: [phone]; Fax: [phone]

## 2014-12-16 ENCOUNTER — Ambulatory Visit (HOSPITAL_COMMUNITY): Payer: Managed Care, Other (non HMO) | Attending: Internal Medicine

## 2014-12-16 DIAGNOSIS — I6523 Occlusion and stenosis of bilateral carotid arteries: Secondary | ICD-10-CM | POA: Insufficient documentation

## 2014-12-16 DIAGNOSIS — R0989 Other specified symptoms and signs involving the circulatory and respiratory systems: Secondary | ICD-10-CM | POA: Diagnosis not present

## 2014-12-17 ENCOUNTER — Telehealth: Payer: Self-pay | Admitting: Cardiovascular Disease

## 2014-12-17 NOTE — Telephone Encounter (Signed)
I spoke with the pt and made him aware of carotid duplex results.  Dr Kirke Corin would like the pt to see Dr Allyson Sabal for further evaluation. Appointment scheduled on 12/24/14 at 9:00. Pt aware of location.

## 2014-12-17 NOTE — Telephone Encounter (Signed)
Follow Up    Pt is calling in to follow up on test results. Please call.

## 2014-12-24 ENCOUNTER — Ambulatory Visit (INDEPENDENT_AMBULATORY_CARE_PROVIDER_SITE_OTHER): Payer: Managed Care, Other (non HMO) | Admitting: Cardiovascular Disease

## 2014-12-24 ENCOUNTER — Encounter: Payer: Self-pay | Admitting: Cardiovascular Disease

## 2014-12-24 VITALS — BP 118/66 | HR 54 | Ht 67.0 in | Wt 149.0 lb

## 2014-12-24 DIAGNOSIS — I739 Peripheral vascular disease, unspecified: Secondary | ICD-10-CM

## 2014-12-24 DIAGNOSIS — I6529 Occlusion and stenosis of unspecified carotid artery: Secondary | ICD-10-CM

## 2014-12-24 DIAGNOSIS — I779 Disorder of arteries and arterioles, unspecified: Secondary | ICD-10-CM | POA: Insufficient documentation

## 2014-12-24 NOTE — Progress Notes (Signed)
12/24/2014 Alphonzo Cruise   17-Sep-1940  [id number]  Primary Physician Lupita Raider, MD Primary Cardiologist: Runell Gess MD Roseanne Reno   HPI:  Mr. Fogg is a delightful 74 year old thin-appearing married Caucasian male father of 2 children, grandfather to 3 grandchildren referred by Dr. Kirke Corin for peripheral vascular evaluation because of asymptomatic high-grade right ICA stenosis. His cardiologist is Dr. Katrinka Blazing. There is a history of ischemic heart disease status post coronary artery bypass grafting 1998, heart attack and 03 and 2 stents placed in 2010. He does have ejection fraction of 40% as was had congestive heart failure in the past. He has a history of diabetes, hypertension and hyperlipidemia all treated. He does have a strong family history for heart disease. 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 He complains of mild dyspnea on exertion. He's had renal artery stenting back in 2007 by Dr. Fredia Sorrow. He does have moderate renal insufficiency with a creatinine in the 2 range and a creatinine clearance in the 30 mL/m range. Recent carotid Dopplers performed 12/16/14 showed moderately severe right ICA stenosis.   Current Outpatient Prescriptions  Medication Sig Dispense Refill  . acetaminophen (TYLENOL) 325 MG tablet Take 325 mg by mouth 2 (two) times daily.    Marland Kitchen aspirin EC 81 MG tablet Take 81 mg by mouth daily.    Marland Kitchen azelastine (OPTIVAR) 0.05 % ophthalmic solution Place 1 drop into both eyes daily as needed (ALLERGIES AND DRY EYES). To affected eyes     . carvedilol (COREG) 12.5 MG tablet TAKE 1 TABLET TWICE DAILY. 60 tablet 4  . cholecalciferol (VITAMIN D) 1000 UNITS tablet Take 1,000 Units by mouth daily.      Marland Kitchen CORDRAN 4 MCG/SQCM TAPE as needed (FOR DRY CRACKED HANDS). Apply to hands    . doxazosin (CARDURA) 2 MG tablet Take 1 tablet (2 mg total) by mouth daily. 30 tablet 11  . ferrous sulfate 325 (65 FE) MG tablet Take 325 mg by  mouth daily.     . fluticasone (FLONASE) 50 MCG/ACT nasal spray Place 1 spray into both nostrils 2 (two) times daily.    . furosemide (LASIX) 80 MG tablet Take 1 tablet (80 mg total) by mouth daily. 30 tablet 6  . isosorbide mononitrate (IMDUR) 30 MG 24 hr tablet TAKE 1/2 TABLET EVERY DAY. 15 tablet 3  . latanoprost (XALATAN) 0.005 % ophthalmic solution Place 1 drop into both eyes at bedtime.    Marland Kitchen LEVEMIR FLEXTOUCH 100 UNIT/ML Pen as directed. TAKE 30 UNITS    . lisinopril (PRINIVIL,ZESTRIL) 20 MG tablet Take 20 mg by mouth daily.     . montelukast (SINGULAIR) 10 MG tablet Take 10 mg by mouth daily.     Marland Kitchen NITROSTAT 0.4 MG SL tablet 1 TABLET UNDER TONGUE AS NEEDED. MAY RPT. EVERY 5 MIN. UP TO 3 TIMES.CALL 911 IF NOT FULLY RELIEVED 25 tablet 1  . PARoxetine (PAXIL) 20 MG tablet Take 20 mg by mouth daily.     . potassium chloride (K-DUR) 10 MEQ tablet Take 1 tablet (10 mEq total) by mouth daily. 30 tablet 5  . rosuvastatin (CRESTOR) 10 MG tablet Take 10 mg by mouth daily.      Marland Kitchen spironolactone (ALDACTONE) 25 MG tablet 1/2 tablet (12.5mg ) by mouth daily 15 tablet 6  . testosterone cypionate (DEPOTESTOTERONE CYPIONATE) 200 MG/ML injection every 21 ( twenty-one) days.    . TRADJENTA 5 MG TABS tablet Take 5 mg by mouth daily.    Marland Kitchen  vitamin B-12 (CYANOCOBALAMIN) 1000 MCG tablet Take 1,000 mcg by mouth daily.     No current facility-administered medications for this visit.    Allergies  Allergen Reactions  . Contrast Media [Iodinated Diagnostic Agents] Swelling    Premedication with prednisone  . Iodine Swelling  . Mercury Swelling  . Clopidogrel Bisulfate Rash  . Latex Other (See Comments)    Oral nausea  . Sulfonamide Derivatives Other (See Comments)    unknown    History   Social History  . Marital Status: Married    Spouse Name: N/A  . Number of Children: N/A  . Years of Education: N/A   Occupational History  . Not on file.   Social History Main Topics  . Smoking status: Never  Smoker   . Smokeless tobacco: Never Used  . Alcohol Use: No  . Drug Use: No  . Sexual Activity: Not on file   Other Topics Concern  . Not on file   Social History Narrative   Lives in [place] with [place].  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     Review of Systems: General: negative for chills, fever, night sweats or weight changes.  Cardiovascular: negative for chest pain, dyspnea on exertion, edema, orthopnea, palpitations, paroxysmal nocturnal dyspnea or shortness of breath Dermatological: negative for rash Respiratory: negative for cough or wheezing Urologic: negative for hematuria Abdominal: negative for nausea, vomiting, diarrhea, bright red blood per rectum, melena, or hematemesis Neurologic: negative for visual changes, syncope, or dizziness All other systems reviewed and are otherwise negative except as noted above.    Blood pressure 118/66, pulse 54, height 5\' 7"  (1.702 m), weight 149 lb (67.586 kg).  General appearance: alert and no distress Neck: no adenopathy, no JVD, supple, symmetrical, trachea midline, thyroid not enlarged, symmetric, no tenderness/mass/nodules and rright carotid bruit Lungs: clear to auscultation bilaterally Heart: regular rate and rhythm, S1, S2 normal, no murmur, click, rub or gallop Extremities: extremities normal, atraumatic, no cyanosis or edema  EKG not performed today  ASSESSMENT AND PLAN:   Carotid artery disease Mr. Cada is a delightful 74 year old gentleman referred to me by Dr. Kirke Corin for evaluation of asymptomatic high-grade right ICA stenosis. He has a history of ischemic heart disease status post remote bypass grafting, subsequent myocardial infarction and stenting as recently as 2010. His ejection fraction is in the 40% range. He has had congestive heart failure in the past and has a serum creatinine of 2 creatinine clearance of 30. Recent carotid Dopplers suggest moderately severe right IV stenosis  in the 70-80% range. He is neurologically asymptomatic. He is excluded from the crest 2 study because of renal insufficiency.I'm going to refer him to Dr. Myra Gianotti  for consideration of right internal carotid endarterectomy.      Runell Gess MD FACP,FACC,FAHA, [facility] 12/24/2014 9:23 AM

## 2014-12-24 NOTE — Patient Instructions (Signed)
Dr Allyson Sabal has referred you to Dr Myra Gianotti for carotid artery evaluation.

## 2014-12-24 NOTE — Assessment & Plan Note (Signed)
Ethan Vargas is a delightful 74 year old gentleman referred to me by Dr. Kirke Corin for evaluation of asymptomatic high-grade right ICA stenosis. He has a history of ischemic heart disease status post remote bypass grafting, subsequent myocardial infarction and stenting as recently as 2010. His ejection fraction is in the 40% range. He has had congestive heart failure in the past and has a serum creatinine of 2 creatinine clearance of 30. Recent carotid Dopplers suggest moderately severe right IV stenosis in the 70-80% range. He is neurologically asymptomatic. He is excluded from the crest 2 study because of renal insufficiency.I'm going to refer him to Dr. Myra Gianotti  for consideration of right internal carotid endarterectomy.

## 2015-01-03 ENCOUNTER — Other Ambulatory Visit: Payer: Self-pay | Admitting: *Deleted

## 2015-01-03 ENCOUNTER — Other Ambulatory Visit: Payer: Self-pay | Admitting: Interventional Cardiology

## 2015-01-03 DIAGNOSIS — I6521 Occlusion and stenosis of right carotid artery: Secondary | ICD-10-CM

## 2015-02-07 ENCOUNTER — Other Ambulatory Visit: Payer: Self-pay | Admitting: Interventional Cardiology

## 2015-02-11 ENCOUNTER — Encounter: Payer: Self-pay | Admitting: Surgery

## 2015-02-14 ENCOUNTER — Ambulatory Visit (HOSPITAL_COMMUNITY)
Admission: RE | Admit: 2015-02-14 | Discharge: 2015-02-14 | Disposition: A | Discharge status: Home / Self Care | Payer: Managed Care, Other (non HMO) | Source: Ambulatory Visit | Attending: Surgery | Admitting: Surgery

## 2015-02-14 ENCOUNTER — Other Ambulatory Visit: Payer: Self-pay

## 2015-02-14 ENCOUNTER — Ambulatory Visit (INDEPENDENT_AMBULATORY_CARE_PROVIDER_SITE_OTHER): Payer: Managed Care, Other (non HMO) | Admitting: Surgery

## 2015-02-14 ENCOUNTER — Encounter: Payer: Self-pay | Admitting: Surgery

## 2015-02-14 VITALS — BP 101/50 | HR 65 | Temp 98.4°F | Resp 16 | Ht 67.0 in | Wt 149.0 lb

## 2015-02-14 DIAGNOSIS — I6521 Occlusion and stenosis of right carotid artery: Secondary | ICD-10-CM

## 2015-02-14 NOTE — Progress Notes (Signed)
Patient name: Ethan Vargas MRN: [id number] DOB: 04-04-1941 Sex: male   Referred by: Dr. Allyson Sabal  Reason for referral:  Chief Complaint  Patient presents with  . New Evaluation    Ref. by Dr. Allyson Sabal C/O  Right  Carotid , Pt. has dizziness off/on  2 - 3 mo.     HISTORY OF PRESENT ILLNESS: This is a 74 year old gentleman who was referred today for evaluation of right carotid stenosis.  The patient has been followed with serial ultrasounds.  His most recent ultrasound showed regression of disease on the right to greater than 80%.  He is asymptomatic.  Specifically, he denies numbness or weakness in either extremity.  He denies third speech.  He denies amaurosis fugax.  The patient suffers from diabetes.  This is moderately well controlled.  His most recent hemoglobin A1c was 6.8.  He has a history of coronary artery disease.  He is status post CABG in 1998.  He has had a myocardial infarction in the past.  He has undergone percutaneous stenting in 2003 and 2010.  He also suffers from congestive heart failure.  He is a nonsmoker.  He is on multiple medications for hypertension including an ACE inhibitor.  He has undergone stenting of his renal artery in the past.  This was for hypertension.  He is medically managed for her cholesterol with a statin.  There is a strong family history for early cardiovascular disease.  Past Medical History  Diagnosis Date  . CAD (coronary artery disease)     a. Remote CABG 1998. b. Inf MI 2003 s/ DES to SVG-RCA. c. Additional stenting in 2010 - occluded SVG to RCA and DES to SVG-Diag#1. d. Nuc 07/2013 with Intermediate risk stress nuclear study.  There is a medium size defect of moderate severity affecting the apical anterior, mid anterior and mid anteroseptal segments. There is global hypokinesis.  EF 28%.  . Sleep apnea   . Diabetes mellitus     a. c/b neuropathy, nephropathy.  . Ischemic cardiomyopathy   . Hypertension   . Anemia of chronic disease   .  Chronic systolic CHF (congestive heart failure)   . Peripheral vascular disease   . Cellulitis Dec./Jan 2011 and 2012  . CKD (chronic kidney disease), stage III   . Hyperlipidemia   . Barrett's esophageal ulceration   . Renal artery stenosis     a. s/p stent 2007.  . Right-sided carotid artery disease   . Atrial fibrillation   . Myocardial infarction Feb. 2003    Heart Attack    Past Surgical History  Procedure Laterality Date  . Coronary artery bypass graft  Sept. 1998  . Arterial thrombectomy  2003    X's 2 stents  . Stents surgery  2007  and  2010    Kidney  ( 2007 ) and  Heart ( Sept. 1998 )  . Knee surgery  1961    Right knee    Social History   Social History  . Marital Status: Married    Spouse Name: N/A  . Number of Children: N/A  . Years of Education: N/A   Occupational History  . Not on file.   Social History Main Topics  . Smoking status: Never Smoker   . Smokeless tobacco: Never Used  . Alcohol Use: No  . Drug Use: No  . Sexual Activity: Not on file   Other Topics Concern  . Not on file   Social History Narrative  Lives in [place] with spouse.  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    Family History  Problem Relation Age of Onset  . Hypertension Mother   . Deep vein thrombosis Mother   . Heart disease Father     Heart Disease before age 13  . Heart attack Father   . Deep vein thrombosis Sister     Amputation  . Diabetes Brother   . Heart disease Brother     Anerysm stomach  . Hypertension Brother   . Deep vein thrombosis Brother     Amputation  . Heart disease Brother     Heart disease before age 64  . Hypertension Brother   . Stroke Neg Hx     Allergies as of 02/14/2015 - Review Complete 02/14/2015  Allergen Reaction Noted  . Contrast media [iodinated diagnostic agents] Swelling 05/28/2011  . Iodine Swelling   . Mercury Swelling   . Clopidogrel bisulfate Rash   . Latex Other (See Comments)   .  Sulfonamide derivatives Other (See Comments)     Current Outpatient Prescriptions on File Prior to Visit  Medication Sig Dispense Refill  . acetaminophen (TYLENOL) 325 MG tablet Take 325 mg by mouth 2 (two) times daily.    Marland Kitchen aspirin EC 81 MG tablet Take 81 mg by mouth daily.    Marland Kitchen azelastine (OPTIVAR) 0.05 % ophthalmic solution Place 1 drop into both eyes daily as needed (ALLERGIES AND DRY EYES). To affected eyes     . carvedilol (COREG) 12.5 MG tablet TAKE 1 TABLET TWICE DAILY. 60 tablet 4  . cholecalciferol (VITAMIN D) 1000 UNITS tablet Take 1,000 Units by mouth daily.      Marland Kitchen CORDRAN 4 MCG/SQCM TAPE as needed (FOR DRY CRACKED HANDS). Apply to hands    . doxazosin (CARDURA) 2 MG tablet Take 1 tablet (2 mg total) by mouth daily. 30 tablet 11  . ferrous sulfate 325 (65 FE) MG tablet Take 325 mg by mouth daily.     . furosemide (LASIX) 80 MG tablet TAKE 1 TABLET ONCE DAILY. 30 tablet 0  . isosorbide mononitrate (IMDUR) 30 MG 24 hr tablet TAKE 1/2 TABLET EVERY DAY. 15 tablet 1  . latanoprost (XALATAN) 0.005 % ophthalmic solution Place 1 drop into both eyes at bedtime.    Marland Kitchen LEVEMIR FLEXTOUCH 100 UNIT/ML Pen as directed. TAKE 30 UNITS    . lisinopril (PRINIVIL,ZESTRIL) 20 MG tablet Take 20 mg by mouth daily.     . montelukast (SINGULAIR) 10 MG tablet Take 10 mg by mouth daily.     Marland Kitchen NITROSTAT 0.4 MG SL tablet 1 TABLET UNDER TONGUE AS NEEDED. MAY RPT. EVERY 5 MIN. UP TO 3 TIMES.CALL 911 IF NOT FULLY RELIEVED 25 tablet 1  . PARoxetine (PAXIL) 20 MG tablet Take 20 mg by mouth daily.     . potassium chloride (K-DUR) 10 MEQ tablet Take 1 tablet (10 mEq total) by mouth daily. 30 tablet 5  . rosuvastatin (CRESTOR) 10 MG tablet Take 10 mg by mouth daily.      Marland Kitchen spironolactone (ALDACTONE) 25 MG tablet TAKE 1/2 TABLET DAILY. 15 tablet 0  . testosterone cypionate (DEPOTESTOTERONE CYPIONATE) 200 MG/ML injection every 21 ( twenty-one) days.    . TRADJENTA 5 MG TABS tablet Take 5 mg by mouth daily.    .  vitamin B-12 (CYANOCOBALAMIN) 1000 MCG tablet Take 1,000 mcg by mouth daily.    . fluticasone (FLONASE) 50 MCG/ACT nasal spray Place 1 spray into both nostrils  2 (two) times daily.     No current facility-administered medications on file prior to visit.     REVIEW OF SYSTEMS: Cardiovascular: No chest pain, chest pressure,  No history of DVT or phlebitis. positive for shortness of breath with exertion and leg swelling  Pulmonary: No productive cough, asthma or wheezing. Neurologic: No weakness, paresthesias, aphasia, or amaurosis. positive for  dizziness. Hematologic: No bleeding problems or clotting disorders. Musculoskeletal: No joint pain or joint swelling. Gastrointestinal: No blood in stool or hematemesis Genitourinary: No dysuria or hematuria. Psychiatric:: No history of major depression. Integumentary: No rashes or ulcers. Constitutional: No fever or chills.  PHYSICAL EXAMINATION:  Filed Vitals:   02/14/15 1324 02/14/15 1328  BP: 97/33 101/50  Pulse: 69 65  Temp:  98.4 F (36.9 C)  TempSrc:  Oral  Resp:  16  Height:   (1.702 m)  Weight:  149 lb (67.586 kg)  SpO2:  98%   Body mass index is 23.33 kg/(m^2). General: The patient appears their stated age.   HEENT:  No gross abnormalities Pulmonary: Respirations are non-labored Musculoskeletal: There are no major deformities.   Neurologic: No focal weakness or paresthesias are detected, Skin: There are no ulcer or rashes noted. Psychiatric: The patient has normal affect. Cardiovascular: There is a regular rate and rhythm without significant murmur appreciated. palpable pedal pulses.  No carotid bruits.    Diagnostic Studies: I have reviewed his carotid Doppler studies which showed greater than 80% right carotid stenosis.  The bifurcation is in the mid neck.  There is 1-39 percent stenosis on the left   Assessment:  asymptomatic right carotid stenosis  Plan: I discussed our options for treatment which include  medical management versus endarterectomy.  The patient decided to proceed with right carotid endarterectomy.  The risks and benefits of the operation were discussed with the patient including the risk of stroke and nerve injury.  All his questions were answered.  His operations been scheduled for Friday, September 30.       Jorge Ny, M.D. [facility] Office: [phone] Pager:  [phone]

## 2015-02-21 ENCOUNTER — Encounter (HOSPITAL_COMMUNITY): Payer: Self-pay

## 2015-02-21 ENCOUNTER — Encounter (HOSPITAL_COMMUNITY)
Admission: RE | Admit: 2015-02-21 | Discharge: 2015-02-21 | Disposition: A | Discharge status: Home / Self Care | Payer: Managed Care, Other (non HMO) | Source: Ambulatory Visit | Attending: Surgery | Admitting: Surgery

## 2015-02-21 DIAGNOSIS — Z951 Presence of aortocoronary bypass graft: Secondary | ICD-10-CM | POA: Insufficient documentation

## 2015-02-21 DIAGNOSIS — Z7982 Long term (current) use of aspirin: Secondary | ICD-10-CM | POA: Insufficient documentation

## 2015-02-21 DIAGNOSIS — E1121 Type 2 diabetes mellitus with diabetic nephropathy: Secondary | ICD-10-CM | POA: Diagnosis not present

## 2015-02-21 DIAGNOSIS — E1142 Type 2 diabetes mellitus with diabetic polyneuropathy: Secondary | ICD-10-CM | POA: Diagnosis not present

## 2015-02-21 DIAGNOSIS — Z01812 Encounter for preprocedural laboratory examination: Secondary | ICD-10-CM | POA: Diagnosis not present

## 2015-02-21 DIAGNOSIS — I252 Old myocardial infarction: Secondary | ICD-10-CM | POA: Insufficient documentation

## 2015-02-21 DIAGNOSIS — Z79899 Other long term (current) drug therapy: Secondary | ICD-10-CM | POA: Diagnosis not present

## 2015-02-21 DIAGNOSIS — E785 Hyperlipidemia, unspecified: Secondary | ICD-10-CM | POA: Diagnosis not present

## 2015-02-21 DIAGNOSIS — G4733 Obstructive sleep apnea (adult) (pediatric): Secondary | ICD-10-CM | POA: Insufficient documentation

## 2015-02-21 DIAGNOSIS — Z0183 Encounter for blood typing: Secondary | ICD-10-CM | POA: Insufficient documentation

## 2015-02-21 DIAGNOSIS — I5022 Chronic systolic (congestive) heart failure: Secondary | ICD-10-CM | POA: Insufficient documentation

## 2015-02-21 DIAGNOSIS — Z01818 Encounter for other preprocedural examination: Secondary | ICD-10-CM | POA: Insufficient documentation

## 2015-02-21 DIAGNOSIS — I6521 Occlusion and stenosis of right carotid artery: Secondary | ICD-10-CM | POA: Diagnosis not present

## 2015-02-21 DIAGNOSIS — I2510 Atherosclerotic heart disease of native coronary artery without angina pectoris: Secondary | ICD-10-CM | POA: Diagnosis not present

## 2015-02-21 HISTORY — DX: Personal history of other diseases of the digestive system: Z87.19

## 2015-02-21 HISTORY — DX: Other specified postprocedural states: R11.2

## 2015-02-21 HISTORY — DX: Pneumonia, unspecified organism: J18.9

## 2015-02-21 HISTORY — DX: Reserved for inherently not codable concepts without codable children: IMO0001

## 2015-02-21 HISTORY — DX: Nausea with vomiting, unspecified: Z98.890

## 2015-02-21 HISTORY — DX: Unspecified osteoarthritis, unspecified site: M19.90

## 2015-02-21 LAB — URINALYSIS, ROUTINE W REFLEX MICROSCOPIC
Bilirubin Urine: NEGATIVE
Glucose, UA: >1000 mg/dL — AB
Ketones, ur: NEGATIVE mg/dL
NITRITE: NEGATIVE
PROTEIN: 30 mg/dL — AB
SPECIFIC GRAVITY, URINE: 1.019 (ref 1.005–1.030)
UROBILINOGEN UA: 0.2 mg/dL (ref 0.0–1.0)
pH: 5 (ref 5.0–8.0)

## 2015-02-21 LAB — CBC
HEMATOCRIT: 37.2 % — AB (ref 39.0–52.0)
HEMOGLOBIN: 12 g/dL — AB (ref 13.0–17.0)
MCH: 30.9 pg (ref 26.0–34.0)
MCHC: 32.3 g/dL (ref 30.0–36.0)
MCV: 95.9 fL (ref 78.0–100.0)
Platelets: 102 10*3/uL — ABNORMAL LOW (ref 150–400)
RBC: 3.88 MIL/uL — AB (ref 4.22–5.81)
RDW: 13.3 % (ref 11.5–15.5)
WBC: 4.8 10*3/uL (ref 4.0–10.5)

## 2015-02-21 LAB — COMPREHENSIVE METABOLIC PANEL
ALBUMIN: 3.8 g/dL (ref 3.5–5.0)
ALK PHOS: 74 U/L (ref 38–126)
ALT: 21 U/L (ref 17–63)
ANION GAP: 8 (ref 5–15)
AST: 28 U/L (ref 15–41)
BILIRUBIN TOTAL: 0.6 mg/dL (ref 0.3–1.2)
BUN: 40 mg/dL — AB (ref 6–20)
CALCIUM: 9.4 mg/dL (ref 8.9–10.3)
CO2: 27 mmol/L (ref 22–32)
CREATININE: 1.97 mg/dL — AB (ref 0.61–1.24)
Chloride: 104 mmol/L (ref 101–111)
GFR calc Af Amer: 37 mL/min — ABNORMAL LOW (ref >60)
GFR calc non Af Amer: 32 mL/min — ABNORMAL LOW (ref >60)
GLUCOSE: 269 mg/dL — AB (ref 65–99)
Potassium: 4.9 mmol/L (ref 3.5–5.1)
SODIUM: 139 mmol/L (ref 135–145)
TOTAL PROTEIN: 7.6 g/dL (ref 6.5–8.1)

## 2015-02-21 LAB — TYPE AND SCREEN
ABO/RH(D): O POS
Antibody Screen: NEGATIVE

## 2015-02-21 LAB — URINE MICROSCOPIC-ADD ON

## 2015-02-21 LAB — GLUCOSE, CAPILLARY: Glucose-Capillary: 253 mg/dL — ABNORMAL HIGH (ref 65–99)

## 2015-02-21 LAB — PROTIME-INR
INR: 1.13 (ref 0.00–1.49)
Prothrombin Time: 14.7 seconds (ref 11.6–15.2)

## 2015-02-21 LAB — SURGICAL PCR SCREEN
MRSA, PCR: NEGATIVE
STAPHYLOCOCCUS AUREUS: POSITIVE — AB

## 2015-02-21 LAB — ABO/RH: ABO/RH(D): O POS

## 2015-02-21 LAB — APTT: APTT: 30 s (ref 24–37)

## 2015-02-21 NOTE — Pre-Procedure Instructions (Signed)
Ethan Vargas  02/21/2015      [facility] - [place] - [address] [place] [address] Phone: [phone] Fax: [phone]  [facility] - [place] [address] [place] [address] Phone: [phone] Fax: [phone]    Your procedure is scheduled on Friday 02/25/2015  Report to [facility] Admitting at 0530  Call this number if you have problems the morning of surgery:  [phone]   Remember:  Do not eat food or drink liquids after midnight.  Take these medicines the morning of surgery with A SIP OF WATER COREG, EYE DROPS, CARDURA, IMDUR, SINGULAIR, PAXIL,  How to Manage Your Diabetes Before Surgery   Why is it important to control my blood sugar before and after surgery?   Improving blood sugar levels before and after surgery helps healing and can limit problems.  A way of improving blood sugar control is eating a healthy diet by:  - Eating less sugar and carbohydrates  - Increasing activity/exercise  - Talk with your doctor about reaching your blood sugar goals  High blood sugars (greater than 180 mg/dL) can raise your risk of infections and slow down your recovery so you will need to focus on controlling your diabetes during the weeks before surgery.  Make sure that the doctor who takes care of your diabetes knows about your planned surgery including the date and location.  How do I manage my blood sugars before surgery?   Check your blood sugar at least 4 times a day, 2 days before surgery to make sure that they are not too high or low.   Check your blood sugar the morning of your surgery when you wake up and every 2               hours until you get to the Short-Stay unit.  If your blood sugar is less than 70 mg/dL, you will need to treat for low blood sugar by:  Treat a low blood sugar (less than 70 mg/dL) with 1/2 cup of  clear juice (cranberry or apple), 4 glucose tablets, OR glucose gel.  Recheck blood sugar in 15 minutes after treatment (to make sure it is greater than 70 mg/dL).  If blood sugar is not greater than 70 mg/dL on re-check, call [phone] for further instructions.   Report your blood sugar to the Short-Stay nurse when you get to Short-Stay.  References:  [facility], 2007 "How to Manage your Diabetes Before and After Surgery".  What do I do about my diabetes medications?   Do not take oral diabetes medicines (pills) the morning of surgery.    THE MORNING OF SURGERY, take 15 units of  LEVEMIR  Insulin.    Do not take other diabetes injectables the day of surgery including Byetta, Victoza, Bydureon, and Trulicity.    If your CBG is greater than 220 mg/dL, you may take 1/2 of your sliding scale (correction) dose of insulin.   For patients with "Insulin Pumps":  Contact your diabetes doctor for specific instructions before surgery.   Decrease basal insulin rates by 20% at midnight the night before surgery.  Note that if your surgery is planned to be longer than 2 hours, your insulin pump will be removed and intravenous (IV) insulin will be started and managed by the nurses and anesthesiologist.  You will be able  to restart your insulin pump once you are awake and able to manage it.  Make sure to bring insulin pump supplies to the hospital with you in case your site needs to be changed.        Do not wear jewelry, make-up or nail polish.  Do not wear lotions, powders, or perfumes.  You may wear deodorant.  Do not shave 48 hours prior to surgery.  Men may shave face and neck.  Do not bring valuables to the hospital.  [facility] is not responsible for any belongings or valuables.  Contacts, dentures or bridgework may not be worn into surgery.  Leave your suitcase in the car.  After surgery it may be brought to your room.  For patients admitted to  the hospital, discharge time will be determined by your treatment team.  Patients discharged the day of surgery will not be allowed to drive home.   Name and phone number of your driver:  TBA Special instructions:  [facility] - Preparing for Surgery  Before surgery, you can play an important role.  Because skin is not sterile, your skin needs to be as free of germs as possible.  You can reduce the number of germs on you skin by washing with CHG (chlorahexidine gluconate) soap before surgery.  CHG is an antiseptic cleaner which kills germs and bonds with the skin to continue killing germs even after washing.  Please DO NOT use if you have an allergy to CHG or antibacterial soaps.  If your skin becomes reddened/irritated stop using the CHG and inform your nurse when you arrive at Short Stay.  Do not shave (including legs and underarms) for at least 48 hours prior to the first CHG shower.  You may shave your face.  Please follow these instructions carefully:   1.  Shower with CHG Soap the night before surgery and the                                morning of Surgery.  2.  If you choose to wash your hair, wash your hair first as usual with your       normal shampoo.  3.  After you shampoo, rinse your hair and body thoroughly to remove the                      Shampoo.  4.  Use CHG as you would any other liquid soap.  You can apply chg directly       to the skin and wash gently with scrungie or a clean washcloth.  5.  Apply the CHG Soap to your body ONLY FROM THE NECK DOWN.        Do not use on open wounds or open sores.  Avoid contact with your eyes,       ears, mouth and genitals (private parts).  Wash genitals (private parts)       with your normal soap.  6.  Wash thoroughly, paying special attention to the area where your surgery        will be performed.  7.  Thoroughly rinse your body with warm water from the neck down.  8.  DO NOT shower/wash with your normal soap after using and rinsing off        the CHG Soap.  9.  Pat yourself dry with a clean towel.  10.  Wear clean pajamas.            11.  Place clean sheets on your bed the night of your first shower and do not        sleep with pets.  Day of Surgery  Do not apply any lotions/deoderants the morning of surgery.  Please wear clean clothes to the hospital/surgery center.    Please read over the following fact sheets that you were given. Pain Booklet, Coughing and Deep Breathing, Blood Transfusion Information, MRSA Information and Surgical Site Infection Prevention

## 2015-02-22 NOTE — Progress Notes (Addendum)
Anesthesia Chart Review: Patient is a 74 year old male scheduled for right CEA on 02/25/15 by Dr. Myra Gianotti.  History includes chronic systolic CHF, CAD s/p CABG '98 with inferior MI '03 s/p DES to SVG-RCA (occluded with collaterals in 2010), s/p DES to SVG-D1 '10, right carotid occlusive disease, DM2 with neuropathy and nephropathy, non-smoker, post-operative N/V, OSA (no CPAP use due to issues with bronchitis while he was using it), anemia of chronic disease, Barrett's esophagus with reflux, HLD, renal artery stenosis s/p stent '07, exertional dyspnea. PCP is Dr. Lupita Raider.   Cardiologist is Dr. Verdis Prime, last visit 10/28/14. Volume status was felt stable at that time. No angina.  He referred patient to cardiologist Dr. Kirke Corin for evaluation of PVD following LE vascular studies with ABI. Carotid duplex done which revealed severe RICA stenosis. He was then referred to cardiologist Dr. Allyson Sabal who said patient was excluded from the Crest 2 study because of renal insufficiency, so he referred patient to Dr. Myra Gianotti for consideration of right CEA.  Of note, patient was seen by EP cardiologist Dr. Johney Frame in 07/2013 following a stress test revealing a drop in his EF to 28% (down from 40-45% in 2011).  Patient favored medical therapy over ICD insertion. He was started on low dose Coreg (had previously not tolerated other beta blockers). EF improved to 40-45% by echo in 02/2014. He has also seen cardiologist Dr. Armanda Magic for OSA with right heart failure. Split night study ordered, but not done.    Meds include ASA 81 mg, Coreg, Cardura, 65 Fe, Flonase, Lasix, Imdur, Xalatan, Levemir, lisinopril, Singulair, Nitro, Paxil, K-dur, Crestor, Aldactone, Tradjenta, testosterone injection.   02/14/15 Right Carotid duplex: 80-99% RICA stenosis. 12/16/14 Carotid duplex: 80-99% RICA stenosis. 1-39% LICA stenosis. Normal SCA's bilaterally. Patent vertebral arteries with antegrade flow.   03/18/14 Echo: Study Conclusions -  Left ventricle: The cavity size was mildly dilated. Wall thickness was increased in a pattern of mild LVH. Systolic function was mildly to moderately reduced. The estimated ejection fraction was in the range of 40% to 45% with severe hypokinesis of inferior wall (base, mid; mild hypokinesis elsewhere. Featuresare consistent with a pseudonormal left ventricular fillingpattern, with concomitant abnormal relaxation and increased filling pressure (grade 2 diastolic dysfunction). - Aortic valve: There was mild to moderate regurgitation. - Mitral valve: There was mild regurgitation. - Left atrium: The atrium was moderately dilated. - Right ventricle: Systolic function was moderately to severely reduced. (Results reviewed by Dr. Verdis Prime, "New finding is right heart failure probably related to Sleep apnea and Left heart failure. So we treat sleep apnea with CPAP and Left heart failure with meds.")  08/04/13 Nuclear stress test: Overall Impression: Intermediate risk stress nuclear study. There is a medium size defect of moderate severity affecting the apical anterior, mid anterior and mid anteroseptal segments. There is global hypokinesis. LV Ejection Fraction: 28%. LV Wall Motion: Global hypokinesis. (Dr. Katrinka Blazing recommended EP referral for consideration of ICD. Patient was seen by Dr. Johney Frame. Patient opted for medical therapy over ICD. EF 40-45% on follow-up echo 02/2014.)  03/03/10 Cardiac cath:  CONCLUSION:  1. 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 The saphenous vein graft to the obtuse marginal is widely patent.  2. Widely patent LIMA to the LAD.  3. Severe native vessel coronary  disease with total occlusion of the mid LAD, the circumflex, and the RCA.  4. Left ventricular dysfunction with inferoapical  akinesis/aneurysm and estimated ejection fraction of 40-45%.  Plan: Monitor clinically. If he continues to have angina, we will consider stenting the distal portion of the SVG-DIAG. Aggressive risk factor modification. It is okay for the patient to return to exercise.  06/29/14 EKG: SR with frequent PVCs, LVH with QRS widening and repolarization abnormality.   Preoperative labs noted. BUN 40, Cr 1.97, appears stable since at least 02/2014. Non-fasting glucose of 269. Unfortunately, A1C was not done. I called Dr. Alver Fisher office. Last A1C was 7.4 on 03/16/14.   I called to speak with him regarding DM control and cardiopulmonary status. He denied any new CV symptoms since his 10/2014 cardiology visit with Dr. Katrinka Blazing. Weight has been stable. He has not been monitoring his CBG (machine is broken) for some time now. He says he can get his machine fixed or buy a new one by tomorrow. I asked that he call me to give me his fasting CBG result. I advised that his fasting glucose on the day of surgery should be 200 or less and anything significantly over that could delay or cancel his procedure. I will forward CMET results to Dr. Myra Gianotti.   Above reviewed with anesthesiologist Dr. Chaney Malling. If no acute cardiopulmonary issues and glucose is acceptable on the day of surgery then it is anticipated that he can proceed as planned.  Velna Ochs [facility] Short Stay Center/Anesthesiology Phone [phone] 02/22/2015 4:38 PM  Addendum: Patient called this morning. His fasting glucose was 119.  Velna Ochs [facility] Short Stay Center/Anesthesiology Phone [phone] 02/23/2015 11:28 AM

## 2015-02-24 MED ORDER — CHLORHEXIDINE GLUCONATE CLOTH 2 % EX PADS: 6.0000 | MEDICATED_PAD | Freq: Once | CUTANEOUS | Status: DC

## 2015-02-24 MED ORDER — DEXTROSE 5 % IV SOLN
1.5000 g | INTRAVENOUS | Status: AC
  Administered 2015-02-25: 1.5 g via INTRAVENOUS
  Filled 2015-02-24: qty 1.5

## 2015-02-24 MED ORDER — SODIUM CHLORIDE 0.9 % IV SOLN: INTRAVENOUS | Status: DC

## 2015-02-25 ENCOUNTER — Encounter (HOSPITAL_COMMUNITY)
Admission: RE | Disposition: A | Discharge status: Home / Self Care | Payer: Self-pay | Source: Ambulatory Visit | Attending: Surgery

## 2015-02-25 ENCOUNTER — Inpatient Hospital Stay (HOSPITAL_COMMUNITY): Payer: Managed Care, Other (non HMO) | Admitting: Vascular Surgery

## 2015-02-25 ENCOUNTER — Inpatient Hospital Stay (HOSPITAL_COMMUNITY)
Admission: RE | Admit: 2015-02-25 | Discharge: 2015-03-01 | DRG: 038 | Disposition: A | Discharge status: Home / Self Care | Payer: Managed Care, Other (non HMO) | Source: Ambulatory Visit | Attending: Surgery | Admitting: Surgery

## 2015-02-25 ENCOUNTER — Inpatient Hospital Stay (HOSPITAL_COMMUNITY): Payer: Managed Care, Other (non HMO) | Admitting: Anesthesiology

## 2015-02-25 ENCOUNTER — Encounter (HOSPITAL_COMMUNITY): Payer: Self-pay | Admitting: *Deleted

## 2015-02-25 DIAGNOSIS — Z79899 Other long term (current) drug therapy: Secondary | ICD-10-CM

## 2015-02-25 DIAGNOSIS — N183 Chronic kidney disease, stage 3 (moderate): Secondary | ICD-10-CM | POA: Diagnosis present

## 2015-02-25 DIAGNOSIS — E1122 Type 2 diabetes mellitus with diabetic chronic kidney disease: Secondary | ICD-10-CM | POA: Diagnosis present

## 2015-02-25 DIAGNOSIS — Z7982 Long term (current) use of aspirin: Secondary | ICD-10-CM

## 2015-02-25 DIAGNOSIS — E785 Hyperlipidemia, unspecified: Secondary | ICD-10-CM | POA: Diagnosis present

## 2015-02-25 DIAGNOSIS — I130 Hypertensive heart and chronic kidney disease with heart failure and stage 1 through stage 4 chronic kidney disease, or unspecified chronic kidney disease: Secondary | ICD-10-CM | POA: Diagnosis present

## 2015-02-25 DIAGNOSIS — I6521 Occlusion and stenosis of right carotid artery: Secondary | ICD-10-CM | POA: Diagnosis present

## 2015-02-25 DIAGNOSIS — G4730 Sleep apnea, unspecified: Secondary | ICD-10-CM | POA: Diagnosis present

## 2015-02-25 DIAGNOSIS — G510 Bell's palsy: Secondary | ICD-10-CM | POA: Diagnosis not present

## 2015-02-25 DIAGNOSIS — I252 Old myocardial infarction: Secondary | ICD-10-CM

## 2015-02-25 DIAGNOSIS — I5022 Chronic systolic (congestive) heart failure: Secondary | ICD-10-CM | POA: Diagnosis present

## 2015-02-25 DIAGNOSIS — Z955 Presence of coronary angioplasty implant and graft: Secondary | ICD-10-CM

## 2015-02-25 DIAGNOSIS — E875 Hyperkalemia: Secondary | ICD-10-CM | POA: Diagnosis not present

## 2015-02-25 DIAGNOSIS — E1140 Type 2 diabetes mellitus with diabetic neuropathy, unspecified: Secondary | ICD-10-CM | POA: Diagnosis present

## 2015-02-25 DIAGNOSIS — I2510 Atherosclerotic heart disease of native coronary artery without angina pectoris: Secondary | ICD-10-CM | POA: Diagnosis present

## 2015-02-25 DIAGNOSIS — Z951 Presence of aortocoronary bypass graft: Secondary | ICD-10-CM | POA: Diagnosis not present

## 2015-02-25 DIAGNOSIS — Z7984 Long term (current) use of oral hypoglycemic drugs: Secondary | ICD-10-CM

## 2015-02-25 DIAGNOSIS — Z7951 Long term (current) use of inhaled steroids: Secondary | ICD-10-CM

## 2015-02-25 DIAGNOSIS — I255 Ischemic cardiomyopathy: Secondary | ICD-10-CM | POA: Diagnosis present

## 2015-02-25 DIAGNOSIS — I6529 Occlusion and stenosis of unspecified carotid artery: Secondary | ICD-10-CM | POA: Diagnosis present

## 2015-02-25 DIAGNOSIS — D638 Anemia in other chronic diseases classified elsewhere: Secondary | ICD-10-CM | POA: Diagnosis present

## 2015-02-25 HISTORY — PX: ENDARTERECTOMY: SHX5162

## 2015-02-25 HISTORY — PX: PATCH ANGIOPLASTY: SHX6230

## 2015-02-25 HISTORY — PX: CAROTID ENDARTERECTOMY: SUR193

## 2015-02-25 LAB — CREATININE, SERUM
Creatinine, Ser: 2.29 mg/dL — ABNORMAL HIGH (ref 0.61–1.24)
GFR calc non Af Amer: 27 mL/min — ABNORMAL LOW (ref >60)
GFR, EST AFRICAN AMERICAN: 31 mL/min — AB (ref >60)

## 2015-02-25 LAB — GLUCOSE, CAPILLARY
GLUCOSE-CAPILLARY: 137 mg/dL — AB (ref 65–99)
GLUCOSE-CAPILLARY: 185 mg/dL — AB (ref 65–99)
Glucose-Capillary: 145 mg/dL — ABNORMAL HIGH (ref 65–99)

## 2015-02-25 LAB — CBC
HEMATOCRIT: 34.2 % — AB (ref 39.0–52.0)
Hemoglobin: 11.2 g/dL — ABNORMAL LOW (ref 13.0–17.0)
MCH: 31.1 pg (ref 26.0–34.0)
MCHC: 32.7 g/dL (ref 30.0–36.0)
MCV: 95 fL (ref 78.0–100.0)
Platelets: 76 10*3/uL — ABNORMAL LOW (ref 150–400)
RBC: 3.6 MIL/uL — AB (ref 4.22–5.81)
RDW: 13.3 % (ref 11.5–15.5)
WBC: 6.3 10*3/uL (ref 4.0–10.5)

## 2015-02-25 SURGERY — ENDARTERECTOMY, CAROTID
Anesthesia: General | Site: Neck | Laterality: Right

## 2015-02-25 MED ORDER — FERROUS SULFATE 325 (65 FE) MG PO TABS
325.0000 mg | ORAL_TABLET | Freq: Every day | ORAL | Status: DC
  Administered 2015-02-26 – 2015-03-01 (×4): 325 mg via ORAL
  Filled 2015-02-25 (×5): qty 1

## 2015-02-25 MED ORDER — FENTANYL CITRATE (PF) 100 MCG/2ML IJ SOLN: 25.0000 ug | INTRAMUSCULAR | Status: DC | PRN

## 2015-02-25 MED ORDER — SODIUM CHLORIDE 0.9 % IV SOLN
INTRAVENOUS | Status: DC | PRN
  Administered 2015-02-25: 500 mL

## 2015-02-25 MED ORDER — LIDOCAINE HCL (CARDIAC) 20 MG/ML IV SOLN
INTRAVENOUS | Status: AC
  Filled 2015-02-25: qty 5

## 2015-02-25 MED ORDER — OXYCODONE-ACETAMINOPHEN 5-325 MG PO TABS: 1.0000 | ORAL_TABLET | ORAL | Status: DC | PRN

## 2015-02-25 MED ORDER — LABETALOL HCL 5 MG/ML IV SOLN
10.0000 mg | INTRAVENOUS | Status: DC | PRN
  Administered 2015-02-25 (×2): 10 mg via INTRAVENOUS
  Filled 2015-02-25 (×3): qty 4

## 2015-02-25 MED ORDER — PROTAMINE SULFATE 10 MG/ML IV SOLN
INTRAVENOUS | Status: DC | PRN
  Administered 2015-02-25: 10 mg via INTRAVENOUS
  Administered 2015-02-25: 40 mg via INTRAVENOUS

## 2015-02-25 MED ORDER — PROPOFOL 10 MG/ML IV BOLUS
INTRAVENOUS | Status: DC | PRN
  Administered 2015-02-25: 150 mg via INTRAVENOUS

## 2015-02-25 MED ORDER — PROPOFOL 10 MG/ML IV BOLUS
INTRAVENOUS | Status: AC
  Filled 2015-02-25: qty 20

## 2015-02-25 MED ORDER — MORPHINE SULFATE (PF) 2 MG/ML IV SOLN: 2.0000 mg | INTRAVENOUS | Status: DC | PRN

## 2015-02-25 MED ORDER — CARVEDILOL 12.5 MG PO TABS
12.5000 mg | ORAL_TABLET | Freq: Two times a day (BID) | ORAL | Status: DC
  Administered 2015-02-26 – 2015-03-01 (×7): 12.5 mg via ORAL
  Filled 2015-02-25 (×9): qty 1

## 2015-02-25 MED ORDER — DOCUSATE SODIUM 100 MG PO CAPS
100.0000 mg | ORAL_CAPSULE | Freq: Every day | ORAL | Status: DC
  Administered 2015-02-26 – 2015-03-01 (×2): 100 mg via ORAL
  Filled 2015-02-25 (×3): qty 1

## 2015-02-25 MED ORDER — DOPAMINE-DEXTROSE 3.2-5 MG/ML-% IV SOLN: 3.0000 ug/kg/min | INTRAVENOUS | Status: DC | PRN

## 2015-02-25 MED ORDER — SODIUM CHLORIDE 0.9 % IV SOLN
500.0000 mL | Freq: Once | INTRAVENOUS | Status: DC | PRN
Start: 2015-02-25 — End: 2015-02-26

## 2015-02-25 MED ORDER — ROCURONIUM BROMIDE 100 MG/10ML IV SOLN
INTRAVENOUS | Status: DC | PRN
  Administered 2015-02-25: 40 mg via INTRAVENOUS

## 2015-02-25 MED ORDER — POTASSIUM CHLORIDE ER 10 MEQ PO TBCR
10.0000 meq | EXTENDED_RELEASE_TABLET | Freq: Every day | ORAL | Status: DC
  Filled 2015-02-25 (×2): qty 1

## 2015-02-25 MED ORDER — LINAGLIPTIN 5 MG PO TABS
5.0000 mg | ORAL_TABLET | Freq: Every day | ORAL | Status: DC
  Administered 2015-02-27 – 2015-03-01 (×3): 5 mg via ORAL
  Filled 2015-02-25 (×5): qty 1

## 2015-02-25 MED ORDER — ONDANSETRON HCL 4 MG/2ML IJ SOLN: 4.0000 mg | Freq: Once | INTRAMUSCULAR | Status: DC | PRN

## 2015-02-25 MED ORDER — OXYCODONE-ACETAMINOPHEN 5-325 MG PO TABS: 1.0000 | ORAL_TABLET | Freq: Four times a day (QID) | ORAL | Status: DC | PRN

## 2015-02-25 MED ORDER — POTASSIUM CHLORIDE CRYS ER 20 MEQ PO TBCR: 20.0000 meq | EXTENDED_RELEASE_TABLET | Freq: Every day | ORAL | Status: DC | PRN

## 2015-02-25 MED ORDER — DEXTROSE 5 % IV SOLN
10.0000 mg | INTRAVENOUS | Status: DC | PRN
  Administered 2015-02-25: 25 ug/min via INTRAVENOUS

## 2015-02-25 MED ORDER — LIDOCAINE HCL (CARDIAC) 20 MG/ML IV SOLN
INTRAVENOUS | Status: DC | PRN
  Administered 2015-02-25: 100 mg via INTRAVENOUS

## 2015-02-25 MED ORDER — METOPROLOL TARTRATE 1 MG/ML IV SOLN: 2.0000 mg | INTRAVENOUS | Status: DC | PRN

## 2015-02-25 MED ORDER — MIDAZOLAM HCL 2 MG/2ML IJ SOLN
INTRAMUSCULAR | Status: AC
  Filled 2015-02-25: qty 4

## 2015-02-25 MED ORDER — MAGNESIUM SULFATE 2 GM/50ML IV SOLN
2.0000 g | Freq: Every day | INTRAVENOUS | Status: DC | PRN
  Filled 2015-02-25: qty 50

## 2015-02-25 MED ORDER — ACETAMINOPHEN 325 MG PO TABS
325.0000 mg | ORAL_TABLET | Freq: Two times a day (BID) | ORAL | Status: DC
  Administered 2015-02-26 – 2015-03-01 (×7): 325 mg via ORAL
  Filled 2015-02-25 (×7): qty 1

## 2015-02-25 MED ORDER — ASPIRIN EC 81 MG PO TBEC
81.0000 mg | DELAYED_RELEASE_TABLET | Freq: Every day | ORAL | Status: DC
  Administered 2015-02-27 – 2015-03-01 (×3): 81 mg via ORAL
  Filled 2015-02-25 (×5): qty 1

## 2015-02-25 MED ORDER — PAROXETINE HCL 20 MG PO TABS
20.0000 mg | ORAL_TABLET | Freq: Every day | ORAL | Status: DC
  Administered 2015-02-26 – 2015-03-01 (×4): 20 mg via ORAL
  Filled 2015-02-25 (×5): qty 1

## 2015-02-25 MED ORDER — PANTOPRAZOLE SODIUM 40 MG PO TBEC
40.0000 mg | DELAYED_RELEASE_TABLET | Freq: Every day | ORAL | Status: DC
  Administered 2015-02-26 – 2015-03-01 (×3): 40 mg via ORAL
  Filled 2015-02-25 (×4): qty 1

## 2015-02-25 MED ORDER — MIDAZOLAM HCL 5 MG/5ML IJ SOLN
INTRAMUSCULAR | Status: DC | PRN
  Administered 2015-02-25: 1 mg via INTRAVENOUS

## 2015-02-25 MED ORDER — HYDRALAZINE HCL 20 MG/ML IJ SOLN
5.0000 mg | INTRAMUSCULAR | Status: AC | PRN
  Administered 2015-02-25 (×2): 5 mg via INTRAVENOUS
  Filled 2015-02-25 (×3): qty 0.25

## 2015-02-25 MED ORDER — ACETAMINOPHEN 650 MG RE SUPP: 325.0000 mg | RECTAL | Status: DC | PRN

## 2015-02-25 MED ORDER — LACTATED RINGERS IV SOLN
INTRAVENOUS | Status: DC | PRN
Start: 2015-02-25 — End: 2015-02-25
  Administered 2015-02-25 (×2): via INTRAVENOUS

## 2015-02-25 MED ORDER — ONDANSETRON HCL 4 MG/2ML IJ SOLN: 4.0000 mg | Freq: Four times a day (QID) | INTRAMUSCULAR | Status: DC | PRN

## 2015-02-25 MED ORDER — HEMOSTATIC AGENTS (NO CHARGE) OPTIME
TOPICAL | Status: DC | PRN
  Administered 2015-02-25: 1 via TOPICAL

## 2015-02-25 MED ORDER — ROSUVASTATIN CALCIUM 10 MG PO TABS
10.0000 mg | ORAL_TABLET | Freq: Every day | ORAL | Status: DC
  Administered 2015-02-26 – 2015-03-01 (×4): 10 mg via ORAL
  Filled 2015-02-25 (×5): qty 1

## 2015-02-25 MED ORDER — MONTELUKAST SODIUM 10 MG PO TABS
10.0000 mg | ORAL_TABLET | Freq: Every day | ORAL | Status: DC
  Administered 2015-02-27 – 2015-03-01 (×3): 10 mg via ORAL
  Filled 2015-02-25 (×6): qty 1

## 2015-02-25 MED ORDER — LABETALOL HCL 5 MG/ML IV SOLN
INTRAVENOUS | Status: AC
  Administered 2015-02-25: 10 mg via INTRAVENOUS
  Filled 2015-02-25: qty 4

## 2015-02-25 MED ORDER — NEOSTIGMINE METHYLSULFATE 10 MG/10ML IV SOLN
INTRAVENOUS | Status: DC | PRN
  Administered 2015-02-25: 3 mg via INTRAVENOUS

## 2015-02-25 MED ORDER — BISACODYL 10 MG RE SUPP: 10.0000 mg | Freq: Every day | RECTAL | Status: DC | PRN

## 2015-02-25 MED ORDER — HYDRALAZINE HCL 20 MG/ML IJ SOLN
INTRAMUSCULAR | Status: AC
  Administered 2015-02-25: 5 mg via INTRAVENOUS
  Filled 2015-02-25: qty 1

## 2015-02-25 MED ORDER — SPIRONOLACTONE 12.5 MG HALF TABLET
12.5000 mg | ORAL_TABLET | Freq: Every day | ORAL | Status: DC
  Administered 2015-02-26: 12.5 mg via ORAL
  Filled 2015-02-25 (×2): qty 1

## 2015-02-25 MED ORDER — HEPARIN SODIUM (PORCINE) 1000 UNIT/ML IJ SOLN
INTRAMUSCULAR | Status: DC | PRN
  Administered 2015-02-25: 1000 [IU] via INTRAVENOUS
  Administered 2015-02-25: 7000 [IU] via INTRAVENOUS

## 2015-02-25 MED ORDER — LIDOCAINE HCL (PF) 1 % IJ SOLN
INTRAMUSCULAR | Status: AC
  Filled 2015-02-25: qty 30

## 2015-02-25 MED ORDER — SODIUM CHLORIDE 0.9 % IV SOLN
INTRAVENOUS | Status: DC
  Administered 2015-02-25: 75 mL/h via INTRAVENOUS

## 2015-02-25 MED ORDER — ISOSORBIDE MONONITRATE ER 30 MG PO TB24
15.0000 mg | ORAL_TABLET | Freq: Every day | ORAL | Status: DC
  Administered 2015-02-26 – 2015-03-01 (×4): 15 mg via ORAL
  Filled 2015-02-25 (×5): qty 1

## 2015-02-25 MED ORDER — LISINOPRIL 20 MG PO TABS
20.0000 mg | ORAL_TABLET | Freq: Every day | ORAL | Status: DC
  Administered 2015-02-26: 20 mg via ORAL
  Filled 2015-02-25 (×2): qty 1

## 2015-02-25 MED ORDER — FLUTICASONE PROPIONATE 50 MCG/ACT NA SUSP
1.0000 | Freq: Two times a day (BID) | NASAL | Status: DC
  Administered 2015-02-26 – 2015-03-01 (×2): 1 via NASAL
  Filled 2015-02-25: qty 16

## 2015-02-25 MED ORDER — DEXTROSE 5 % IV SOLN
1.5000 g | Freq: Two times a day (BID) | INTRAVENOUS | Status: AC
  Administered 2015-02-25 – 2015-02-26 (×2): 1.5 g via INTRAVENOUS
  Filled 2015-02-25 (×2): qty 1.5

## 2015-02-25 MED ORDER — EPHEDRINE SULFATE 50 MG/ML IJ SOLN
INTRAMUSCULAR | Status: DC | PRN
  Administered 2015-02-25: 10 mg via INTRAVENOUS
  Administered 2015-02-25 (×5): 5 mg via INTRAVENOUS

## 2015-02-25 MED ORDER — FENTANYL CITRATE (PF) 250 MCG/5ML IJ SOLN
INTRAMUSCULAR | Status: AC
  Filled 2015-02-25: qty 5

## 2015-02-25 MED ORDER — LATANOPROST 0.005 % OP SOLN
1.0000 [drp] | Freq: Every day | OPHTHALMIC | Status: DC
  Administered 2015-02-26 – 2015-02-28 (×3): 1 [drp] via OPHTHALMIC
  Filled 2015-02-25: qty 2.5

## 2015-02-25 MED ORDER — ROCURONIUM BROMIDE 50 MG/5ML IV SOLN
INTRAVENOUS | Status: AC
  Filled 2015-02-25: qty 1

## 2015-02-25 MED ORDER — LABETALOL HCL 5 MG/ML IV SOLN
INTRAVENOUS | Status: DC | PRN
  Administered 2015-02-25: 10 mg via INTRAVENOUS
  Administered 2015-02-25 (×2): 5 mg via INTRAVENOUS

## 2015-02-25 MED ORDER — ALUM & MAG HYDROXIDE-SIMETH 200-200-20 MG/5ML PO SUSP: 15.0000 mL | ORAL | Status: DC | PRN

## 2015-02-25 MED ORDER — ONDANSETRON HCL 4 MG/2ML IJ SOLN
INTRAMUSCULAR | Status: AC
  Filled 2015-02-25: qty 2

## 2015-02-25 MED ORDER — HEPARIN SODIUM (PORCINE) 5000 UNIT/ML IJ SOLN
5000.0000 [IU] | Freq: Three times a day (TID) | INTRAMUSCULAR | Status: DC
Start: 2015-02-26 — End: 2015-03-01
  Administered 2015-02-26 – 2015-03-01 (×9): 5000 [IU] via SUBCUTANEOUS
  Filled 2015-02-25 (×12): qty 1

## 2015-02-25 MED ORDER — PHENOL 1.4 % MT LIQD: 1.0000 | OROMUCOSAL | Status: DC | PRN

## 2015-02-25 MED ORDER — ACETAMINOPHEN 325 MG PO TABS: 325.0000 mg | ORAL_TABLET | ORAL | Status: DC | PRN

## 2015-02-25 MED ORDER — GUAIFENESIN-DM 100-10 MG/5ML PO SYRP
15.0000 mL | ORAL_SOLUTION | ORAL | Status: DC | PRN
Start: 2015-02-25 — End: 2015-03-01

## 2015-02-25 MED ORDER — ONDANSETRON HCL 4 MG/2ML IJ SOLN
INTRAMUSCULAR | Status: DC | PRN
  Administered 2015-02-25: 4 mg via INTRAVENOUS

## 2015-02-25 MED ORDER — VITAMIN D 1000 UNITS PO TABS
1000.0000 [IU] | ORAL_TABLET | Freq: Every day | ORAL | Status: DC
  Administered 2015-02-27 – 2015-03-01 (×3): 1000 [IU] via ORAL
  Filled 2015-02-25 (×5): qty 1

## 2015-02-25 MED ORDER — 0.9 % SODIUM CHLORIDE (POUR BTL) OPTIME
TOPICAL | Status: DC | PRN
Start: 2015-02-25 — End: 2015-02-25
  Administered 2015-02-25: 2000 mL

## 2015-02-25 MED ORDER — FUROSEMIDE 80 MG PO TABS
80.0000 mg | ORAL_TABLET | Freq: Every day | ORAL | Status: DC
  Administered 2015-02-26: 80 mg via ORAL
  Filled 2015-02-25 (×3): qty 1

## 2015-02-25 MED ORDER — INSULIN ASPART 100 UNIT/ML ~~LOC~~ SOLN
0.0000 [IU] | Freq: Three times a day (TID) | SUBCUTANEOUS | Status: DC
  Administered 2015-02-25 – 2015-02-26 (×2): 3 [IU] via SUBCUTANEOUS
  Administered 2015-02-26: 8 [IU] via SUBCUTANEOUS
  Administered 2015-02-26: 3 [IU] via SUBCUTANEOUS
  Administered 2015-02-27: 5 [IU] via SUBCUTANEOUS
  Administered 2015-02-27: 3 [IU] via SUBCUTANEOUS
  Administered 2015-02-27: 5 [IU] via SUBCUTANEOUS
  Administered 2015-02-28: 8 [IU] via SUBCUTANEOUS
  Administered 2015-02-28 (×2): 5 [IU] via SUBCUTANEOUS
  Administered 2015-03-01: 3 [IU] via SUBCUTANEOUS

## 2015-02-25 MED ORDER — GLYCOPYRROLATE 0.2 MG/ML IJ SOLN
INTRAMUSCULAR | Status: DC | PRN
  Administered 2015-02-25: .4 mg via INTRAVENOUS

## 2015-02-25 MED ORDER — DOXAZOSIN MESYLATE 2 MG PO TABS
2.0000 mg | ORAL_TABLET | Freq: Every day | ORAL | Status: DC
  Administered 2015-02-27 – 2015-03-01 (×3): 2 mg via ORAL
  Filled 2015-02-25 (×6): qty 1

## 2015-02-25 MED ORDER — FENTANYL CITRATE (PF) 100 MCG/2ML IJ SOLN
INTRAMUSCULAR | Status: DC | PRN
  Administered 2015-02-25 (×2): 50 ug via INTRAVENOUS
  Administered 2015-02-25: 100 ug via INTRAVENOUS

## 2015-02-25 SURGICAL SUPPLY — 50 items
CANISTER SUCTION 2500CC (MISCELLANEOUS) ×3 IMPLANT
CATH ROBINSON RED A/P 18FR (CATHETERS) ×3 IMPLANT
CATH SUCT 10FR WHISTLE TIP (CATHETERS) ×3 IMPLANT
CLIP TI MEDIUM 6 (CLIP) ×3 IMPLANT
CLIP TI WIDE RED SMALL 24 (CLIP) ×2 IMPLANT
CLIP TI WIDE RED SMALL 6 (CLIP) ×3 IMPLANT
CRADLE DONUT ADULT HEAD (MISCELLANEOUS) ×3 IMPLANT
DRAIN CHANNEL 15F RND FF W/TCR (WOUND CARE) IMPLANT
ELECT REM PT RETURN 9FT ADLT (ELECTROSURGICAL) ×3
ELECTRODE REM PT RTRN 9FT ADLT (ELECTROSURGICAL) ×1 IMPLANT
EVACUATOR SILICONE 100CC (DRAIN) IMPLANT
GAUZE SPONGE 4X4 12PLY STRL (GAUZE/BANDAGES/DRESSINGS) ×3 IMPLANT
GLOVE BIO SURGEON STRL SZ 6.5 (GLOVE) ×3 IMPLANT
GLOVE BIO SURGEONS STRL SZ 6.5 (GLOVE) ×3
GLOVE BIOGEL PI IND STRL 6.5 (GLOVE) IMPLANT
GLOVE BIOGEL PI IND STRL 7.5 (GLOVE) ×1 IMPLANT
GLOVE BIOGEL PI INDICATOR 6.5 (GLOVE) ×8
GLOVE BIOGEL PI INDICATOR 7.5 (GLOVE) ×2
GLOVE SURG SS PI 7.5 STRL IVOR (GLOVE) ×3 IMPLANT
GOWN STRL REUS W/ TWL LRG LVL3 (GOWN DISPOSABLE) ×2 IMPLANT
GOWN STRL REUS W/ TWL XL LVL3 (GOWN DISPOSABLE) ×1 IMPLANT
GOWN STRL REUS W/TWL LRG LVL3 (GOWN DISPOSABLE) ×9
GOWN STRL REUS W/TWL XL LVL3 (GOWN DISPOSABLE) ×3
HEMOSTAT SNOW SURGICEL 2X4 (HEMOSTASIS) ×2 IMPLANT
INSERT FOGARTY SM (MISCELLANEOUS) IMPLANT
KIT BASIN OR (CUSTOM PROCEDURE TRAY) ×3 IMPLANT
KIT ROOM TURNOVER OR (KITS) ×3 IMPLANT
LIQUID BAND (GAUZE/BANDAGES/DRESSINGS) ×3 IMPLANT
NDL HYPO 25GX1X1/2 BEV (NEEDLE) IMPLANT
NEEDLE HYPO 25GX1X1/2 BEV (NEEDLE) IMPLANT
NS IRRIG 1000ML POUR BTL (IV SOLUTION) ×9 IMPLANT
PACK CAROTID (CUSTOM PROCEDURE TRAY) ×3 IMPLANT
PAD ARMBOARD 7.5X6 YLW CONV (MISCELLANEOUS) ×6 IMPLANT
PATCH VASC XENOSURE 1CMX6CM (Vascular Products) ×3 IMPLANT
PATCH VASC XENOSURE 1X6 (Vascular Products) IMPLANT
SHUNT CAROTID BYPASS 10 (VASCULAR PRODUCTS) IMPLANT
SHUNT CAROTID BYPASS 12FRX15.5 (VASCULAR PRODUCTS) IMPLANT
SPONGE INTESTINAL PEANUT (DISPOSABLE) ×3 IMPLANT
SUT ETHILON 3 0 PS 1 (SUTURE) IMPLANT
SUT PROLENE 5 0 C 1 24 (SUTURE) ×2 IMPLANT
SUT PROLENE 6 0 BV (SUTURE) ×5 IMPLANT
SUT PROLENE 7 0 BV 1 (SUTURE) IMPLANT
SUT PROLENE 7 0 BV1 MDA (SUTURE) ×2 IMPLANT
SUT SILK 3 0 TIES 17X18 (SUTURE)
SUT SILK 3-0 18XBRD TIE BLK (SUTURE) IMPLANT
SUT VIC AB 3-0 SH 27 (SUTURE) ×6
SUT VIC AB 3-0 SH 27X BRD (SUTURE) ×2 IMPLANT
SUT VICRYL 4-0 PS2 18IN ABS (SUTURE) ×3 IMPLANT
SYR CONTROL 10ML LL (SYRINGE) IMPLANT
WATER STERILE IRR 1000ML POUR (IV SOLUTION) ×3 IMPLANT

## 2015-02-25 NOTE — Interval H&P Note (Signed)
History and Physical Interval Note:  02/25/2015 6:56 AM  Ethan Vargas  has presented today for surgery, with the diagnosis of Right carotid artery stenosis I65.21  The various methods of treatment have been discussed with the patient and family. After consideration of risks, benefits and other options for treatment, the patient has consented to  Procedure(s): ENDARTERECTOMY CAROTID (Right) as a surgical intervention .  The patient's history has been reviewed, patient examined, no change in status, stable for surgery.  I have reviewed the patient's chart and labs.  Questions were answered to the patient's satisfaction.     Durene Cal

## 2015-02-25 NOTE — Progress Notes (Addendum)
  [facility] Day of Surgery Note  Subjective:  Patient seen in PACU  Filed Vitals:   02/25/15 1430  BP: 158/69  Pulse: 86  Temp: 97.2 F (36.2 C)  Resp: 16    Right neck with mild swelling. Area is soft without palpable hematoma.  Tongue midline, slight right marginal mandibular neuropraxia, equal strength upper and lower extremities bilaterally  Assessment/Plan:  This is a 74 y.o. male who is s/p right carotid endarterectomy  Stable post-op.  Neuro exam intact except for mild right marginal mandibular palsy. Neck without hematoma. Continue to monitor.  To 3S soon.    Maris Berger, [place] Pager: [phone] 02/25/2015 2:57 PM

## 2015-02-25 NOTE — H&P (View-Only) (Signed)
   Patient name: Ethan Vargas MRN: [id number] DOB: 12/26/1940 Sex: male   Referred by: Dr. Berry  Reason for referral:  Chief Complaint  Patient presents with  . New Evaluation    Ref. by Dr. Berry C/O  Right  Carotid , Pt. has dizziness off/on  2 - 3 mo.     HISTORY OF PRESENT ILLNESS: This is a 74-year-old gentleman who was referred today for evaluation of right carotid stenosis.  The patient has been followed with serial ultrasounds.  His most recent ultrasound showed regression of disease on the right to greater than 80%.  He is asymptomatic.  Specifically, he denies numbness or weakness in either extremity.  He denies third speech.  He denies amaurosis fugax.  The patient suffers from diabetes.  This is moderately well controlled.  His most recent hemoglobin A1c was 6.8.  He has a history of coronary artery disease.  He is status post CABG in 1998.  He has had a myocardial infarction in the past.  He has undergone percutaneous stenting in 2003 and 2010.  He also suffers from congestive heart failure.  He is a nonsmoker.  He is on multiple medications for hypertension including an ACE inhibitor.  He has undergone stenting of his renal artery in the past.  This was for hypertension.  He is medically managed for her cholesterol with a statin.  There is a strong family history for early cardiovascular disease.  Past Medical History  Diagnosis Date  . CAD (coronary artery disease)     a. Remote CABG 1998. b. Inf MI 2003 s/ DES to SVG-RCA. c. Additional stenting in 2010 - occluded SVG to RCA and DES to SVG-Diag#1. d. Nuc 07/2013 with Intermediate risk stress nuclear study.  There is a medium size defect of moderate severity affecting the apical anterior, mid anterior and mid anteroseptal segments. There is global hypokinesis.  EF 28%.  . Sleep apnea   . Diabetes mellitus     a. c/b neuropathy, nephropathy.  . Ischemic cardiomyopathy   . Hypertension   . Anemia of chronic disease   .  Chronic systolic CHF (congestive heart failure)   . Peripheral vascular disease   . Cellulitis Dec./Jan 2011 and 2012  . CKD (chronic kidney disease), stage III   . Hyperlipidemia   . Barrett's esophageal ulceration   . Renal artery stenosis     a. s/p stent 2007.  . Right-sided carotid artery disease   . Atrial fibrillation   . Myocardial infarction Feb. 2003    Heart Attack    Past Surgical History  Procedure Laterality Date  . Coronary artery bypass graft  Sept. 1998  . Arterial thrombectomy  2003    X's 2 stents  . Stents surgery  2007  and  2010    Kidney  ( 2007 ) and  Heart ( Sept. 1998 )  . Knee surgery  1961    Right knee    Social History   Social History  . Marital Status: Married    Spouse Name: N/A  . Number of Children: N/A  . Years of Education: N/A   Occupational History  . Not on file.   Social History Main Topics  . Smoking status: Never Smoker   . Smokeless tobacco: Never Used  . Alcohol Use: No  . Drug Use: No  . Sexual Activity: Not on file   Other Topics Concern  . Not on file   Social History Narrative     Lives in [place] with spouse.  Investment advisor.  Enjoys spending time with grandchildren.    Family History  Problem Relation Age of Onset  . Hypertension Mother   . Deep vein thrombosis Mother   . Heart disease Father     Heart Disease before age 60  . Heart attack Father   . Deep vein thrombosis Sister     Amputation  . Diabetes Brother   . Heart disease Brother     Anerysm stomach  . Hypertension Brother   . Deep vein thrombosis Brother     Amputation  . Heart disease Brother     Heart disease before age 60  . Hypertension Brother   . Stroke Neg Hx     Allergies as of 02/14/2015 - Review Complete 02/14/2015  Allergen Reaction Noted  . Contrast media [iodinated diagnostic agents] Swelling 05/28/2011  . Iodine Swelling   . Mercury Swelling   . Clopidogrel bisulfate Rash   . Latex Other (See Comments)   .  Sulfonamide derivatives Other (See Comments)     Current Outpatient Prescriptions on File Prior to Visit  Medication Sig Dispense Refill  . acetaminophen (TYLENOL) 325 MG tablet Take 325 mg by mouth 2 (two) times daily.    . aspirin EC 81 MG tablet Take 81 mg by mouth daily.    . azelastine (OPTIVAR) 0.05 % ophthalmic solution Place 1 drop into both eyes daily as needed (ALLERGIES AND DRY EYES). To affected eyes     . carvedilol (COREG) 12.5 MG tablet TAKE 1 TABLET TWICE DAILY. 60 tablet 4  . cholecalciferol (VITAMIN D) 1000 UNITS tablet Take 1,000 Units by mouth daily.      . CORDRAN 4 MCG/SQCM TAPE as needed (FOR DRY CRACKED HANDS). Apply to hands    . doxazosin (CARDURA) 2 MG tablet Take 1 tablet (2 mg total) by mouth daily. 30 tablet 11  . ferrous sulfate 325 (65 FE) MG tablet Take 325 mg by mouth daily.     . furosemide (LASIX) 80 MG tablet TAKE 1 TABLET ONCE DAILY. 30 tablet 0  . isosorbide mononitrate (IMDUR) 30 MG 24 hr tablet TAKE 1/2 TABLET EVERY DAY. 15 tablet 1  . latanoprost (XALATAN) 0.005 % ophthalmic solution Place 1 drop into both eyes at bedtime.    . LEVEMIR FLEXTOUCH 100 UNIT/ML Pen as directed. TAKE 30 UNITS    . lisinopril (PRINIVIL,ZESTRIL) 20 MG tablet Take 20 mg by mouth daily.     . montelukast (SINGULAIR) 10 MG tablet Take 10 mg by mouth daily.     . NITROSTAT 0.4 MG SL tablet 1 TABLET UNDER TONGUE AS NEEDED. MAY RPT. EVERY 5 MIN. UP TO 3 TIMES.CALL 911 IF NOT FULLY RELIEVED 25 tablet 1  . PARoxetine (PAXIL) 20 MG tablet Take 20 mg by mouth daily.     . potassium chloride (K-DUR) 10 MEQ tablet Take 1 tablet (10 mEq total) by mouth daily. 30 tablet 5  . rosuvastatin (CRESTOR) 10 MG tablet Take 10 mg by mouth daily.      . spironolactone (ALDACTONE) 25 MG tablet TAKE 1/2 TABLET DAILY. 15 tablet 0  . testosterone cypionate (DEPOTESTOTERONE CYPIONATE) 200 MG/ML injection every 21 ( twenty-one) days.    . TRADJENTA 5 MG TABS tablet Take 5 mg by mouth daily.    .  vitamin B-12 (CYANOCOBALAMIN) 1000 MCG tablet Take 1,000 mcg by mouth daily.    . fluticasone (FLONASE) 50 MCG/ACT nasal spray Place 1 spray into both nostrils   2 (two) times daily.     No current facility-administered medications on file prior to visit.     REVIEW OF SYSTEMS: Cardiovascular: No chest pain, chest pressure,  No history of DVT or phlebitis. positive for shortness of breath with exertion and leg swelling  Pulmonary: No productive cough, asthma or wheezing. Neurologic: No weakness, paresthesias, aphasia, or amaurosis. positive for  dizziness. Hematologic: No bleeding problems or clotting disorders. Musculoskeletal: No joint pain or joint swelling. Gastrointestinal: No blood in stool or hematemesis Genitourinary: No dysuria or hematuria. Psychiatric:: No history of major depression. Integumentary: No rashes or ulcers. Constitutional: No fever or chills.  PHYSICAL EXAMINATION:  Filed Vitals:   02/14/15 1324 02/14/15 1328  BP: 97/33 101/50  Pulse: 69 65  Temp:  98.4 F (36.9 C)  TempSrc:  Oral  Resp:  16  Height:  5' 7" (1.702 m)  Weight:  149 lb (67.586 kg)  SpO2:  98%   Body mass index is 23.33 kg/(m^2). General: The patient appears their stated age.   HEENT:  No gross abnormalities Pulmonary: Respirations are non-labored Musculoskeletal: There are no major deformities.   Neurologic: No focal weakness or paresthesias are detected, Skin: There are no ulcer or rashes noted. Psychiatric: The patient has normal affect. Cardiovascular: There is a regular rate and rhythm without significant murmur appreciated. palpable pedal pulses.  No carotid bruits.    Diagnostic Studies: I have reviewed his carotid Doppler studies which showed greater than 80% right carotid stenosis.  The bifurcation is in the mid neck.  There is 1-39 percent stenosis on the left   Assessment:  asymptomatic right carotid stenosis  Plan: I discussed our options for treatment which include  medical management versus endarterectomy.  The patient decided to proceed with right carotid endarterectomy.  The risks and benefits of the operation were discussed with the patient including the risk of stroke and nerve injury.  All his questions were answered.  His operations been scheduled for Friday, September 30.       V. Wells Brabham IV, M.D. [facility] [place] Office: [phone] Pager:  [phone]   

## 2015-02-25 NOTE — Transfer of Care (Signed)
Immediate Anesthesia Transfer of Care Note  Patient: Ethan Vargas  Procedure(s) Performed: Procedure(s): RIGHT CAROTID ARTERY ENDARTERECTOMY  (Right) WITH XENOSURE BOVINE PATCH ANGIOPLASTY (Right)  Patient Location: PACU  Anesthesia Type:General  Level of Consciousness: awake, alert  and oriented  Airway & Oxygen Therapy: Patient Spontanous Breathing and Patient connected to face mask oxygen  Post-op Assessment: Report given to RN and Post -op Vital signs reviewed and stable  Post vital signs: Reviewed and stable  Last Vitals:  Filed Vitals:   02/25/15 0617  BP: 117/49  Pulse: 51  Temp: 36.3 C  Resp: 18    Complications: No apparent anesthesia complications

## 2015-02-25 NOTE — Anesthesia Preprocedure Evaluation (Addendum)
Anesthesia Evaluation  Patient identified by MRN, date of birth, ID band Patient awake    Reviewed: Allergy & Precautions, NPO status , Patient's Chart, lab work & pertinent test results, reviewed documented beta blocker date and time   History of Anesthesia Complications (+) PONV and history of anesthetic complications  Airway Mallampati: III  TM Distance: <3 FB Neck ROM: Full    Dental  (+) Teeth Intact, Dental Advisory Given   Pulmonary shortness of breath, sleep apnea ,    Pulmonary exam normal breath sounds clear to auscultation       Cardiovascular Exercise Tolerance: Poor hypertension, Pt. on medications and Pt. on home beta blockers + CAD, + Past MI, + Cardiac Stents, + CABG, + Peripheral Vascular Disease and +CHF  Normal cardiovascular exam+ Valvular Problems/Murmurs AI and MR  Rhythm:Regular Rate:Normal     Neuro/Psych  Neuromuscular disease    GI/Hepatic Neg liver ROS, hiatal hernia, PUD,   Endo/Other  negative endocrine ROSdiabetes, Type 2, Insulin Dependent  Renal/GU Renal InsufficiencyRenal disease     Musculoskeletal  (+) Arthritis , Osteoarthritis,    Abdominal   Peds  Hematology  (+) Blood dyscrasia, anemia ,   Anesthesia Other Findings Day of surgery medications reviewed with the patient.  Reproductive/Obstetrics                         Anesthesia Physical Anesthesia Plan  ASA: III  Anesthesia Plan: General   Post-op Pain Management:    Induction: Intravenous  Airway Management Planned: Oral ETT and Video Laryngoscope Planned  Additional Equipment: Arterial line  Intra-op Plan:   Post-operative Plan: Extubation in OR  Informed Consent: I have reviewed the patients History and Physical, chart, labs and discussed the procedure including the risks, benefits and alternatives for the proposed anesthesia with the patient or authorized representative who has indicated  his/her understanding and acceptance.   Dental advisory given  Plan Discussed with: CRNA  Anesthesia Plan Comments: (Risks/benefits of general anesthesia discussed with patient including risk of damage to teeth, lips, gum, and tongue, nausea/vomiting, allergic reactions to medications, and the possibility of heart attack, stroke and death.  All patient questions answered.  Patient wishes to proceed.)       Anesthesia Quick Evaluation TTE 03/18/14 Study Conclusions  - Left ventricle: The cavity size was mildly dilated. Wall thickness was increased in a pattern of mild LVH. Systolic function was mildly to moderately reduced. The estimated ejection fraction was in the range of 40% to 45% with severe hypokinesis of inferore wall (base, mid; mild hypokinesis elsewhere. Features are consistent with a pseudonormal left ventricular filling pattern, with concomitant abnormal relaxation and increased filling pressure (grade 2 diastolic dysfunction). - Aortic valve: There was mild to moderate regurgitation. - Mitral valve: There was mild regurgitation. - Left atrium: The atrium was moderately dilated. - Right ventricle: Systolic function was moderately to severely reduced.

## 2015-02-25 NOTE — Anesthesia Postprocedure Evaluation (Signed)
  Anesthesia Post-op Note  Patient: Ethan Vargas  Procedure(s) Performed: Procedure(s) (LRB): RIGHT CAROTID ARTERY ENDARTERECTOMY  (Right) WITH XENOSURE BOVINE PATCH ANGIOPLASTY (Right)  Patient Location: PACU  Anesthesia Type: General  Level of Consciousness: awake and alert   Airway and Oxygen Therapy: Patient Spontanous Breathing  Post-op Pain: mild  Post-op Assessment: Post-op Vital signs reviewed, Patient's Cardiovascular Status Stable, Respiratory Function Stable, Patent Airway and No signs of Nausea or vomiting  Last Vitals:  Filed Vitals:   02/25/15 1245  BP: 157/72  Pulse: 77  Temp:   Resp: 19    Post-op Vital Signs: stable   Complications: No apparent anesthesia complications

## 2015-02-25 NOTE — Op Note (Signed)
Patient name: Ethan Vargas MRN: [id number] DOB: 1941/03/31 Sex: male  02/25/2015 Pre-operative Diagnosis: Asymptomatic   right carotid stenosis Post-operative diagnosis:  Same Surgeon:  Durene Cal Assistants:  Karsten Ro Procedure:    right carotid Endarterectomy with bovine pericardial  patch angioplasty Anesthesia:  General Blood Loss:  See anesthesia record Specimens:  Carotid Plaque to pathology  Findings:  85 %stenosis; Thrombus:  none  Indications:  74 year old with asymptomatic right carotid stenosis by u/s  Procedure:  The patient was identified in the holding area and taken to [facility] OR ROOM 16  The patient was then placed supine on the table.   General endotrachial anesthesia was administered.  The patient was prepped and draped in the usual sterile fashion.  A time out was called and antibiotics were administered.  The incision was made along the anterior border of the right sternocleidomastoid muscle.  Cautery was used to dissect through the subcutaneous tissue.  The platysma muscle was divided with cautery.  The internal jugular vein was exposed along its anterior medial border.  The common facial vein was exposed and then divided between 2-0 silk ties and metal clips.  The common carotid artery was then circumferentially exposed and encircled with an umbilical tape.  The vagus nerve was identified and protected.  Next sharp dissection was used to expose the external carotid artery and the superior thyroid artery.  The were encircled with a blue vessel loop and a 2-0 silk tie respectively.  Finally, the internal carotid was carefully dissected free.  An umbilical tape was placed around the internal carotid artery distal to the diseased segment.  The hypoglossal nerve was visualized throughout and protected.  The patient was given systemic heparinization.  A bovine carotid patch was selected and prepared on the back table.  A 10 french shunt was also prepared.  After blood pressure  readings were appropriate and the heparin had been given time to circulate, the internal carotid artery was occluded with a baby Gregory clamp.  The external and common carotid arteries were then occluded with vascular clamps and the 2-0 tie tightened on the superior thyroid artery.  A #11 blade was used to make an arteriotomy in the common carotid artery.  This was extended with Potts scissors along the anterior and lateral border of the common and internal carotid artery.  Approximately 85% stenosis was identified.  There was no thrombus identified.  The 10 french shunt was then placed.  A kleiner kuntz elevator was used to perform endarterectomy.  An eversion endarterectomy was performed in the external carotid artery.  A good distal endpoint was obtained in the internal carotid artery.  The specimen was removed and sent to pathology.  Heparinized saline was used to irrigate the endarterectomized field.  All potential embolic debris was removed.  Bovine pericardial patch angioplasty was then performed using a running 6-0 Prolene. Just prior to completion of the repair, the shunt was removed. The common internal and external carotid arteries were all appropriately flushed. The artery was again irrigated with heparin saline.  The anastomosis was then secured. The clamp was first released on the external carotid artery followed by the common carotid artery approximately 30 seconds later, bloodflow was reestablish through the internal carotid artery.  Next, a hand-held  Doppler was used to evaluate the signals in the common, external, and internal  carotid arteries, all of which had appropriate signals. I then administered  50 mg protamine. The wound was then irrigated.  After hemostasis was achieved, the carotid sheath was reapproximated with 3-0 Vicryl. The  platysma muscle was reapproximated with running 3-0 Vicryl. The skin  was closed with 4-0 Vicryl. Dermabond was placed on the skin. The  patient was then  successfully extubated. His neurologic exam was  similar to his preprocedural exam. The patient was then taken to recovery room  in stable condition. There were no complications.     Disposition:  To PACU in stable condition.  Relevant Operative Details:  Disease extended down to the clavicle. I closed the common carotid artery primarily and used a patch on the internal and distal 1 cm of the common carotid artery.  After the endarterectomy, I removed the shunt,as there was excellent back bleeding and I needed visualization of the distal internal carotid endpoint.  I left it out for the patch angioplasty.  Juleen China, M.D. [facility] [place] Office: [phone] Pager:  [phone]

## 2015-02-25 NOTE — Anesthesia Procedure Notes (Signed)
Procedure Name: Intubation Date/Time: 02/25/2015 7:53 AM Performed by: Dairl Ponder Pre-anesthesia Checklist: Patient identified, Emergency Drugs available, Suction available, Patient being monitored and Timeout performed Patient Re-evaluated:Patient Re-evaluated prior to inductionOxygen Delivery Method: Circle system utilized Preoxygenation: Pre-oxygenation with 100% oxygen Intubation Type: IV induction Ventilation: Mask ventilation without difficulty Laryngoscope Size: Glidescope and 4 Grade View: Grade I Tube type: Oral Tube size: 7.5 mm Number of attempts: 1 Airway Equipment and Method: Stylet Placement Confirmation: ETT inserted through vocal cords under direct vision,  positive ETCO2 and breath sounds checked- equal and bilateral Secured at: 23 cm Tube secured with: Tape Dental Injury: Teeth and Oropharynx as per pre-operative assessment

## 2015-02-25 NOTE — Progress Notes (Signed)
pts site on right neck slighty puffy Kim pa at bedside assesses site will continue to monitor observe

## 2015-02-25 NOTE — Progress Notes (Signed)
Utilization review completed.  

## 2015-02-26 ENCOUNTER — Telehealth: Payer: Self-pay | Admitting: Surgery

## 2015-02-26 LAB — GLUCOSE, CAPILLARY
GLUCOSE-CAPILLARY: 118 mg/dL — AB (ref 65–99)
GLUCOSE-CAPILLARY: 176 mg/dL — AB (ref 65–99)
GLUCOSE-CAPILLARY: 146 mg/dL — AB (ref 65–99)
Glucose-Capillary: 177 mg/dL — ABNORMAL HIGH (ref 65–99)
Glucose-Capillary: 275 mg/dL — ABNORMAL HIGH (ref 65–99)

## 2015-02-26 LAB — HEMOGLOBIN A1C
Hgb A1c MFr Bld: 8 % — ABNORMAL HIGH (ref 4.8–5.6)
Mean Plasma Glucose: 183 mg/dL

## 2015-02-26 LAB — BASIC METABOLIC PANEL
ANION GAP: 8 (ref 5–15)
Anion gap: 10 (ref 5–15)
BUN: 51 mg/dL — AB (ref 6–20)
BUN: 58 mg/dL — ABNORMAL HIGH (ref 6–20)
CALCIUM: 8.6 mg/dL — AB (ref 8.9–10.3)
CO2: 21 mmol/L — ABNORMAL LOW (ref 22–32)
CO2: 24 mmol/L (ref 22–32)
Calcium: 8.6 mg/dL — ABNORMAL LOW (ref 8.9–10.3)
Chloride: 104 mmol/L (ref 101–111)
Chloride: 100 mmol/L — ABNORMAL LOW (ref 101–111)
Creatinine, Ser: 2.68 mg/dL — ABNORMAL HIGH (ref 0.61–1.24)
Creatinine, Ser: 3.09 mg/dL — ABNORMAL HIGH (ref 0.61–1.24)
GFR calc Af Amer: 26 mL/min — ABNORMAL LOW (ref >60)
GFR calc Af Amer: 22 mL/min — ABNORMAL LOW (ref >60)
GFR calc non Af Amer: 19 mL/min — ABNORMAL LOW (ref >60)
GFR, EST NON AFRICAN AMERICAN: 22 mL/min — AB (ref >60)
GLUCOSE: 242 mg/dL — AB (ref 65–99)
Glucose, Bld: 161 mg/dL — ABNORMAL HIGH (ref 65–99)
POTASSIUM: 5.9 mmol/L — AB (ref 3.5–5.1)
POTASSIUM: 5.4 mmol/L — AB (ref 3.5–5.1)
SODIUM: 135 mmol/L (ref 135–145)
Sodium: 132 mmol/L — ABNORMAL LOW (ref 135–145)

## 2015-02-26 LAB — CBC
HCT: 32.6 % — ABNORMAL LOW (ref 39.0–52.0)
Hemoglobin: 10.5 g/dL — ABNORMAL LOW (ref 13.0–17.0)
MCH: 31.3 pg (ref 26.0–34.0)
MCHC: 32.2 g/dL (ref 30.0–36.0)
MCV: 97 fL (ref 78.0–100.0)
PLATELETS: 86 10*3/uL — AB (ref 150–400)
RBC: 3.36 MIL/uL — AB (ref 4.22–5.81)
RDW: 13.7 % (ref 11.5–15.5)
WBC: 10.3 10*3/uL (ref 4.0–10.5)

## 2015-02-26 MED ORDER — SODIUM CHLORIDE 0.45 % IV SOLN
INTRAVENOUS | Status: DC
  Administered 2015-02-26 – 2015-02-28 (×2): via INTRAVENOUS

## 2015-02-26 NOTE — Telephone Encounter (Signed)
-----   Message from Sharee Pimple, RN sent at 02/25/2015  3:21 PM EDT ----- Regarding: schedule   ----- Message -----    From: Raymond Gurney, PA-C    Sent: 02/25/2015   3:19 PM      To: Vvs Charge Pool  S/p right CEA 02/25/15  F/u with [facility] in 2 weeks  Thanks Selena Batten

## 2015-02-26 NOTE — Progress Notes (Signed)
Pt noted to be in Sinus rhythm/sinus tach with 1st degree AV block with frequent PVC's on different occasions throughout the night while sleeping.  Pt currently back in Sinus Rhythm.  ECG recorded in pt chart.  MD on call called.  Will continue to monitor

## 2015-02-26 NOTE — Progress Notes (Signed)
[facility] [place]  Subjective  - feels ok   Objective 100/53 82 99.6 F (37.6 C) (Oral) 18 91%  Intake/Output Summary (Last 24 hours) at 02/26/15 0858 Last data filed at 02/26/15 0800  Gross per 24 hour  Intake 2748.75 ml  Output    950 ml  Net 1798.75 ml   Right neck some mild swelling no focal hematoma Mild right corner of mouth droop consistent with marginal mandibular palsy Tongue midline UE/LE 5/5 motor  Assessment/Planning: Overall doing well Hyperkalemia will hydrate some today repeat later today hold potassium for now Has baseline renal dysfunction Possible d/c home today if potassium reasonable  Ethan Vargas 02/26/2015 8:58 AM --  Laboratory Lab Results:  Recent Labs  02/25/15 1605 02/26/15 0500  WBC 6.3 10.3  HGB 11.2* 10.5*  HCT 34.2* 32.6*  PLT 76* 86*   BMET  Recent Labs  02/25/15 1605 02/26/15 0500  NA  --  135  K  --  5.9*  CL  --  104  CO2  --  21*  GLUCOSE  --  161*  BUN  --  51*  CREATININE 2.29* 2.68*  CALCIUM  --  8.6*    COAG Lab Results  Component Value Date   INR 1.13 02/21/2015   INR 1.02 05/01/2010   INR 0.96 03/03/2010   No results found for: PTT

## 2015-02-26 NOTE — Telephone Encounter (Signed)
LM for pt, dpm °

## 2015-02-27 ENCOUNTER — Encounter (HOSPITAL_COMMUNITY): Payer: Self-pay | Admitting: Surgery

## 2015-02-27 LAB — BASIC METABOLIC PANEL
Anion gap: 8 (ref 5–15)
BUN: 66 mg/dL — AB (ref 6–20)
CO2: 25 mmol/L (ref 22–32)
Calcium: 8.4 mg/dL — ABNORMAL LOW (ref 8.9–10.3)
Chloride: 101 mmol/L (ref 101–111)
Creatinine, Ser: 3.47 mg/dL — ABNORMAL HIGH (ref 0.61–1.24)
GFR calc Af Amer: 19 mL/min — ABNORMAL LOW (ref >60)
GFR, EST NON AFRICAN AMERICAN: 16 mL/min — AB (ref >60)
Glucose, Bld: 154 mg/dL — ABNORMAL HIGH (ref 65–99)
POTASSIUM: 5 mmol/L (ref 3.5–5.1)
SODIUM: 134 mmol/L — AB (ref 135–145)

## 2015-02-27 LAB — GLUCOSE, CAPILLARY
GLUCOSE-CAPILLARY: 170 mg/dL — AB (ref 65–99)
GLUCOSE-CAPILLARY: 200 mg/dL — AB (ref 65–99)
Glucose-Capillary: 248 mg/dL — ABNORMAL HIGH (ref 65–99)
Glucose-Capillary: 209 mg/dL — ABNORMAL HIGH (ref 65–99)

## 2015-02-27 NOTE — Progress Notes (Signed)
[facility] [place]  Subjective  - feels ok    Objective 96/50 77 97.9 F (36.6 C) (Oral) 12 98%  Intake/Output Summary (Last 24 hours) at 02/27/15 0902 Last data filed at 02/27/15 0800  Gross per 24 hour  Intake 1802.5 ml  Output    920 ml  Net  882.5 ml   Right neck unchanged Voice slightly hoarse Right facial droop unchanged   Assessment/Planning: Doing well from carotid standpoint with exception of some cranial nerve neuropraxia However serum creatinine continues to rise in what appears to be acute worsening of chronic renal failure Will stop lasix today.  Spironolactone and Lisinopril stopped yesterday.  Continue gentle hydration will consult renal if worsening function tomorrow.  Strict I and Lacy Duverney 02/27/2015 9:02 AM --  Laboratory Lab Results:  Recent Labs  02/25/15 1605 02/26/15 0500  WBC 6.3 10.3  HGB 11.2* 10.5*  HCT 34.2* 32.6*  PLT 76* 86*   BMET  Recent Labs  02/26/15 1429 02/27/15 0250  NA 132* 134*  K 5.4* 5.0  CL 100* 101  CO2 24 25  GLUCOSE 242* 154*  BUN 58* 66*  CREATININE 3.09* 3.47*  CALCIUM 8.6* 8.4*    COAG Lab Results  Component Value Date   INR 1.13 02/21/2015   INR 1.02 05/01/2010   INR 0.96 03/03/2010   No results found for: PTT

## 2015-02-28 LAB — BASIC METABOLIC PANEL
Anion gap: 9 (ref 5–15)
BUN: 63 mg/dL — AB (ref 6–20)
CHLORIDE: 102 mmol/L (ref 101–111)
CO2: 24 mmol/L (ref 22–32)
CREATININE: 2.88 mg/dL — AB (ref 0.61–1.24)
Calcium: 9 mg/dL (ref 8.9–10.3)
GFR calc Af Amer: 24 mL/min — ABNORMAL LOW (ref >60)
GFR calc non Af Amer: 20 mL/min — ABNORMAL LOW (ref >60)
Glucose, Bld: 185 mg/dL — ABNORMAL HIGH (ref 65–99)
POTASSIUM: 4.5 mmol/L (ref 3.5–5.1)
SODIUM: 135 mmol/L (ref 135–145)

## 2015-02-28 LAB — GLUCOSE, CAPILLARY
GLUCOSE-CAPILLARY: 264 mg/dL — AB (ref 65–99)
GLUCOSE-CAPILLARY: 223 mg/dL — AB (ref 65–99)
Glucose-Capillary: 252 mg/dL — ABNORMAL HIGH (ref 65–99)
Glucose-Capillary: 249 mg/dL — ABNORMAL HIGH (ref 65–99)
Glucose-Capillary: 227 mg/dL — ABNORMAL HIGH (ref 65–99)

## 2015-02-28 MED ORDER — INSULIN DETEMIR 100 UNIT/ML ~~LOC~~ SOLN
15.0000 [IU] | Freq: Every day | SUBCUTANEOUS | Status: DC
  Administered 2015-02-28 – 2015-03-01 (×2): 15 [IU] via SUBCUTANEOUS
  Filled 2015-02-28 (×2): qty 0.15

## 2015-02-28 NOTE — Progress Notes (Signed)
Inpatient Diabetes Program Recommendations  AACE/ADA: New Consensus Statement on Inpatient Glycemic Control (2015)  Target Ranges:  Prepandial:   less than 140 mg/dL      Peak postprandial:   less than 180 mg/dL (1-2 hours)      Critically ill patients:  140 - 180 mg/dL    Results for Ethan Vargas, Ethan Vargas (MRN [id number]) as of 02/28/2015 09:02  Ref. Range 02/27/2015 07:57 02/27/2015 11:57 02/27/2015 18:53 02/27/2015 21:11  Glucose-Capillary Latest Ref Range: 65-99 mg/dL 782 (H) 956 (H) 213 (H) 200 (H)    Results for Ethan Vargas, Ethan Vargas (MRN [id number]) as of 02/28/2015 09:02  Ref. Range 02/28/2015 08:05  Glucose-Capillary Latest Ref Range: 65-99 mg/dL 629 (H)     Admit for Carotid Endarectomy.  History: DM, CHF, CKD  Home DM Meds: Levemir 30 units as directed       Tradjenta 5 mg daily  Current Insulin Orders: Novolog Moderate SSI (0-15 units) TID AC      Tradjenta 5 mg daily     -POD #3.  Patient still not eating all that well per MD notes.  -Glucose levels on the rise.  -Called Lianne Cure, PA with Vascular team.  Asked PA if we could start 50% of patient's home dose of Levemir.  Telephone orders given to me to start Levemir 15 units daily.  New orders placed and RN called to review new orders.     ----Will follow patient during hospitalization----  Ambrose Finland RN, MSN, CDE Diabetes Coordinator Inpatient Glycemic Control Team Team Pager: [phone] (8a-5p)

## 2015-02-28 NOTE — Progress Notes (Signed)
Utilization review completed.  

## 2015-02-28 NOTE — Progress Notes (Signed)
[facility] [place]  Subjective  - Doing OK, not much appetite.    Objective 134/57 84 99 F (37.2 C) (Oral) 23 90%  Intake/Output Summary (Last 24 hours) at 02/28/15 0747 Last data filed at 02/28/15 0600  Gross per 24 hour  Intake   1440 ml  Output   1350 ml  Net     90 ml    No tongue deviation , smile appears symmetric this am Grip 5/5 Palpable radial pulse right UE Right neck incision C/D healing well without hematoma  Assessment/Planning: POD # 3 right CEA Cr 2.88 improving K+ decreased now 4.5 UO WNL daily total 1,350 (50 cc/hr) I will transfer him to 2W plan to observe him another day cont. hydration   Clinton Gallant [facility] 02/28/2015 7:47 AM --  Laboratory Lab Results:  Recent Labs  02/25/15 1605 02/26/15 0500  WBC 6.3 10.3  HGB 11.2* 10.5*  HCT 34.2* 32.6*  PLT 76* 86*   BMET  Recent Labs  02/27/15 0250 02/28/15 0339  NA 134* 135  K 5.0 4.5  CL 101 102  CO2 25 24  GLUCOSE 154* 185*  BUN 66* 63*  CREATININE 3.47* 2.88*  CALCIUM 8.4* 9.0    COAG Lab Results  Component Value Date   INR 1.13 02/21/2015   INR 1.02 05/01/2010   INR 0.96 03/03/2010   No results found for: PTT

## 2015-03-01 LAB — BASIC METABOLIC PANEL
ANION GAP: 7 (ref 5–15)
BUN: 50 mg/dL — AB (ref 6–20)
CHLORIDE: 102 mmol/L (ref 101–111)
CO2: 26 mmol/L (ref 22–32)
Calcium: 8.4 mg/dL — ABNORMAL LOW (ref 8.9–10.3)
Creatinine, Ser: 2.36 mg/dL — ABNORMAL HIGH (ref 0.61–1.24)
GFR calc Af Amer: 30 mL/min — ABNORMAL LOW (ref >60)
GFR calc non Af Amer: 26 mL/min — ABNORMAL LOW (ref >60)
Glucose, Bld: 197 mg/dL — ABNORMAL HIGH (ref 65–99)
POTASSIUM: 4.2 mmol/L (ref 3.5–5.1)
SODIUM: 135 mmol/L (ref 135–145)

## 2015-03-01 LAB — GLUCOSE, CAPILLARY: GLUCOSE-CAPILLARY: 171 mg/dL — AB (ref 65–99)

## 2015-03-01 NOTE — Care Management Note (Signed)
Case Management Note Donn Pierini RN, BSN Unit 2W-Case Manager [phone]  Patient Details  Name: Ethan Vargas MRN: [id number] Date of Birth: [birth year]  Subjective/Objective:        Pt admitted s/p CEA            Action/Plan: PTA pt lived at home- plan to return home- no needs noted  Expected Discharge Date: 03/01/15              Expected Discharge Plan:  Home/Self Care  In-House Referral:     Discharge planning Services  CM Consult  Post Acute Care Choice:    Choice offered to:     DME Arranged:    DME Agency:     HH Arranged:    HH Agency:     Status of Service:  Completed, signed off  Medicare Important Message Given:    Date Medicare IM Given:    Medicare IM give by:    Date Additional Medicare IM Given:    Additional Medicare Important Message give by:     If discussed at Long Length of Stay Meetings, dates discussed:    Additional Comments:  Darrold Span, RN 03/01/2015, 10:29 AM

## 2015-03-01 NOTE — Progress Notes (Signed)
  [facility] Progress Note  Subjective  - POD #4  Doing well. No complaints.   Objective Filed Vitals:   03/01/15 0456  BP: 141/65  Pulse: 65  Temp: 98.1 F (36.7 C)  Resp: 18    Intake/Output Summary (Last 24 hours) at 03/01/15 0739 Last data filed at 03/01/15 0457  Gross per 24 hour  Intake      0 ml  Output    750 ml  Net   -750 ml    Neuro exam intact except for right marginal mandibular neuropraxia Right neck incision without hematoma  Assessment/Planning: 74 y.o. male is s/p: right carotid endartectomy 4 Days Post-Op   Creatinine trending back towards baseline. Continue to hold nephrotoxic meds at discharge. Will schedule appointment for PCP this week.  Discharge home today.   Raymond Gurney 03/01/2015 7:39 AM --  Laboratory CBC    Component Value Date/Time   WBC 10.3 02/26/2015 0500   HGB 10.5* 02/26/2015 0500   HCT 32.6* 02/26/2015 0500   PLT 86* 02/26/2015 0500    BMET    Component Value Date/Time   NA 135 03/01/2015 0504   K 4.2 03/01/2015 0504   CL 102 03/01/2015 0504   CO2 26 03/01/2015 0504   GLUCOSE 197* 03/01/2015 0504   BUN 50* 03/01/2015 0504   CREATININE 2.36* 03/01/2015 0504   CALCIUM 8.4* 03/01/2015 0504   GFRNONAA 26* 03/01/2015 0504   GFRAA 30* 03/01/2015 0504    COAG Lab Results  Component Value Date   INR 1.13 02/21/2015   INR 1.02 05/01/2010   INR 0.96 03/03/2010   No results found for: PTT  Antibiotics Anti-infectives    Start     Dose/Rate Route Frequency Ordered Stop   02/25/15 1900  cefUROXime (ZINACEF) 1.5 g in dextrose 5 % 50 mL IVPB     1.5 g 100 mL/hr over 30 Minutes Intravenous Every 12 hours 02/25/15 1519 02/26/15 0635   02/25/15 0700  cefUROXime (ZINACEF) 1.5 g in dextrose 5 % 50 mL IVPB     1.5 g 100 mL/hr over 30 Minutes Intravenous To ShortStay Surgical 02/24/15 1403 02/25/15 0755       Maris Berger, PA-C [facility] Office: [phone] Pager:  [phone] 03/01/2015 7:39 AM

## 2015-03-03 NOTE — Discharge Summary (Signed)
[facility] Discharge Summary  Ethan Vargas 1941/05/19 74 y.o. male  [id number]  Admission Date: 02/25/2015  Discharge Date: 03/01/2015  Physician: Coral Else, MD  Admission Diagnosis: Right carotid artery stenosis I65.21  HPI:   This is a 74 y.o. male who was referred to Dr. Myra Gianotti for evaluation of right carotid stenosis. He has been followed with serial ultrasounds. His most recent ultrasound showed regression of disease on the right to greater than 80%. He is asymptomatic. Specifically, he denies numbness or weakness in either extremity. He denies third speech. He denies amaurosis fugax.  The patient suffers from diabetes. This is moderately well controlled. His most recent hemoglobin A1c was 6.8. He has a history of coronary artery disease. He is status post CABG in 1998. He has had a myocardial infarction in the past. He has undergone percutaneous stenting in 2003 and 2010. He also suffers from congestive heart failure. He is a nonsmoker. He is on multiple medications for hypertension including an ACE inhibitor. He has undergone stenting of his renal artery in the past. This was for hypertension. He is medically managed for her cholesterol with a statin. There is a strong family history for early cardiovascular disease.  Hospital Course:  The patient was admitted to the hospital and taken to the operating room on 02/25/2015 and underwent right carotid endarterectomy.  The patient tolerated the procedure well and was transported to the PACU in stable condition.  By POD 1, the patient's neuro status was intact except for mild right marginal mandibular palsy. His neck had some mild swelling but no focal hematoma. He had hyperkalemia and was hydrated. His home potassium was held. His renal function was at baseline. His spironolactone and lisinopril were discontinued.   POD 2: His creatinine rose. His lasix was discontinued.  His hyperkalemia  was improving. Gentle hydration was continued.   POD 3: His creatinine was improving and potassium normalized. His urine output was normal. He was transferred to telemetry for further hydration.  POD 4: His creatinine continued to improve towards baseline. His neuro exam was intact except for stable right marginal mandibular neuropraxia. His incision was without hematoma. He was discharged home in good condition. His nephrotoxic drugs were held and a follow-up appointment was made with his PCP.     Recent Labs  03/01/15 0504  NA 135  K 4.2  CL 102  CO2 26  GLUCOSE 197*  BUN 50*  CALCIUM 8.4*   No results for input(s): WBC, HGB, HCT, PLT in the last 72 hours. No results for input(s): INR in the last 72 hours.  Discharge Instructions:   The patient is discharged to home with extensive instructions on wound care and progressive ambulation.  They are instructed not to drive or perform any heavy lifting until returning to see the physician in his office.  Discharge Instructions    CAROTID Sugery: Call MD for difficulty swallowing or speaking; weakness in arms or legs that is a new symtom; severe headache.  If you have increased swelling in the neck and/or  are having difficulty breathing, CALL 911    Complete by:  As directed      Call MD for:  redness, tenderness, or signs of infection (pain, swelling, bleeding, redness, odor or green/yellow discharge around incision site)    Complete by:  As directed      Call MD for:  severe or increased pain, loss or decreased feeling  in affected limb(s)    Complete by:  As directed      Call MD for:  temperature >100.5    Complete by:  As directed      Discharge wound care:    Complete by:  As directed   Wash wound daily with soap and water and pat dry.     Driving Restrictions    Complete by:  As directed   No driving for 2 weeks     Increase activity slowly    Complete by:  As directed   Walk with assistance use walker or cane as needed      Lifting restrictions    Complete by:  As directed   No lifting for 2 weeks     Resume previous diet    Complete by:  As directed            Discharge Diagnosis:  Right carotid artery stenosis I65.21  Secondary Diagnosis: Patient Active Problem List   Diagnosis Date Noted  . Asymptomatic carotid artery stenosis 02/25/2015  . Carotid artery disease (HCC) 12/24/2014  . Obstructive sleep apnea 04/21/2014  . 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 Chronic combined systolic and diastolic heart failure (HCC) 07/06/2013  . Erectile dysfunction 07/06/2013  . Peripheral vascular disease, unspecified (HCC) 08/19/2012  . Chronic kidney disease (CKD), stage III (moderate) 05/31/2011  . Cellulitis and abscess of leg, except foot 05/28/2011    Class: Acute  . Diabetes mellitus type 2 with complications, uncontrolled (HCC) 05/28/2011    Class: Chronic  . Hyperlipidemia 02/16/2010  . PERIPHERAL NEUROPATHY 02/16/2010  . Essential hypertension 02/16/2010  . CORONARY ATHEROSCLEROSIS NATIVE CORONARY ARTERY 02/16/2010  . BARRETTS ESOPHAGUS 02/16/2010  . DYSPNEA 02/16/2010   Past Medical History  Diagnosis Date  . Sleep apnea   . Diabetes mellitus     a. c/b neuropathy, nephropathy.  . Ischemic cardiomyopathy   . Hypertension   . Anemia of chronic disease   . Chronic systolic CHF (congestive heart failure) (HCC)   . Peripheral vascular disease (HCC)   . Cellulitis Dec./Jan 2011 and 2012  . CKD (chronic kidney disease), stage III   . Hyperlipidemia   . Barrett's esophageal ulceration   . Renal artery stenosis (HCC)     a. s/p stent 2007.  . Right-sided carotid artery disease (HCC)   . Atrial fibrillation (HCC)   . Myocardial infarction [facility]) Feb. 2003    Heart Attack  . PONV (postoperative nausea and vomiting)   . CAD (coronary artery disease)     a. Remote CABG 1998. b. Inf MI 2003 s/ DES to SVG-RCA. c. Additional stenting in 2010 - occluded SVG to  RCA and DES to SVG-Diag#1. d. Nuc 07/2013 with Intermediate risk stress nuclear study.  There is a medium size defect of moderate severity affecting the apical anterior, mid anterior and mid anteroseptal segments. There is global hypokinesis.  EF 28%.  . Shortness of breath dyspnea     WITH EXERTION  . Pneumonia     TWICE IN LAST 5 YRS  . History of hiatal hernia     REFLUX  . Arthritis       Medication List    STOP taking these medications        furosemide 80 MG tablet  Commonly known as:  LASIX     lisinopril 20 MG tablet  Commonly known as:  PRINIVIL,ZESTRIL     spironolactone 25 MG tablet  Commonly known as:  ALDACTONE      TAKE  these medications        acetaminophen 325 MG tablet  Commonly known as:  TYLENOL  Take 325 mg by mouth 2 (two) times daily.     aspirin EC 81 MG tablet  Take 81 mg by mouth daily.     azelastine 0.05 % ophthalmic solution  Commonly known as:  OPTIVAR  Place 1 drop into both eyes daily as needed (ALLERGIES AND DRY EYES). To affected eyes     BD PEN NEEDLE NANO U/F 32G X 4 MM Misc  Generic drug:  Insulin Pen Needle     carvedilol 12.5 MG tablet  Commonly known as:  COREG  TAKE 1 TABLET TWICE DAILY.     cholecalciferol 1000 UNITS tablet  Commonly known as:  VITAMIN D  Take 1,000 Units by mouth daily.     CORDRAN 4 MCG/SQCM Tape  Generic drug:  Flurandrenolide  as needed (FOR DRY CRACKED HANDS). Apply to hands     doxazosin 2 MG tablet  Commonly known as:  CARDURA  Take 1 tablet (2 mg total) by mouth daily.     ferrous sulfate 325 (65 FE) MG tablet  Take 325 mg by mouth daily.     fluticasone 50 MCG/ACT nasal spray  Commonly known as:  FLONASE  Place 1 spray into both nostrils 2 (two) times daily.     isosorbide mononitrate 30 MG 24 hr tablet  Commonly known as:  IMDUR  TAKE 1/2 TABLET EVERY DAY.     latanoprost 0.005 % ophthalmic solution  Commonly known as:  XALATAN  Place 1 drop into both eyes at bedtime.     LEVEMIR  FLEXTOUCH 100 UNIT/ML Pen  Generic drug:  Insulin Detemir  as directed. TAKE 30 UNITS     montelukast 10 MG tablet  Commonly known as:  SINGULAIR  Take 10 mg by mouth daily.     NITROSTAT 0.4 MG SL tablet  Generic drug:  nitroGLYCERIN  1 TABLET UNDER TONGUE AS NEEDED. MAY RPT. EVERY 5 MIN. UP TO 3 TIMES.CALL 911 IF NOT FULLY RELIEVED     oxyCODONE-acetaminophen 5-325 MG tablet  Commonly known as:  PERCOCET/ROXICET  Take 1-2 tablets by mouth every 6 (six) hours as needed for moderate pain.     PARoxetine 20 MG tablet  Commonly known as:  PAXIL  Take 20 mg by mouth daily.     potassium chloride 10 MEQ tablet  Commonly known as:  K-DUR  Take 1 tablet (10 mEq total) by mouth daily.     rosuvastatin 10 MG tablet  Commonly known as:  CRESTOR  Take 10 mg by mouth daily.     testosterone cypionate 200 MG/ML injection  Commonly known as:  DEPOTESTOSTERONE CYPIONATE  every 21 ( twenty-one) days.     TRADJENTA 5 MG Tabs tablet  Generic drug:  linagliptin  Take 5 mg by mouth daily.     vitamin B-12 1000 MCG tablet  Commonly known as:  CYANOCOBALAMIN  Take 1,000 mcg by mouth daily.        Percocet #20 No Refill  Disposition: Home  Patient's condition: is Good  Follow up: 1. Dr.  Myra Gianotti in 2 weeks.   Maris Berger, PA-C [facility] [phone]  --- For [facility] use --- Instructions: Press F2 to tab through selections.  Delete question if not applicable.   Modified Rankin score at D/C (0-6): 0  IV medication needed for:  1. Hypertension: No 2. Hypotension: No  Post-op Complications:  No  1. Post-op CVA or TIA: No  2. CN injury: No  Marginal mandibular branch of facial nerve injured.   3. Myocardial infarction: No  4.  CHF: No  5.  Dysrhythmia (new): No  6. Wound infection: No  7. Reperfusion symptoms: No  8. Return to OR: No  Discharge medications: Statin use:  Yes If No:  For Medical reasons,  Non-compliant,   Not-indicated ASA use:  Yes  If No:  For Medical reasons,  Non-compliant,  Not-indicated Beta blocker use:  No If No:  For Medical reasons,  Non-compliant, [x ] Not-indicated ACE-Inhibitor use:  No If No: [x ] For Medical reasons,  Non-compliant,  Not-indicated P2Y12 Antagonist use: No,  Plavix,  Plasugrel,  Ticlopinine,  Ticagrelor,  Other,  No for medical reason,  Non-compliant, [ x] Not-indicated Anti-coagulant use:  No,  Warfarin,  Rivaroxaban,  Dabigatran,  Other,  No for medical reason,  Non-compliant, [x ] Not-indicated

## 2015-03-06 ENCOUNTER — Other Ambulatory Visit: Payer: Self-pay | Admitting: Interventional Cardiology

## 2015-03-10 ENCOUNTER — Encounter: Payer: Self-pay | Admitting: Surgery

## 2015-03-14 ENCOUNTER — Encounter: Payer: Self-pay | Admitting: Surgery

## 2015-03-14 ENCOUNTER — Ambulatory Visit (INDEPENDENT_AMBULATORY_CARE_PROVIDER_SITE_OTHER): Payer: Self-pay | Admitting: Surgery

## 2015-03-14 VITALS — BP 138/71 | HR 62 | Temp 98.3°F | Resp 16 | Ht 60.7 in | Wt 153.0 lb

## 2015-03-14 DIAGNOSIS — Z9889 Other specified postprocedural states: Secondary | ICD-10-CM

## 2015-03-14 NOTE — Progress Notes (Signed)
POST OPERATIVE OFFICE NOTE    CC:  F/u for surgery  HPI:  This is a 74 y.o. male who is s/p right CEA by Dr. Myra Gianotti on 02/25/15 for asymptomatic carotid artery stenosis.  His hospital course was complicated by acute on chronic renal failure that was trending back towards baseline by discharge.  His nephrotoxic medications were discontinued.  He has since f/u with his PCP and his medications were added back except his lisinopril.    He states that he has done well since discharge.  He states the swelling in his neck has greatly improved.  He has had blurry vision in both eyes and attributes this to his blood sugars being elevated.  He does still have a facial droop and has noticed he bites his lip a couple times a day.  Otherwise, he has not had any neurological events.    Allergies  Allergen Reactions  . Contrast Media [Iodinated Diagnostic Agents] Swelling    Premedication with prednisone-  Eyes closed from swelling  . Iodine Swelling  . Mercury Swelling  . Clopidogrel Bisulfate Rash    Plavix  . Sulfonamide Derivatives Other (See Comments)    unknown    Current Outpatient Prescriptions  Medication Sig Dispense Refill  . acetaminophen (TYLENOL) 325 MG tablet Take 325 mg by mouth 2 (two) times daily.    Marland Kitchen aspirin EC 81 MG tablet Take 81 mg by mouth daily.    Marland Kitchen azelastine (OPTIVAR) 0.05 % ophthalmic solution Place 1 drop into both eyes daily as needed (ALLERGIES AND DRY EYES). To affected eyes     . BD PEN NEEDLE NANO U/F 32G X 4 MM MISC     . carvedilol (COREG) 12.5 MG tablet TAKE 1 TABLET TWICE DAILY. 60 tablet 4  . cholecalciferol (VITAMIN D) 1000 UNITS tablet Take 1,000 Units by mouth daily.      Marland Kitchen CORDRAN 4 MCG/SQCM TAPE as needed (FOR DRY CRACKED HANDS). Apply to hands    . doxazosin (CARDURA) 2 MG tablet Take 1 tablet (2 mg total) by mouth daily. 30 tablet 11  . ferrous sulfate 325 (65 FE) MG tablet Take 325 mg by mouth daily.     . fluticasone (FLONASE) 50 MCG/ACT nasal  spray Place 1 spray into both nostrils 2 (two) times daily.    . furosemide (LASIX) 80 MG tablet daily.    . isosorbide mononitrate (IMDUR) 30 MG 24 hr tablet TAKE 1/2 TABLET EVERY DAY. 15 tablet 3  . latanoprost (XALATAN) 0.005 % ophthalmic solution Place 1 drop into both eyes at bedtime.    Marland Kitchen LEVEMIR FLEXTOUCH 100 UNIT/ML Pen as directed. TAKE 30 UNITS    . montelukast (SINGULAIR) 10 MG tablet Take 10 mg by mouth daily.     Marland Kitchen NITROSTAT 0.4 MG SL tablet 1 TABLET UNDER TONGUE AS NEEDED. MAY RPT. EVERY 5 MIN. UP TO 3 TIMES.CALL 911 IF NOT FULLY RELIEVED 25 tablet 1  . oxyCODONE-acetaminophen (PERCOCET/ROXICET) 5-325 MG tablet Take 1-2 tablets by mouth every 6 (six) hours as needed for moderate pain. 20 tablet 0  . PARoxetine (PAXIL) 20 MG tablet Take 20 mg by mouth daily.     . potassium chloride (K-DUR) 10 MEQ tablet Take 1 tablet (10 mEq total) by mouth daily. 30 tablet 5  . potassium chloride (K-DUR,KLOR-CON) 10 MEQ tablet Take 10 mEq by mouth 2 (two) times daily.    . rosuvastatin (CRESTOR) 10 MG tablet Take 10 mg by mouth daily.      Marland Kitchen  spironolactone (ALDACTONE) 25 MG tablet     . testosterone cypionate (DEPOTESTOTERONE CYPIONATE) 200 MG/ML injection every 21 ( twenty-one) days.    . TRADJENTA 5 MG TABS tablet Take 5 mg by mouth daily.    . vitamin B-12 (CYANOCOBALAMIN) 1000 MCG tablet Take 1,000 mcg by mouth daily.     No current facility-administered medications for this visit.     ROS:  See HPI  Physical Exam:  Filed Vitals:   03/14/15 1451  BP: 138/71  Pulse: 62  Temp: 98.3 F (36.8 C)  Resp: 16    Incision:  Healing nicely with some fullness Extremities:  5/5 BUE & BLE Neuro: in tact.  Still with facial droop on the right. Tongue is midline.  Assessment/Plan:  This is a 74 y.o. male who is s/p: Right CEA  -pt doing well and neurologically in tact.   -he is reassured that his facial droop will continue to improve, but may take weeks to several months to return to  baseline.   -he will f/u in 9 months for carotid duplex. -he will call sooner if needed.   Doreatha MassedSamantha Rhyne, PA-C [facility] [phone]  Clinic MD:  Pt seen and examined with Dr. Myra GianottiBrabham   I agree with the above.  I have seen and evaluated the patient.  He is status post right carotid endarterectomy for asymptomatic carotid stenosis.  His postoperative course was complicated by acute on chronic renal insufficiency.  He states that his creatinine is now back to his baseline.  His incision is healing nicely.  He still has a slight marginal mandibular neurapraxia which I have reassured him will improve over time.  I haven't scheduled for follow-up in 9 months with a repeat carotid duplex.  Durene CalWells Naome Brigandi

## 2015-03-14 NOTE — Addendum Note (Signed)
Addended by: Adria DillELDRIDGE-LEWIS, Reyden Smith L on: 03/14/2015 05:41 PM   Modules accepted: Orders

## 2015-04-12 ENCOUNTER — Other Ambulatory Visit: Payer: Self-pay | Admitting: Interventional Cardiology

## 2015-05-09 ENCOUNTER — Other Ambulatory Visit: Payer: Self-pay | Admitting: Interventional Cardiology

## 2015-06-07 ENCOUNTER — Encounter: Payer: Self-pay | Admitting: Interventional Cardiology

## 2015-06-07 ENCOUNTER — Ambulatory Visit (INDEPENDENT_AMBULATORY_CARE_PROVIDER_SITE_OTHER): Payer: Managed Care, Other (non HMO) | Admitting: Interventional Cardiology

## 2015-06-07 VITALS — BP 124/64 | HR 71 | Ht 67.0 in | Wt 153.1 lb

## 2015-06-07 DIAGNOSIS — N183 Chronic kidney disease, stage 3 unspecified: Secondary | ICD-10-CM

## 2015-06-07 DIAGNOSIS — G4733 Obstructive sleep apnea (adult) (pediatric): Secondary | ICD-10-CM | POA: Diagnosis not present

## 2015-06-07 DIAGNOSIS — I5042 Chronic combined systolic (congestive) and diastolic (congestive) heart failure: Secondary | ICD-10-CM | POA: Diagnosis not present

## 2015-06-07 DIAGNOSIS — I6521 Occlusion and stenosis of right carotid artery: Secondary | ICD-10-CM

## 2015-06-07 DIAGNOSIS — I10 Essential (primary) hypertension: Secondary | ICD-10-CM

## 2015-06-07 NOTE — Progress Notes (Signed)
Cardiology Office Note   Date:  06/07/2015   ID:  Berwyn, Bigley [birth year], MRN [id number]  PCP:  Lupita Raider, MD  Cardiologist:  Lesleigh Noe, MD   Chief Complaint  Patient presents with  . Coronary Artery Disease  . Congestive Heart Failure      History of Present Illness: Ethan Vargas is a 75 y.o. male who presents for combined systolic and diastolic heart failure, hypertension, carotid artery disease, chronic kidney disease, and other PAD. He did have postoperative atrial fibrillation.  Recent right carotid endarterectomy. This is left him with right facial droop. No cardiovascular complications during the procedure otherwise. No heart failure or anginal complaints. He does have intermittent dyspnea and lower extremity swelling. He has stopped weighing himself. He has not had syncope or prolonged palpitations.  Past Medical History  Diagnosis Date  . Sleep apnea   . Diabetes mellitus     a. c/b neuropathy, nephropathy.  . Ischemic cardiomyopathy   . Hypertension   . Anemia of chronic disease   . Chronic systolic CHF (congestive heart failure) (HCC)   . Peripheral vascular disease (HCC)   . Cellulitis Dec./Jan 2011 and 2012  . CKD (chronic kidney disease), stage III   . Hyperlipidemia   . Barrett's esophageal ulceration   . Renal artery stenosis (HCC)     a. s/p stent 2007.  . Right-sided carotid artery disease (HCC)   . Atrial fibrillation (HCC)   . Myocardial infarction [facility]) Feb. 2003    Heart Attack  . PONV (postoperative nausea and vomiting)   . CAD (coronary artery disease)     a. Remote CABG 1998. b. Inf MI 2003 s/ DES to SVG-RCA. c. Additional stenting in 2010 - occluded SVG to RCA and DES to SVG-Diag#1. d. Nuc 07/2013 with Intermediate risk stress nuclear study.  There is a medium size defect of moderate severity affecting the apical anterior, mid anterior and mid anteroseptal segments. There is global hypokinesis.  EF 28%.  . Shortness  of breath dyspnea     WITH EXERTION  . Pneumonia     TWICE IN LAST 5 YRS  . History of hiatal hernia     REFLUX  . Arthritis     Past Surgical History  Procedure Laterality Date  . Coronary artery bypass graft  Sept. 1998  . Arterial thrombectomy  2003    X's 2 stents  . Stents surgery  2007  and  2010    Kidney  ( 2007 ) and  Heart ( Sept. 1998 )  . Knee surgery  1961    Right knee  . Urethral dilation      1984  . Endarterectomy Right 02/25/2015    Procedure: RIGHT CAROTID ARTERY ENDARTERECTOMY ;  Surgeon: Nada Libman, MD;  Location: [facility] OR;  Service: Vascular;  Laterality: Right;  . Patch angioplasty Right 02/25/2015    Procedure: WITH Livia Snellen BOVINE PATCH ANGIOPLASTY;  Surgeon: Nada Libman, MD;  Location: [facility] OR;  Service: Vascular;  Laterality: Right;  . Carotid endarterectomy Right 02-25-15    CEA     Current Outpatient Prescriptions  Medication Sig Dispense Refill  . acetaminophen (TYLENOL) 325 MG tablet Take 325 mg by mouth 2 (two) times daily.    Marland Kitchen aspirin EC 81 MG tablet Take 81 mg by mouth daily.    Marland Kitchen azelastine (OPTIVAR) 0.05 % ophthalmic solution Place 1 drop into both eyes daily as needed (ALLERGIES AND DRY EYES). To affected  eyes     . BD PEN NEEDLE NANO U/F 32G X 4 MM MISC     . calcitRIOL (ROCALTROL) 0.25 MCG capsule Take one (1) capsule (0.25 mcg total) by mouth on Mondays, Wednesdays, and Fridays.    . carvedilol (COREG) 12.5 MG tablet TAKE 1 TABLET TWICE DAILY. 60 tablet 2  . CORDRAN 4 MCG/SQCM TAPE as needed (FOR DRY CRACKED HANDS). Apply to hands    . doxazosin (CARDURA) 2 MG tablet Take 1 tablet (2 mg total) by mouth daily. 30 tablet 11  . ferrous sulfate 325 (65 FE) MG tablet Take 325 mg by mouth daily.     . fluticasone (FLONASE) 50 MCG/ACT nasal spray Place 1 spray into both nostrils 2 (two) times daily.    . furosemide (LASIX) 80 MG tablet Take one (1) tablet (80 mg total) by mouth daily. Take an additional one (1) tablet (80 mg total) by mouth  daily as needed for swelling.    . isosorbide mononitrate (IMDUR) 30 MG 24 hr tablet TAKE 1/2 TABLET EVERY DAY. 15 tablet 3  . latanoprost (XALATAN) 0.005 % ophthalmic solution Place 1 drop into both eyes at bedtime.    Marland Kitchen. LEVEMIR FLEXTOUCH 100 UNIT/ML Pen Inject 34 Units into the skin daily.     Marland Kitchen. lisinopril (PRINIVIL,ZESTRIL) 20 MG tablet Take 20 mg by mouth daily.    . montelukast (SINGULAIR) 10 MG tablet Take 10 mg by mouth daily.     Marland Kitchen. NITROSTAT 0.4 MG SL tablet 1 TABLET UNDER TONGUE AS NEEDED. MAY RPT. EVERY 5 MIN. UP TO 3 TIMES.CALL 911 IF NOT FULLY RELIEVED 25 tablet 1  . PARoxetine (PAXIL) 20 MG tablet Take 20 mg by mouth daily.     . rosuvastatin (CRESTOR) 10 MG tablet Take 10 mg by mouth daily.      Marland Kitchen. testosterone cypionate (DEPOTESTOTERONE CYPIONATE) 200 MG/ML injection every 21 ( twenty-one) days.    . TRADJENTA 5 MG TABS tablet Take 5 mg by mouth daily.    . vitamin B-12 (CYANOCOBALAMIN) 1000 MCG tablet Take 1,000 mcg by mouth daily.     No current facility-administered medications for this visit.    Allergies:   Contrast media; Iodine; Mercury; Clopidogrel bisulfate; and Sulfonamide derivatives    Social History:  The patient  reports that he has never smoked. He has never used smokeless tobacco. He reports that he does not drink alcohol or use illicit drugs.   Family History:  The patient's family history includes Deep vein thrombosis in his brother, mother, and sister; Diabetes in his brother; Heart attack in his father; Heart disease in his brother, brother, and father; Hypertension in his brother, brother, and mother. There is no history of Stroke.    ROS:  Please see the history of present illness.   Otherwise, review of systems are positive for lower extremity swelling shortness of breath when lying down cough intermittent. He has been given permission by Dr Marisue HumbleSanford of nephrology is extra Lasix if needed.   All other systems are reviewed and negative.    PHYSICAL  EXAM: VS:  BP 124/64 mmHg  Pulse 71  Ht 5\' 7"  (1.702 m)  Wt 153 lb 1.9 oz (69.455 kg)  BMI 23.98 kg/m2 , BMI Body mass index is 23.98 kg/(m^2). GEN: Well nourished, well developed, in no acute distress HEENT: normal Neck: no JVD, carotid bruits, or masses Cardiac: RRR.  There is no murmur, rub, or gallop. There is 1-2+ right and 1+ left lower extremity  edema. Respiratory:  clear to auscultation bilaterally, normal work of breathing. GI: soft, nontender, nondistended, + BS MS: no deformity or atrophy Skin: warm and dry, no rash Neuro:  Strength and sensation are intact Psych: euthymic mood, full affect   EKG:  EKG is not ordered today.    Recent Labs: 02/21/2015: ALT 21 02/26/2015: Hemoglobin 10.5*; Platelets 86* 03/01/2015: BUN 50*; Creatinine, Ser 2.36*; Potassium 4.2; Sodium 135    Lipid Panel    Component Value Date/Time   CHOL 113 02/26/2014 0854   TRIG 83.0 02/26/2014 0854   HDL 26.50* 02/26/2014 0854   CHOLHDL 4 02/26/2014 0854   VLDL 16.6 02/26/2014 0854   LDLCALC 70 02/26/2014 0854      Wt Readings from Last 3 Encounters:  06/07/15 153 lb 1.9 oz (69.455 kg)  03/14/15 153 lb (69.4 kg)  02/25/15 149 lb 4 oz (67.7 kg)      Other studies Reviewed: Additional studies/ records that were reviewed today include: Viewed vascular surgery, nephrology, and other electronic chart records.. The findings include confirmed that he had known neurological deficit post right carotid endarterectomy.    ASSESSMENT AND PLAN:  1. Essential hypertension, benign Excellent control  2. Chronic combined systolic and diastolic heart failure (HCC) Evidence of mild volume overload with lower extremity edema and elevated neck veins. Also believed that his weight is up slightly today.  3. CKD (chronic kidney disease), stage III Stable and followed by nephrology  4. Obstructive sleep apnea Wears C Pap intermittently  5.. Occlusion of right carotid artery Status post  endarterectomy with residual right facial numbness and facial droop.    Current medicines are reviewed at length with the patient today.  The patient has the following concerns regarding medicines: He wants my opinion on whether he can take more Lasix from time to time.  The following changes/actions have been instituted:    I have asked him to resume weighing on a daily basis. We will need to establish a dry weight.  After dry weight is established, we will have a sliding scale furosemide process.  He seems now to understand the need for daily weights.  Labs/ tests ordered today include:  No orders of the defined types were placed in this encounter.     Disposition:   FU with HS in 8 months  Signed, Lesleigh Noe, MD  06/07/2015 1:43 PM    [facility] [address] [place], [place], [place]  [address] Phone: [phone]; Fax: [phone]

## 2015-06-07 NOTE — Patient Instructions (Signed)
Medication Instructions:  Your physician recommends that you continue on your current medications as directed. Please refer to the Current Medication list given to you today.   If your weight is up 3lbs or greater ok to take an additional Lasix up to 3 days. If you are still not down to your baseline weight please call the office  Labwork: None ordered  Testing/Procedures: None ordered  Follow-Up: Your physician wants you to follow-up in: 6 months You will receive a reminder letter in the mail two months in advance. If you don't receive a letter, please call our office to schedule the follow-up appointment.   Any Other Special Instructions Will Be Listed Below (If Applicable). Please purchase a scale.Your physician recommends that you weigh, daily, at the same time every day, and in the same amount of clothing. Please record your daily weights.  Please call with your baseline weight       If you need a refill on your cardiac medications before your next appointment, please call your pharmacy.

## 2015-07-12 ENCOUNTER — Telehealth: Payer: Self-pay | Admitting: Interventional Cardiology

## 2015-07-12 DIAGNOSIS — I5042 Chronic combined systolic (congestive) and diastolic (congestive) heart failure: Secondary | ICD-10-CM

## 2015-07-12 MED ORDER — METOLAZONE 2.5 MG PO TABS: ORAL_TABLET | ORAL | Status: DC

## 2015-07-12 NOTE — Telephone Encounter (Signed)
Zaroxolyn (Metolazone), 2.5 mg ASAP and 30-60 minutes later Furosemide 80 mg po, Report response in AM.

## 2015-07-12 NOTE — Telephone Encounter (Signed)
Left message with pt secretary for him to call back

## 2015-07-12 NOTE — Telephone Encounter (Signed)
Pt says he is taking 2 Furosemide and it is not working. He says he is also short of breath.He was on Spironolactone,but he was taken off of that because of his kidney. He felt like that helped more than the Furosemide. He will be back in his office between 1:30 and 4:00.

## 2015-07-12 NOTE — Telephone Encounter (Signed)
Returned pt call. Pt sts that his weight [place] up 10lb from his baseline weight, and he is having increase sob and bilateral le swelling. Pt has been weighing daily. Pt has been taking the sliding scale of lasix as instructed.  qd and extra  if up 3lbs for 3 days. Pt reports that he has been taking add lasix frequently. He been taking  daily the extra lasix 6 hours apart He feels that lasix has not been effective, he has been consistently aging weight, he notices minimal increase of urine output the days he takes add lasix. Pt sts that he was previously on spironolactone and felt it worked better. Spironolactone was d/c during pt last hospitalization due to his kidney function.adv pt that I will fwd the message to Dr.Smith, who will be in the office tomorrow and call back with his recommendation. Pt agreeable and verbalized understanding l

## 2015-07-12 NOTE — Telephone Encounter (Signed)
Pt aware of Dr.Smith's recommendation. Zaroxolyn (Metolazone), 2.5 mg ASAP and 30-60 minutes later Furosemide 80 mg po, Report response in AM. Pt verbalized understanding.  Rx sent to pt pharmacy [facility]. Pt will call the office in the morning with an update

## 2015-07-13 MED ORDER — FUROSEMIDE 80 MG PO TABS: 80.0000 mg | ORAL_TABLET | Freq: Two times a day (BID) | ORAL | Status: DC

## 2015-07-13 NOTE — Telephone Encounter (Signed)
Pt called in to give update on his weight and symptoms. Pt sts that he has had increased urine output with his 1 metolazone dose. Pt weight this morning was 148.2 down 3.8lb. Pt sts that his baseline weight is 145lb. He was 152lb yesterday. Pt sts he has some improvement in sob , and le swelling. Adv pt that I will update Dr.Smith and call back with his recommendation. Pt verbalized understanding.   Pt aware of Dr. Michaelle Copas recommendation. Increase lasix to  bid, to be taken 8 hours apart Take an add Metolazone this evening, 30-60 min before lasix. Continue to weight daily. bmet on 2/17, repeat bmet on 2/24. Pt rqst Rx for Lasix. Sent to pt pharmacy. Pt verbalized understanding to all instruction given.

## 2015-07-13 NOTE — Telephone Encounter (Signed)
He is calling to give you an update

## 2015-07-15 ENCOUNTER — Other Ambulatory Visit: Payer: Managed Care, Other (non HMO)

## 2015-07-18 ENCOUNTER — Other Ambulatory Visit (INDEPENDENT_AMBULATORY_CARE_PROVIDER_SITE_OTHER): Payer: Managed Care, Other (non HMO) | Admitting: *Deleted

## 2015-07-18 DIAGNOSIS — I5042 Chronic combined systolic (congestive) and diastolic (congestive) heart failure: Secondary | ICD-10-CM

## 2015-07-19 LAB — BASIC METABOLIC PANEL
BUN: 48 mg/dL — ABNORMAL HIGH (ref 7–25)
CALCIUM: 9.3 mg/dL (ref 8.6–10.3)
CO2: 37 mmol/L — AB (ref 20–31)
CREATININE: 2.15 mg/dL — AB (ref 0.70–1.18)
Chloride: 89 mmol/L — ABNORMAL LOW (ref 98–110)
GLUCOSE: 200 mg/dL — AB (ref 65–99)
Potassium: 4 mmol/L (ref 3.5–5.3)
SODIUM: 138 mmol/L (ref 135–146)

## 2015-07-21 ENCOUNTER — Telehealth: Payer: Self-pay | Admitting: Interventional Cardiology

## 2015-07-21 NOTE — Telephone Encounter (Signed)
Pt aware of lab results with verbal understanding Per Dr.Smith Lab tests no pretty good. How his weight and breathing doing. Updated me on the current diuretic regimen which I believe should be 80 mg of furosemide twice a day  Pt verified that he is taking Furosemide  bid. Pt reports that his sob has improved and his LE swelling is much better. His weight is down 12 lbs since his diuretic increase. Pt will return to the office for a repeat bmet on 2/27 Update fwd to Dr.Smith

## 2015-07-21 NOTE — Telephone Encounter (Signed)
Returning your call. °

## 2015-07-22 ENCOUNTER — Other Ambulatory Visit: Payer: Managed Care, Other (non HMO)

## 2015-07-25 ENCOUNTER — Other Ambulatory Visit (INDEPENDENT_AMBULATORY_CARE_PROVIDER_SITE_OTHER): Payer: Managed Care, Other (non HMO) | Admitting: *Deleted

## 2015-07-25 DIAGNOSIS — E785 Hyperlipidemia, unspecified: Secondary | ICD-10-CM

## 2015-07-25 DIAGNOSIS — I10 Essential (primary) hypertension: Secondary | ICD-10-CM

## 2015-07-25 LAB — BASIC METABOLIC PANEL
BUN: 56 mg/dL — AB (ref 7–25)
CHLORIDE: 89 mmol/L — AB (ref 98–110)
CO2: 31 mmol/L (ref 20–31)
Calcium: 9.6 mg/dL (ref 8.6–10.3)
Creat: 2.35 mg/dL — ABNORMAL HIGH (ref 0.70–1.18)
GLUCOSE: 448 mg/dL — AB (ref 65–99)
Potassium: 3.8 mmol/L (ref 3.5–5.3)
Sodium: 133 mmol/L — ABNORMAL LOW (ref 135–146)

## 2015-07-25 NOTE — Addendum Note (Signed)
Addended by: Tonita Phoenix on: 07/25/2015 12:08 PM   Modules accepted: Orders

## 2015-07-25 NOTE — Addendum Note (Signed)
Addended by: Tonita Phoenix on: 07/25/2015 12:09 PM   Modules accepted: Orders

## 2015-07-27 ENCOUNTER — Other Ambulatory Visit: Payer: Self-pay

## 2015-07-27 DIAGNOSIS — I5042 Chronic combined systolic (congestive) and diastolic (congestive) heart failure: Secondary | ICD-10-CM

## 2015-08-09 ENCOUNTER — Other Ambulatory Visit: Payer: Self-pay | Admitting: Interventional Cardiology

## 2015-08-09 NOTE — Telephone Encounter (Signed)
[name], MD at 06/07/2015 1:42 PM  carvedilol (COREG) 12.5 MG tabletTAKE 1 TABLET TWICE DAILY isosorbide mononitrate (IMDUR) 30 MG 24 hr tablet TAKE 1/2 TABLET EVERY DAY Current medicines are reviewed at length with the patient today. The patient has the following concerns regarding medicines: He wants my opinion on whether he can take more Lasix from time to time.  The following changes/actions have been instituted:   I have asked him to resume weighing on a daily basis. We will need to establish a dry weight.  After dry weight is established, we will have a sliding scale furosemide process.  He seems now to understand the need for daily weights.   Patient Instructions     Medication Instructions:  Your physician recommends that you continue on your current medications as directed. Please refer to the Current Medication list given to you today.

## 2015-09-08 ENCOUNTER — Other Ambulatory Visit: Payer: Self-pay | Admitting: *Deleted

## 2015-09-08 NOTE — Telephone Encounter (Signed)
Patient requesting for this to be refilled. Is this ok?

## 2015-09-08 NOTE — Telephone Encounter (Signed)
Refill rqst for Zaroxolyn fwd to Dr.Smith for approval

## 2015-09-09 MED ORDER — METOLAZONE 2.5 MG PO TABS: ORAL_TABLET | ORAL | Status: DC

## 2015-09-13 ENCOUNTER — Other Ambulatory Visit (INDEPENDENT_AMBULATORY_CARE_PROVIDER_SITE_OTHER): Payer: Managed Care, Other (non HMO) | Admitting: *Deleted

## 2015-09-13 DIAGNOSIS — I5042 Chronic combined systolic (congestive) and diastolic (congestive) heart failure: Secondary | ICD-10-CM

## 2015-09-13 LAB — BASIC METABOLIC PANEL
BUN: 42 mg/dL — ABNORMAL HIGH (ref 7–25)
CALCIUM: 9.6 mg/dL (ref 8.6–10.3)
CHLORIDE: 94 mmol/L — AB (ref 98–110)
CO2: 37 mmol/L — ABNORMAL HIGH (ref 20–31)
CREATININE: 1.91 mg/dL — AB (ref 0.70–1.18)
GLUCOSE: 173 mg/dL — AB (ref 65–99)
Potassium: 3.7 mmol/L (ref 3.5–5.3)
Sodium: 141 mmol/L (ref 135–146)

## 2015-09-14 ENCOUNTER — Telehealth: Payer: Self-pay

## 2015-09-14 DIAGNOSIS — I5042 Chronic combined systolic (congestive) and diastolic (congestive) heart failure: Secondary | ICD-10-CM

## 2015-09-14 NOTE — Telephone Encounter (Signed)
-----   Message from [name], MD sent at 09/13/2015  4:21 PM EDT ----- The potassium and kidney function is stable. Has he gained weight? What is his weight?

## 2015-09-14 NOTE — Telephone Encounter (Signed)
Pt aware of lab results with verbal understanding. Pt sts that he has been taking Lasix 80mg  bid as prescribed. He weighs his self every morning. He reports his baseline weight is 145lb. Pt sts that his weight began to creep up, last week his weight was up 7-8lb. He had increased sob and le swelling. He attempted to call the office on more that one occasion to report his weight gain ad report his symptoms. Pt was frustrated with our phone system, the prompts no longer gives an option to leave a message for the nurse. He was able to get through to our refill dept. To rqst a refill of Metolazone. Pt was given a refill on 4/14 of Metolazone 2.5mg  daily #5. He reports taking Metolazone x 3 days. He is down 7lbs, his weight this morning 146lb. His sob and selling has resolved. He would like to know if he can have a larger supply of Metolazone to be used prn for excessive weight gain.  Adv pt that I will fwd an update to Dr.Smith and call back with his response. Pt voiced appreciation for the call and verbalized understanding.

## 2015-09-16 NOTE — Telephone Encounter (Signed)
Yeas, give a total of 30 tablets. Needs BMET whenever we have to remove weight rapidly

## 2015-09-19 MED ORDER — METOLAZONE 2.5 MG PO TABS: ORAL_TABLET | ORAL | Status: DC

## 2015-09-19 NOTE — Telephone Encounter (Signed)
Pt aware of Dr.Smith's response. Ok to give a total of 30 tablets of Metolazone Needs BMET whenever we have to remove weight rapidly. Lab appt scheduled for 4/26 (bmet) Rx sent to pt pharmacy for Metolazone 2.5mg  daily as needed #30 R-0 Pt verbalized understanding.

## 2015-09-21 ENCOUNTER — Other Ambulatory Visit (INDEPENDENT_AMBULATORY_CARE_PROVIDER_SITE_OTHER): Payer: Managed Care, Other (non HMO) | Admitting: *Deleted

## 2015-09-21 DIAGNOSIS — I5042 Chronic combined systolic (congestive) and diastolic (congestive) heart failure: Secondary | ICD-10-CM | POA: Diagnosis not present

## 2015-09-21 LAB — BASIC METABOLIC PANEL
BUN: 49 mg/dL — ABNORMAL HIGH (ref 7–25)
CALCIUM: 9.7 mg/dL (ref 8.6–10.3)
CO2: 33 mmol/L — ABNORMAL HIGH (ref 20–31)
CREATININE: 1.88 mg/dL — AB (ref 0.70–1.18)
Chloride: 97 mmol/L — ABNORMAL LOW (ref 98–110)
Glucose, Bld: 129 mg/dL — ABNORMAL HIGH (ref 65–99)
Potassium: 3.8 mmol/L (ref 3.5–5.3)
SODIUM: 144 mmol/L (ref 135–146)

## 2015-10-26 ENCOUNTER — Encounter: Payer: Self-pay | Admitting: Interventional Cardiology

## 2015-11-01 ENCOUNTER — Other Ambulatory Visit (INDEPENDENT_AMBULATORY_CARE_PROVIDER_SITE_OTHER): Payer: Managed Care, Other (non HMO)

## 2015-11-01 DIAGNOSIS — I5042 Chronic combined systolic (congestive) and diastolic (congestive) heart failure: Secondary | ICD-10-CM

## 2015-11-01 LAB — BASIC METABOLIC PANEL
BUN: 41 mg/dL — ABNORMAL HIGH (ref 7–25)
CALCIUM: 9.8 mg/dL (ref 8.6–10.3)
CO2: 31 mmol/L (ref 20–31)
CREATININE: 2.19 mg/dL — AB (ref 0.70–1.18)
Chloride: 97 mmol/L — ABNORMAL LOW (ref 98–110)
GLUCOSE: 131 mg/dL — AB (ref 65–99)
Potassium: 3.6 mmol/L (ref 3.5–5.3)
Sodium: 142 mmol/L (ref 135–146)

## 2015-11-21 ENCOUNTER — Other Ambulatory Visit: Payer: Self-pay | Admitting: Interventional Cardiology

## 2015-12-15 ENCOUNTER — Encounter: Payer: Self-pay | Admitting: Surgery

## 2015-12-26 ENCOUNTER — Ambulatory Visit (HOSPITAL_COMMUNITY)
Admission: RE | Admit: 2015-12-26 | Discharge: 2015-12-26 | Disposition: A | Discharge status: Home / Self Care | Payer: Medicare Other | Source: Ambulatory Visit | Attending: Surgery | Admitting: Surgery

## 2015-12-26 ENCOUNTER — Encounter: Payer: Self-pay | Admitting: Surgery

## 2015-12-26 ENCOUNTER — Ambulatory Visit (INDEPENDENT_AMBULATORY_CARE_PROVIDER_SITE_OTHER): Payer: Medicare Other | Admitting: Surgery

## 2015-12-26 VITALS — BP 100/62 | HR 47 | Temp 97.6°F | Resp 16 | Ht 67.0 in | Wt 147.0 lb

## 2015-12-26 DIAGNOSIS — G4730 Sleep apnea, unspecified: Secondary | ICD-10-CM | POA: Diagnosis not present

## 2015-12-26 DIAGNOSIS — I130 Hypertensive heart and chronic kidney disease with heart failure and stage 1 through stage 4 chronic kidney disease, or unspecified chronic kidney disease: Secondary | ICD-10-CM | POA: Diagnosis not present

## 2015-12-26 DIAGNOSIS — E785 Hyperlipidemia, unspecified: Secondary | ICD-10-CM | POA: Insufficient documentation

## 2015-12-26 DIAGNOSIS — Z9889 Other specified postprocedural states: Secondary | ICD-10-CM

## 2015-12-26 DIAGNOSIS — I5022 Chronic systolic (congestive) heart failure: Secondary | ICD-10-CM | POA: Diagnosis not present

## 2015-12-26 DIAGNOSIS — N183 Chronic kidney disease, stage 3 (moderate): Secondary | ICD-10-CM | POA: Insufficient documentation

## 2015-12-26 DIAGNOSIS — I6521 Occlusion and stenosis of right carotid artery: Secondary | ICD-10-CM | POA: Diagnosis not present

## 2015-12-26 DIAGNOSIS — I2510 Atherosclerotic heart disease of native coronary artery without angina pectoris: Secondary | ICD-10-CM | POA: Diagnosis not present

## 2015-12-26 DIAGNOSIS — E1122 Type 2 diabetes mellitus with diabetic chronic kidney disease: Secondary | ICD-10-CM | POA: Diagnosis not present

## 2015-12-26 NOTE — Progress Notes (Signed)
Referring Physician: Shaw, Kimberlee, MD  Patient name: Ethan Vargas MRN: [id number] 1941/02/28 Sex: male    HPI: Ethan Vargas is a 75 y.o. male,  He is here for a follow up visit s/p right CEA on 02/25/15 for asymptomatic carotid artery stenosis.  His hospital course was complicated by acute on chronic renal failure.  He states he is stable CKD stage IV.   Cr 2.19/ BUN 41 11/01/2015.   He does still have a facial droop that developed after surgery.   Other medical problems include HTN managed with carvedilol, Imdur, and they have restarted his Lisinopril.  At times he has to take extra Lasix and Metolazone if he gets SOB and edema with weight gain per his cardiologist.    Past Medical History:  Diagnosis Date  . Anemia of chronic disease   . Arthritis   . Atrial fibrillation (HCC)   . Barrett's esophageal ulceration   . CAD (coronary artery disease)    a. Remote CABG 1998. b. Inf MI 2003 s/ DES to SVG-RCA. c. Additional stenting in 2010 - occluded SVG to RCA and DES to SVG-Diag#1. d. Nuc 07/2013 with Intermediate risk stress nuclear study.  There is a medium size defect of moderate severity affecting the apical anterior, mid anterior and mid anteroseptal segments. There is global hypokinesis.  EF 28%.  . Cellulitis Dec./Jan 2011 and 2012  . Chronic systolic CHF (congestive heart failure) (HCC)   . CKD (chronic kidney disease), stage III   . Diabetes mellitus    a. c/b neuropathy, nephropathy.  . History of hiatal hernia    REFLUX  . Hyperlipidemia   . Hypertension   . Ischemic cardiomyopathy   . Myocardial infarction [facility]) Feb. 2003   Heart Attack  . Peripheral vascular disease (HCC)   . Pneumonia    TWICE IN LAST 5 YRS  . PONV (postoperative nausea and vomiting)   . Renal artery stenosis (HCC)    a. s/p stent 2007.  . Right-sided carotid artery disease (HCC)   . Shortness of breath dyspnea    WITH EXERTION  . Sleep apnea    Past Surgical History:  Procedure  Laterality Date  . ARTERIAL THROMBECTOMY  2003   X's 2 stents  . CAROTID ENDARTERECTOMY Right 02-25-15   CEA  . CORONARY ARTERY BYPASS GRAFT  Sept. 1998  . ENDARTERECTOMY Right 02/25/2015   Procedure: RIGHT CAROTID ARTERY ENDARTERECTOMY ;  Surgeon: Nada Libman, MD;  Location: [facility] OR;  Service: Vascular;  Laterality: Right;  . KNEE SURGERY  1961   Right knee  . PATCH ANGIOPLASTY Right 02/25/2015   Procedure: WITH XENOSURE BOVINE PATCH ANGIOPLASTY;  Surgeon: Nada Libman, MD;  Location: [facility] OR;  Service: Vascular;  Laterality: Right;  . Stents surgery  2007  and  2010   Kidney  ( 2007 ) and  Heart ( Sept. 1998 )  . URETHRAL DILATION     1984    Family History  Problem Relation Age of Onset  . Hypertension Mother   . Deep vein thrombosis Mother   . Heart disease Father     Heart Disease before age 36  . Heart attack Father   . Deep vein thrombosis Sister     Amputation  . Diabetes Brother   . Heart disease Brother     Anerysm stomach  . Hypertension Brother   . Deep vein thrombosis Brother     Amputation  . Heart disease Brother  Heart disease before age 56  . Hypertension Brother   . Stroke Neg Hx     SOCIAL HISTORY: Social History   Social History  . Marital status: Married    Spouse name: N/A  . Number of children: N/A  . Years of education: N/A   Occupational History  . Not on file.   Social History Main Topics  . Smoking status: Never Smoker  . Smokeless tobacco: Never Used  . Alcohol use No  . Drug use: No  . Sexual activity: Not on file   Other Topics Concern  . Not on file   Social History Narrative   Lives in [place] with spouse.  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    Allergies  Allergen Reactions  . Contrast Media [Iodinated Diagnostic Agents] Swelling    Premedication with prednisone-  Eyes closed from swelling  . Iodine Swelling  . Mercury Swelling  . Clopidogrel Bisulfate Rash    Plavix  .  Sulfonamide Derivatives Other (See Comments)    unknown    Current Outpatient Prescriptions  Medication Sig Dispense Refill  . acetaminophen (TYLENOL) 325 MG tablet Take 325 mg by mouth 2 (two) times daily.    Marland Kitchen aspirin EC 81 MG tablet Take 81 mg by mouth daily.    Marland Kitchen azelastine (OPTIVAR) 0.05 % ophthalmic solution Place 1 drop into both eyes daily as needed (ALLERGIES AND DRY EYES). To affected eyes     . BD PEN NEEDLE NANO U/F 32G X 4 MM MISC     . carvedilol (COREG) 12.5 MG tablet TAKE 1 TABLET TWICE DAILY. 60 tablet 2  . cholecalciferol (VITAMIN D) 400 units TABS tablet Take 400 Units by mouth.    . CORDRAN 4 MCG/SQCM TAPE as needed (FOR DRY CRACKED HANDS). Apply to hands    . ferrous sulfate 325 (65 FE) MG tablet Take 325 mg by mouth daily.     . fluticasone (FLONASE) 50 MCG/ACT nasal spray Place 1 spray into both nostrils 2 (two) times daily.    . furosemide (LASIX) 80 MG tablet Take 1 tablet (80 mg total) by mouth 2 (two) times daily. 180 tablet 1  . isosorbide mononitrate (IMDUR) 30 MG 24 hr tablet TAKE 1/2 TABLET EVERY DAY. 15 tablet 2  . latanoprost (XALATAN) 0.005 % ophthalmic solution Place 1 drop into both eyes at bedtime.    . lipase/protease/amylase (CREON) 12000 units CPEP capsule Take 36,000 Units by mouth.    Marland Kitchen lisinopril (PRINIVIL,ZESTRIL) 20 MG tablet Take 20 mg by mouth daily.    . metolazone (ZAROXOLYN) 2.5 MG tablet Take 1 tablet as directed 30-60 minutes before taking Lasix 30 tablet 0  . montelukast (SINGULAIR) 10 MG tablet Take 10 mg by mouth daily.     Marland Kitchen NITROSTAT 0.4 MG SL tablet 1 TABLET UNDER TONGUE AS NEEDED. MAY RPT. EVERY 5 MIN. UP TO 3 TIMES.CALL 911 IF NOT FULLY RELIEVED 25 tablet 1  . PARoxetine (PAXIL) 20 MG tablet Take 20 mg by mouth daily.     . rosuvastatin (CRESTOR) 10 MG tablet Take 10 mg by mouth daily.      Marland Kitchen testosterone cypionate (DEPOTESTOTERONE CYPIONATE) 200 MG/ML injection every 21 ( twenty-one) days.    . TRADJENTA 5 MG TABS tablet Take 5  mg by mouth daily.    . vitamin B-12 (CYANOCOBALAMIN) 1000 MCG tablet Take 1,000 mcg by mouth daily.    . calcitRIOL (ROCALTROL) 0.25 MCG capsule Take one (1) capsule (0.25 mcg  total) by mouth on Mondays, Wednesdays, and Fridays.    Marland Kitchen doxazosin (CARDURA) 2 MG tablet Take 1 tablet (2 mg total) by mouth daily. (Patient not taking: Reported on 12/26/2015) 30 tablet 11  . LEVEMIR FLEXTOUCH 100 UNIT/ML Pen Inject 34 Units into the skin daily.      No current facility-administered medications for this visit.     ROS:   General:  No weight loss, Fever, chills  HEENT: No recent headaches, no nasal bleeding, no visual changes, no sore throat  Neurologic: No dizziness, blackouts, seizures. No recent symptoms of stroke or mini- stroke. No recent episodes of slurred speech, or temporary blindness.  Cardiac: No recent episodes of chest pain/pressure, no shortness of breath at rest.  No shortness of breath with exertion.  Denies history of atrial fibrillation or irregular heartbeat  Vascular: No history of rest pain in feet.  No history of claudication.  No history of non-healing ulcer, No history of DVT   Pulmonary: No home oxygen, no productive cough, no hemoptysis,  No asthma or wheezing  Musculoskeletal:  [x ] Arthritis, [ ]  Low back pain,  [ ]  Joint pain  Hematologic:No history of hypercoagulable state.  No history of easy bleeding.  No history of anemia  Gastrointestinal: No hematochezia or melena,  No gastroesophageal reflux, no trouble swallowing  Urinary: [x ] chronic Kidney disease, [ ]  on HD - [ ]  MWF or [ ]  TTHS, [ ]  Burning with urination, [ ]  Frequent urination, [ ]  Difficulty urinating;   Skin: No rashes  Psychological: No history of anxiety,  No history of depression   Physical Examination  Vitals:   12/26/15 1540 12/26/15 1544  BP: (!) 80/60 100/62  Pulse: (!) 47   Resp: 16   Temp: 97.6 F (36.4 C)   TempSrc: Oral   SpO2: 93%   Weight: 147 lb (66.7 kg)   Height: 5'  7" (1.702 m)     Body mass index is 23.02 kg/m.  General:  Alert and oriented, no acute distress HEENT: Normal Neck: No bruit or JVD Pulmonary: Clear to auscultation bilaterally Cardiac: Regular Rate and Rhythm without murmur Abdomen: Soft, non-tender, non-distended, no mass, no scars Skin: No rash, well healed right neck incision Extremity Pulses:  2+ radial, brachial, femoral, dorsalis pedis, posterior tibial pulses bilaterally Musculoskeletal: No deformity or edema  Neurologic: Upper and lower extremity motor 5/5 and symmetric  DATA:  Bilateral carotid ICA < 40% stenosis   ASSESSMENT:   Stable patent right CEA Right facial droop   PLAN:   He will follow up in 1 year for repeat carotid duplex Bil.  We discussed the facial droop it may or may not go away at this point.  Usually at the 1 year mark post-op is when you will see the final healing/recovery after surgery.     COLLINS, EMMA MAUREEN PA-C [facility]  The patient was seen  In conjunction with Dr. Myra Gianotti  VASCULAR QUALITY INITIATIVE FOLLOW UP DATA:  Current smoker: [  ] yes  [ x ] no  Living status: [x]   Home  [  ] Nursing home  [  ] Homeless    MEDS:  ASA [ x ] yes  [  ] no- [  ] medical reason  [  ] non compliant  STATIN  [ x ] yes  [  ] no- [  ] medical reason  [  ] non compliant  Beta blocker [ x ]  yes  [  ] no- [  ] medical reason  [  ] non compliant  ACE inhibitor [x  ] yes  [  ] no- [  ] medical reason  [  ] non compliant  P2Y12 Antagonist [ x ] none  [  ] clopidogrel-Plavix  [  ] ticlopidine-Ticlid   [  ] prasugrel-Effient  [  ] ticagrelor- Brilinta    Anticoagulant [x  ] None  [  ] warfarin  [  ] rivaroxaban-Xarelto [  ] dabigatran- Pradaxa  Neurologic event since D/C:  [ x ] no  [  ] yes: [  ] eye event  [  ] cortical event  [  ] VB event  [  ] non specific event  [  ] right  [  ] left  [  ] TIA  [  ] stroke  Date:   Modified Rankin Score: 0  MI since D/C:  [ x ] no  [  ] troponin only  [  ] EKG or clinical  Cranial nerve injury: [  ] none  [  ] resolved  [x  ] persistent  Duplex CEA site: [  ] no  [ x ] yes - PSV= 82  EDV= 16  ICA/CCA ratio:   Stenosis= [ x ] <40% [  ] 40-59% [  ] 60-79%  [  ] > 80%  [  ]  Occluded  CEA site re-operation:  [x  ] no   [  ] yes- date of re-op:  CEA site PCI:   [ x ] no   [  ] yes- date of PCI:   I agree with the above.  I have seen and evaluated the patient.  He is status post right carotid endarterectomy on 02/25/2015 for asymptomatic stenosis.  He reports no new neurologic symptoms.  His carotid duplex today shows widely patent endarterectomy site with no significant left-sided stenosis.  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  He is tolerating this without mores.  I have scheduled for follow-up with a repeat carotid Doppler study in one year.  Durene Cal

## 2016-01-28 ENCOUNTER — Other Ambulatory Visit: Payer: Self-pay | Admitting: Interventional Cardiology

## 2016-01-28 DIAGNOSIS — I5042 Chronic combined systolic (congestive) and diastolic (congestive) heart failure: Secondary | ICD-10-CM

## 2016-02-22 ENCOUNTER — Other Ambulatory Visit: Payer: Medicare Other | Admitting: *Deleted

## 2016-02-22 ENCOUNTER — Other Ambulatory Visit: Payer: Self-pay

## 2016-02-22 DIAGNOSIS — I5042 Chronic combined systolic (congestive) and diastolic (congestive) heart failure: Secondary | ICD-10-CM

## 2016-02-22 LAB — BASIC METABOLIC PANEL
BUN: 55 mg/dL — ABNORMAL HIGH (ref 7–25)
CALCIUM: 9.4 mg/dL (ref 8.6–10.3)
CO2: 29 mmol/L (ref 20–31)
CREATININE: 2.06 mg/dL — AB (ref 0.70–1.18)
Chloride: 102 mmol/L (ref 98–110)
GLUCOSE: 73 mg/dL (ref 65–99)
Potassium: 3.7 mmol/L (ref 3.5–5.3)
Sodium: 142 mmol/L (ref 135–146)

## 2016-02-23 ENCOUNTER — Ambulatory Visit (INDEPENDENT_AMBULATORY_CARE_PROVIDER_SITE_OTHER): Payer: Medicare Other | Admitting: Interventional Cardiology

## 2016-02-23 ENCOUNTER — Encounter: Payer: Self-pay | Admitting: Interventional Cardiology

## 2016-02-23 VITALS — BP 134/76 | HR 53 | Ht 67.0 in | Wt 156.1 lb

## 2016-02-23 DIAGNOSIS — I272 Other secondary pulmonary hypertension: Secondary | ICD-10-CM

## 2016-02-23 DIAGNOSIS — I5042 Chronic combined systolic (congestive) and diastolic (congestive) heart failure: Secondary | ICD-10-CM | POA: Diagnosis not present

## 2016-02-23 DIAGNOSIS — I10 Essential (primary) hypertension: Secondary | ICD-10-CM

## 2016-02-23 DIAGNOSIS — G4733 Obstructive sleep apnea (adult) (pediatric): Secondary | ICD-10-CM

## 2016-02-23 DIAGNOSIS — I2510 Atherosclerotic heart disease of native coronary artery without angina pectoris: Secondary | ICD-10-CM | POA: Diagnosis not present

## 2016-02-23 DIAGNOSIS — I50810 Right heart failure, unspecified: Secondary | ICD-10-CM

## 2016-02-23 DIAGNOSIS — I2729 Other secondary pulmonary hypertension: Secondary | ICD-10-CM

## 2016-02-23 MED ORDER — SACUBITRIL-VALSARTAN 24-26 MG PO TABS: 1.0000 | ORAL_TABLET | Freq: Two times a day (BID) | ORAL | 3 refills | Status: AC

## 2016-02-23 MED ORDER — SACUBITRIL-VALSARTAN 24-26 MG PO TABS: 1.0000 | ORAL_TABLET | Freq: Two times a day (BID) | ORAL | Status: DC

## 2016-02-23 NOTE — Progress Notes (Signed)
Cardiology Office Note    Date:  02/23/2016   ID:  [name], [name] 1940/12/01, MRN [id number]  PCP:  Lupita Raider, MD  Cardiologist: Lesleigh Noe, MD   Chief Complaint  Patient presents with  . Coronary Artery Disease  . Congestive Heart Failure    History of Present Illness:  Ethan Vargas is a 75 y.o. male who presents for combined systolic and diastolic heart failure, hypertension, carotid artery disease, chronic kidney disease, and other PAD. He did have postoperative atrial fibrillation. 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   He is sleeping in his recliner. His weight on our scales seems relatively stable although he weighs at home and has to increase diuretic regimen when his weight gets above 151 pounds. He is getting close to that.  He has been previously evaluated for AICD by Dr. Johney Frame in 2015. He refused ICD therapy at that time. Echo at that time demonstrated EF in the 35-40% range. We felt that he had pulmonary hypertension and was more right heart failure than left. Sleep apnea is now being treated.  Overall exertional tolerance is decreasing.    Past Medical History:  Diagnosis Date  . Anemia of chronic disease   . Arthritis   . Atrial fibrillation (HCC)   . Barrett's esophageal ulceration   . CAD (coronary artery disease)    a. Remote CABG 1998. b. Inf MI 2003 s/ DES to SVG-RCA. c. Additional stenting in 2010 - occluded SVG to RCA and DES to SVG-Diag#1. d. Nuc 07/2013 with Intermediate risk stress nuclear study.  There is a medium size defect of moderate severity affecting the apical anterior, mid anterior and mid anteroseptal segments. There is global hypokinesis.  EF 28%.  . Cellulitis Dec./Jan 2011 and 2012  . Chronic systolic CHF (congestive heart failure) (HCC)   . CKD (chronic kidney disease), stage III   . Diabetes mellitus    a. c/b neuropathy, nephropathy.  . History of hiatal hernia    REFLUX  . Hyperlipidemia   . Hypertension   . Ischemic  cardiomyopathy   . Myocardial infarction [facility]) Feb. 2003   Heart Attack  . Peripheral vascular disease (HCC)   . Pneumonia    TWICE IN LAST 5 YRS  . PONV (postoperative nausea and vomiting)   . Renal artery stenosis (HCC)    a. s/p stent 2007.  . Right-sided carotid artery disease (HCC)   . Shortness of breath dyspnea    WITH EXERTION  . Sleep apnea     Past Surgical History:  Procedure Laterality Date  . ARTERIAL THROMBECTOMY  2003   X's 2 stents  . CAROTID ENDARTERECTOMY Right 02-25-15   CEA  . CORONARY ARTERY BYPASS GRAFT  Sept. 1998  . ENDARTERECTOMY Right 02/25/2015   Procedure: RIGHT CAROTID ARTERY ENDARTERECTOMY ;  Surgeon: Nada Libman, MD;  Location: [facility] OR;  Service: Vascular;  Laterality: Right;  . KNEE SURGERY  1961   Right knee  . PATCH ANGIOPLASTY Right 02/25/2015   Procedure: WITH XENOSURE BOVINE PATCH ANGIOPLASTY;  Surgeon: Nada Libman, MD;  Location: [facility] OR;  Service: Vascular;  Laterality: Right;  . Stents surgery  2007  and  2010   Kidney  ( 2007 ) and  Heart ( Sept. 1998 )  . URETHRAL DILATION     1984    Current Medications: Outpatient Medications Prior to Visit  Medication Sig Dispense Refill  . acetaminophen (TYLENOL) 325 MG tablet Take 325 mg by  mouth 2 (two) times daily.    Marland Kitchen aspirin EC 81 MG tablet Take 81 mg by mouth daily.    Marland Kitchen azelastine (OPTIVAR) 0.05 % ophthalmic solution Place 1 drop into both eyes daily as needed (ALLERGIES AND DRY EYES). To affected eyes     . BD PEN NEEDLE NANO U/F 32G X 4 MM MISC     . calcitRIOL (ROCALTROL) 0.25 MCG capsule Take one (1) capsule (0.25 mcg total) by mouth on Mondays, Wednesdays, and Fridays.    . cholecalciferol (VITAMIN D) 400 units TABS tablet Take 400 Units by mouth.    . CORDRAN 4 MCG/SQCM TAPE as needed (FOR DRY CRACKED HANDS). Apply to hands    . doxazosin (CARDURA) 2 MG tablet Take 1 tablet (2 mg total) by mouth daily. (Patient not taking: Reported on 12/26/2015) 30 tablet 11  . ferrous sulfate  325 (65 FE) MG tablet Take 325 mg by mouth daily.     . fluticasone (FLONASE) 50 MCG/ACT nasal spray Place 1 spray into both nostrils 2 (two) times daily.    . furosemide (LASIX) 80 MG tablet TAKE 1 TABLET TWICE DAILY. 60 tablet 1  . isosorbide mononitrate (IMDUR) 30 MG 24 hr tablet TAKE 1/2 TABLET EVERY DAY. 15 tablet 2  . latanoprost (XALATAN) 0.005 % ophthalmic solution Place 1 drop into both eyes at bedtime.    Marland Kitchen LEVEMIR FLEXTOUCH 100 UNIT/ML Pen Inject 34 Units into the skin daily.     . lipase/protease/amylase (CREON) 12000 units CPEP capsule Take 36,000 Units by mouth.    Marland Kitchen lisinopril (PRINIVIL,ZESTRIL) 20 MG tablet Take 20 mg by mouth daily.    . metolazone (ZAROXOLYN) 2.5 MG tablet Take 1 tablet as directed 30-60 minutes before taking Lasix 30 tablet 0  . montelukast (SINGULAIR) 10 MG tablet Take 10 mg by mouth daily.     Marland Kitchen NITROSTAT 0.4 MG SL tablet 1 TABLET UNDER TONGUE AS NEEDED. MAY RPT. EVERY 5 MIN. UP TO 3 TIMES.CALL 911 IF NOT FULLY RELIEVED 25 tablet 1  . PARoxetine (PAXIL) 20 MG tablet Take 20 mg by mouth daily.     . rosuvastatin (CRESTOR) 10 MG tablet Take 10 mg by mouth daily.      Marland Kitchen testosterone cypionate (DEPOTESTOTERONE CYPIONATE) 200 MG/ML injection every 21 ( twenty-one) days.    . TRADJENTA 5 MG TABS tablet Take 5 mg by mouth daily.    . vitamin B-12 (CYANOCOBALAMIN) 1000 MCG tablet Take 1,000 mcg by mouth daily.    . carvedilol (COREG) 12.5 MG tablet TAKE 1 TABLET TWICE DAILY. 60 tablet 2   No facility-administered medications prior to visit.      Allergies:   Contrast media [iodinated diagnostic agents]; Iodine; Mercury; Clopidogrel bisulfate; and Sulfonamide derivatives   Social History   Social History  . Marital status: Married    Spouse name: N/A  . Number of children: N/A  . Years of education: N/A   Social History Main Topics  . Smoking status: Never Smoker  . Smokeless tobacco: Never Used  . Alcohol use No  . Drug use: No  . Sexual activity: Not  on file   Other Topics Concern  . Not on file   Social History Narrative   Lives in [place] with spouse.  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     Family History:  The patient's family history includes Deep vein thrombosis in his brother, mother, and sister; Diabetes in his brother; Heart attack in his father; Heart  disease in his brother, brother, and father; Hypertension in his brother, brother, and mother.   ROS:   Please see the history of present illness.    Does have lower extremity swelling, he does wake-up at night short of breath and has noted above is having some orthopnea. Aggressive exertional fatigue.  All other systems reviewed and are negative.   PHYSICAL EXAM:   VS:  There were no vitals taken for this visit.   GEN: Well nourished, well developed, in no acute distress  HEENT: normal  Neck: no JVD, carotid bruits, or masses Cardiac: RRR; no murmurs, rubs, or gallops,no edema  Respiratory:  clear to auscultation bilaterally, normal work of breathing GI: soft, nontender, nondistended, + BS MS: no deformity or atrophy  Skin: warm and dry, no rash Neuro:  Alert and Oriented x 3, Strength and sensation are intact Psych: euthymic mood, full affect  Wt Readings from Last 3 Encounters:  12/26/15 147 lb (66.7 kg)  06/07/15 153 lb 1.9 oz (69.5 kg)  03/14/15 153 lb (69.4 kg)      Studies/Labs Reviewed:   EKG:  EKG  Sinus bradycardia at 53 bpm, interventricular conduction delay/left bundle branch block. Nonspecific T-wave abnormality. First-degree AV block.  Recent Labs: 02/26/2015: Hemoglobin 10.5; Platelets 86 02/22/2016: BUN 55; Creat 2.06; Potassium 3.7; Sodium 142   Lipid Panel    Component Value Date/Time   CHOL 113 02/26/2014 0854   TRIG 83.0 02/26/2014 0854   HDL 26.50 (L) 02/26/2014 0854   CHOLHDL 4 02/26/2014 0854   VLDL 16.6 02/26/2014 0854   LDLCALC 70 02/26/2014 0854    Additional studies/ records that were reviewed  today include:  Last LVEF is 40-45% by echo in October 2015.    ASSESSMENT:    1. Atherosclerosis of native coronary artery of native heart without angina pectoris   2. Essential hypertension   3. Chronic combined systolic and diastolic heart failure (HCC)   4. 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 Obstructive sleep apnea      PLAN:  In order of problems listed above:  1. There are no anginal/ischemic symptoms currently. Continue current medical regimen. 2. Well-controlled blood pressure. Low sodium diet is advocated. 3. Class III CHF on current medical regimen. We will hold lisinopril for 48 hours and star Entresto 24/26 mg twice a day. Basic metabolic panel 1 week later. Will set up an appointment for patient to be seen in the advanced heart failure clinic. We'll also get a 2-D Doppler echocardiogram to update LVEF. With left bundle branch block may need to consider ICD/resynchronization therapy if EF low, although QRS duration is only 146 ms. He will need to follow-up with me in approximately 6 weeks. We will optimize entrust oh therapy.    Medication Adjustments/Labs and Tests Ordered: Current medicines are reviewed at length with the patient today.  Concerns regarding medicines are outlined above.  Medication changes, Labs and Tests ordered today are listed in the Patient Instructions below. There are no Patient Instructions on file for this visit.   Signed, Lesleigh NoeHenry W Alyissa Whidbee III, MD  02/23/2016 8:33 AM    [facility] [address], [place], [place]  [address] Phone: [phone]; Fax: [phone]

## 2016-02-23 NOTE — Patient Instructions (Signed)
Medication Instructions:  1) DISCONTINUE Lisinopril  2) START Entresto 24/26 twice daily on Saturday!  Labwork: Your physician recommends that you return for lab work in: 1 week (BMET)   Testing/Procedures: Your physician has requested that you have an echocardiogram. Echocardiography is a painless test that uses sound waves to create images of your heart. It provides your doctor with information about the size and shape of your heart and how well your heart's chambers and valves are working. This procedure takes approximately one hour. There are no restrictions for this procedure.    Follow-Up: Your physician recommends that you schedule a follow-up appointment in: CHF Clinic as soon as possible  Your physician recommends that you schedule a follow-up appointment in: 6 weeks with Dr. Katrinka BlazingSmith.    Any Other Special Instructions Will Be Listed Below (If Applicable).     If you need a refill on your cardiac medications before your next appointment, please call your pharmacy.

## 2016-02-27 ENCOUNTER — Other Ambulatory Visit: Payer: Self-pay | Admitting: Interventional Cardiology

## 2016-02-29 ENCOUNTER — Encounter: Payer: Self-pay | Admitting: Interventional Cardiology

## 2016-03-03 ENCOUNTER — Inpatient Hospital Stay (HOSPITAL_COMMUNITY)
Admission: EM | Admit: 2016-03-03 | Discharge: 2016-03-28 | DRG: 308 | Disposition: E | Payer: Medicare Other | Attending: Emergency Medicine | Admitting: Emergency Medicine

## 2016-03-03 ENCOUNTER — Encounter (HOSPITAL_COMMUNITY): Admission: EM | Disposition: E | Payer: Self-pay | Attending: Emergency Medicine

## 2016-03-03 ENCOUNTER — Encounter (HOSPITAL_COMMUNITY): Payer: Self-pay

## 2016-03-03 ENCOUNTER — Inpatient Hospital Stay (HOSPITAL_COMMUNITY): Payer: Medicare Other

## 2016-03-03 ENCOUNTER — Ambulatory Visit (HOSPITAL_COMMUNITY): Admit: 2016-03-03 | Payer: Medicare Other | Admitting: Cardiovascular Disease

## 2016-03-03 ENCOUNTER — Emergency Department (HOSPITAL_COMMUNITY): Payer: Medicare Other

## 2016-03-03 DIAGNOSIS — G931 Anoxic brain damage, not elsewhere classified: Secondary | ICD-10-CM | POA: Diagnosis present

## 2016-03-03 DIAGNOSIS — E1151 Type 2 diabetes mellitus with diabetic peripheral angiopathy without gangrene: Secondary | ICD-10-CM | POA: Diagnosis present

## 2016-03-03 DIAGNOSIS — I492 Junctional premature depolarization: Secondary | ICD-10-CM | POA: Diagnosis present

## 2016-03-03 DIAGNOSIS — G4733 Obstructive sleep apnea (adult) (pediatric): Secondary | ICD-10-CM | POA: Diagnosis present

## 2016-03-03 DIAGNOSIS — I25111 Atherosclerotic heart disease of native coronary artery with angina pectoris with documented spasm: Secondary | ICD-10-CM | POA: Diagnosis present

## 2016-03-03 DIAGNOSIS — I255 Ischemic cardiomyopathy: Secondary | ICD-10-CM | POA: Diagnosis present

## 2016-03-03 DIAGNOSIS — Z66 Do not resuscitate: Secondary | ICD-10-CM | POA: Diagnosis present

## 2016-03-03 DIAGNOSIS — R570 Cardiogenic shock: Secondary | ICD-10-CM | POA: Diagnosis not present

## 2016-03-03 DIAGNOSIS — I472 Ventricular tachycardia, unspecified: Secondary | ICD-10-CM

## 2016-03-03 DIAGNOSIS — E872 Acidosis: Secondary | ICD-10-CM | POA: Diagnosis present

## 2016-03-03 DIAGNOSIS — I447 Left bundle-branch block, unspecified: Secondary | ICD-10-CM | POA: Diagnosis present

## 2016-03-03 DIAGNOSIS — Z794 Long term (current) use of insulin: Secondary | ICD-10-CM

## 2016-03-03 DIAGNOSIS — R079 Chest pain, unspecified: Secondary | ICD-10-CM | POA: Diagnosis not present

## 2016-03-03 DIAGNOSIS — I130 Hypertensive heart and chronic kidney disease with heart failure and stage 1 through stage 4 chronic kidney disease, or unspecified chronic kidney disease: Secondary | ICD-10-CM | POA: Diagnosis present

## 2016-03-03 DIAGNOSIS — I420 Dilated cardiomyopathy: Secondary | ICD-10-CM | POA: Clinically undetermined

## 2016-03-03 DIAGNOSIS — J9600 Acute respiratory failure, unspecified whether with hypoxia or hypercapnia: Secondary | ICD-10-CM | POA: Diagnosis not present

## 2016-03-03 DIAGNOSIS — E1122 Type 2 diabetes mellitus with diabetic chronic kidney disease: Secondary | ICD-10-CM | POA: Diagnosis present

## 2016-03-03 DIAGNOSIS — R0902 Hypoxemia: Secondary | ICD-10-CM

## 2016-03-03 DIAGNOSIS — N183 Chronic kidney disease, stage 3 unspecified: Secondary | ICD-10-CM | POA: Diagnosis present

## 2016-03-03 DIAGNOSIS — Z8249 Family history of ischemic heart disease and other diseases of the circulatory system: Secondary | ICD-10-CM

## 2016-03-03 DIAGNOSIS — I5082 Biventricular heart failure: Secondary | ICD-10-CM | POA: Diagnosis present

## 2016-03-03 DIAGNOSIS — I959 Hypotension, unspecified: Secondary | ICD-10-CM | POA: Diagnosis not present

## 2016-03-03 DIAGNOSIS — J9601 Acute respiratory failure with hypoxia: Secondary | ICD-10-CM | POA: Diagnosis present

## 2016-03-03 DIAGNOSIS — I2729 Other secondary pulmonary hypertension: Secondary | ICD-10-CM | POA: Diagnosis present

## 2016-03-03 DIAGNOSIS — E1140 Type 2 diabetes mellitus with diabetic neuropathy, unspecified: Secondary | ICD-10-CM | POA: Diagnosis present

## 2016-03-03 DIAGNOSIS — I469 Cardiac arrest, cause unspecified: Secondary | ICD-10-CM | POA: Diagnosis present

## 2016-03-03 DIAGNOSIS — Z515 Encounter for palliative care: Secondary | ICD-10-CM | POA: Diagnosis present

## 2016-03-03 DIAGNOSIS — I25110 Atherosclerotic heart disease of native coronary artery with unstable angina pectoris: Secondary | ICD-10-CM | POA: Diagnosis not present

## 2016-03-03 DIAGNOSIS — E876 Hypokalemia: Secondary | ICD-10-CM | POA: Diagnosis present

## 2016-03-03 DIAGNOSIS — Z79899 Other long term (current) drug therapy: Secondary | ICD-10-CM

## 2016-03-03 DIAGNOSIS — Z7982 Long term (current) use of aspirin: Secondary | ICD-10-CM

## 2016-03-03 DIAGNOSIS — I252 Old myocardial infarction: Secondary | ICD-10-CM | POA: Diagnosis not present

## 2016-03-03 DIAGNOSIS — I248 Other forms of acute ischemic heart disease: Secondary | ICD-10-CM | POA: Diagnosis present

## 2016-03-03 DIAGNOSIS — Z951 Presence of aortocoronary bypass graft: Secondary | ICD-10-CM

## 2016-03-03 DIAGNOSIS — Z882 Allergy status to sulfonamides status: Secondary | ICD-10-CM

## 2016-03-03 DIAGNOSIS — R778 Other specified abnormalities of plasma proteins: Secondary | ICD-10-CM

## 2016-03-03 DIAGNOSIS — R748 Abnormal levels of other serum enzymes: Secondary | ICD-10-CM | POA: Diagnosis not present

## 2016-03-03 DIAGNOSIS — Z833 Family history of diabetes mellitus: Secondary | ICD-10-CM

## 2016-03-03 DIAGNOSIS — I5042 Chronic combined systolic (congestive) and diastolic (congestive) heart failure: Secondary | ICD-10-CM | POA: Diagnosis present

## 2016-03-03 DIAGNOSIS — E875 Hyperkalemia: Secondary | ICD-10-CM | POA: Diagnosis not present

## 2016-03-03 DIAGNOSIS — R7989 Other specified abnormal findings of blood chemistry: Secondary | ICD-10-CM

## 2016-03-03 DIAGNOSIS — I50810 Right heart failure, unspecified: Secondary | ICD-10-CM | POA: Diagnosis present

## 2016-03-03 DIAGNOSIS — I2510 Atherosclerotic heart disease of native coronary artery without angina pectoris: Secondary | ICD-10-CM | POA: Diagnosis not present

## 2016-03-03 DIAGNOSIS — I4891 Unspecified atrial fibrillation: Secondary | ICD-10-CM | POA: Diagnosis present

## 2016-03-03 DIAGNOSIS — E162 Hypoglycemia, unspecified: Secondary | ICD-10-CM | POA: Diagnosis not present

## 2016-03-03 DIAGNOSIS — Z0189 Encounter for other specified special examinations: Secondary | ICD-10-CM

## 2016-03-03 DIAGNOSIS — Z888 Allergy status to other drugs, medicaments and biological substances status: Secondary | ICD-10-CM

## 2016-03-03 DIAGNOSIS — N184 Chronic kidney disease, stage 4 (severe): Secondary | ICD-10-CM | POA: Diagnosis present

## 2016-03-03 DIAGNOSIS — E785 Hyperlipidemia, unspecified: Secondary | ICD-10-CM | POA: Diagnosis present

## 2016-03-03 DIAGNOSIS — Z978 Presence of other specified devices: Secondary | ICD-10-CM

## 2016-03-03 DIAGNOSIS — Z91041 Radiographic dye allergy status: Secondary | ICD-10-CM

## 2016-03-03 DIAGNOSIS — Z9289 Personal history of other medical treatment: Secondary | ICD-10-CM

## 2016-03-03 LAB — CBC
HCT: 42.1 % (ref 39.0–52.0)
Hemoglobin: 13.2 g/dL (ref 13.0–17.0)
MCH: 30.3 pg (ref 26.0–34.0)
MCHC: 31.4 g/dL (ref 30.0–36.0)
MCV: 96.6 fL (ref 78.0–100.0)
Platelets: 104 10*3/uL — ABNORMAL LOW (ref 150–400)
RBC: 4.36 MIL/uL (ref 4.22–5.81)
RDW: 16.4 % — AB (ref 11.5–15.5)
WBC: 9.7 10*3/uL (ref 4.0–10.5)

## 2016-03-03 LAB — COMPREHENSIVE METABOLIC PANEL
ALT: 47 U/L (ref 17–63)
AST: 71 U/L — ABNORMAL HIGH (ref 15–41)
Albumin: 3.6 g/dL (ref 3.5–5.0)
Alkaline Phosphatase: 66 U/L (ref 38–126)
Anion gap: 11 (ref 5–15)
BUN: 33 mg/dL — ABNORMAL HIGH (ref 6–20)
CO2: 27 mmol/L (ref 22–32)
Calcium: 9.1 mg/dL (ref 8.9–10.3)
Chloride: 103 mmol/L (ref 101–111)
Creatinine, Ser: 2.18 mg/dL — ABNORMAL HIGH (ref 0.61–1.24)
GFR calc Af Amer: 33 mL/min — ABNORMAL LOW (ref >60)
GFR calc non Af Amer: 28 mL/min — ABNORMAL LOW (ref >60)
Glucose, Bld: 172 mg/dL — ABNORMAL HIGH (ref 65–99)
Potassium: 3.3 mmol/L — ABNORMAL LOW (ref 3.5–5.1)
Sodium: 141 mmol/L (ref 135–145)
Total Bilirubin: 0.8 mg/dL (ref 0.3–1.2)
Total Protein: 6.8 g/dL (ref 6.5–8.1)

## 2016-03-03 LAB — MRSA PCR SCREENING: MRSA by PCR: NEGATIVE

## 2016-03-03 LAB — CBC WITH DIFFERENTIAL/PLATELET
Basophils Absolute: 0 10*3/uL (ref 0.0–0.1)
Basophils Relative: 0 %
Eosinophils Absolute: 0.2 10*3/uL (ref 0.0–0.7)
Eosinophils Relative: 2 %
HCT: 45.8 % (ref 39.0–52.0)
Hemoglobin: 14.2 g/dL (ref 13.0–17.0)
Lymphocytes Relative: 54 %
Lymphs Abs: 4.4 10*3/uL — ABNORMAL HIGH (ref 0.7–4.0)
MCH: 30.5 pg (ref 26.0–34.0)
MCHC: 31 g/dL (ref 30.0–36.0)
MCV: 98.5 fL (ref 78.0–100.0)
Monocytes Absolute: 0.3 10*3/uL (ref 0.1–1.0)
Monocytes Relative: 3 %
Neutro Abs: 3.2 10*3/uL (ref 1.7–7.7)
Neutrophils Relative %: 40 %
Platelets: 92 10*3/uL — ABNORMAL LOW (ref 150–400)
RBC: 4.65 MIL/uL (ref 4.22–5.81)
RDW: 16.6 % — ABNORMAL HIGH (ref 11.5–15.5)
WBC: 8 10*3/uL (ref 4.0–10.5)

## 2016-03-03 LAB — I-STAT CHEM 8, ED
BUN: 38 mg/dL — ABNORMAL HIGH (ref 6–20)
Calcium, Ion: 1.07 mmol/L — ABNORMAL LOW (ref 1.15–1.40)
Chloride: 102 mmol/L (ref 101–111)
Creatinine, Ser: 1.9 mg/dL — ABNORMAL HIGH (ref 0.61–1.24)
Glucose, Bld: 160 mg/dL — ABNORMAL HIGH (ref 65–99)
HCT: 46 % (ref 39.0–52.0)
Hemoglobin: 15.6 g/dL (ref 13.0–17.0)
Potassium: 3.3 mmol/L — ABNORMAL LOW (ref 3.5–5.1)
Sodium: 142 mmol/L (ref 135–145)
TCO2: 26 mmol/L (ref 0–100)

## 2016-03-03 LAB — GLUCOSE, CAPILLARY
GLUCOSE-CAPILLARY: 108 mg/dL — AB (ref 65–99)
GLUCOSE-CAPILLARY: 84 mg/dL (ref 65–99)
Glucose-Capillary: 110 mg/dL — ABNORMAL HIGH (ref 65–99)
Glucose-Capillary: 55 mg/dL — ABNORMAL LOW (ref 65–99)
Glucose-Capillary: 60 mg/dL — ABNORMAL LOW (ref 65–99)
Glucose-Capillary: 96 mg/dL (ref 65–99)

## 2016-03-03 LAB — URINALYSIS, ROUTINE W REFLEX MICROSCOPIC
Bilirubin Urine: NEGATIVE
Glucose, UA: NEGATIVE mg/dL
Ketones, ur: NEGATIVE mg/dL
Nitrite: NEGATIVE
Protein, ur: 100 mg/dL — AB
Specific Gravity, Urine: 1.02 (ref 1.005–1.030)
pH: 5.5 (ref 5.0–8.0)

## 2016-03-03 LAB — POCT I-STAT, CHEM 8
BUN: 34 mg/dL — ABNORMAL HIGH (ref 6–20)
BUN: 34 mg/dL — ABNORMAL HIGH (ref 6–20)
CREATININE: 2 mg/dL — AB (ref 0.61–1.24)
CREATININE: 1.8 mg/dL — AB (ref 0.61–1.24)
Calcium, Ion: 1.19 mmol/L (ref 1.15–1.40)
Calcium, Ion: 1.16 mmol/L (ref 1.15–1.40)
Chloride: 104 mmol/L (ref 101–111)
Chloride: 104 mmol/L (ref 101–111)
GLUCOSE: 74 mg/dL (ref 65–99)
GLUCOSE: 111 mg/dL — AB (ref 65–99)
HCT: 42 % (ref 39.0–52.0)
HCT: 47 % (ref 39.0–52.0)
HEMOGLOBIN: 14.3 g/dL (ref 13.0–17.0)
HEMOGLOBIN: 16 g/dL (ref 13.0–17.0)
POTASSIUM: 3.4 mmol/L — AB (ref 3.5–5.1)
Potassium: 3.1 mmol/L — ABNORMAL LOW (ref 3.5–5.1)
Sodium: 144 mmol/L (ref 135–145)
Sodium: 146 mmol/L — ABNORMAL HIGH (ref 135–145)
TCO2: 26 mmol/L (ref 0–100)
TCO2: 24 mmol/L (ref 0–100)

## 2016-03-03 LAB — PROTIME-INR
INR: 1.17
Prothrombin Time: 15 seconds (ref 11.4–15.2)

## 2016-03-03 LAB — I-STAT CG4 LACTIC ACID, ED: Lactic Acid, Venous: 5.9 mmol/L (ref 0.5–1.9)

## 2016-03-03 LAB — URINE MICROSCOPIC-ADD ON

## 2016-03-03 LAB — I-STAT ARTERIAL BLOOD GAS, ED
Bicarbonate: 25.9 mmol/L (ref 20.0–28.0)
O2 SAT: 100 %
PCO2 ART: 49.5 mmHg — AB (ref 32.0–48.0)
TCO2: 27 mmol/L (ref 0–100)
pH, Arterial: 7.327 — ABNORMAL LOW (ref 7.350–7.450)
pO2, Arterial: 279 mmHg — ABNORMAL HIGH (ref 83.0–108.0)

## 2016-03-03 LAB — APTT: aPTT: 29 seconds (ref 24–36)

## 2016-03-03 LAB — I-STAT TROPONIN, ED: Troponin i, poc: 0.11 ng/mL (ref 0.00–0.08)

## 2016-03-03 LAB — TROPONIN I: Troponin I: 0.12 ng/mL (ref <0.03)

## 2016-03-03 LAB — LACTIC ACID, PLASMA
Lactic Acid, Venous: 1.4 mmol/L (ref 0.5–1.9)
Lactic Acid, Venous: 1.3 mmol/L (ref 0.5–1.9)

## 2016-03-03 LAB — CREATININE, SERUM
CREATININE: 2.15 mg/dL — AB (ref 0.61–1.24)
GFR calc Af Amer: 33 mL/min — ABNORMAL LOW (ref >60)
GFR, EST NON AFRICAN AMERICAN: 29 mL/min — AB (ref >60)

## 2016-03-03 LAB — MAGNESIUM
Magnesium: 2.4 mg/dL (ref 1.7–2.4)
Magnesium: 2 mg/dL (ref 1.7–2.4)

## 2016-03-03 SURGERY — CANCELLED PROCEDURE

## 2016-03-03 MED ORDER — FAMOTIDINE IN NACL 20-0.9 MG/50ML-% IV SOLN
20.0000 mg | Freq: Two times a day (BID) | INTRAVENOUS | Status: DC
  Administered 2016-03-03 – 2016-03-06 (×6): 20 mg via INTRAVENOUS
  Filled 2016-03-03 (×6): qty 50

## 2016-03-03 MED ORDER — HEPARIN SODIUM (PORCINE) 5000 UNIT/ML IJ SOLN
5000.0000 [IU] | Freq: Three times a day (TID) | INTRAMUSCULAR | Status: DC
  Administered 2016-03-03 – 2016-03-06 (×9): 5000 [IU] via SUBCUTANEOUS
  Filled 2016-03-03 (×8): qty 1

## 2016-03-03 MED ORDER — ORAL CARE MOUTH RINSE
15.0000 mL | OROMUCOSAL | Status: DC
  Administered 2016-03-03 – 2016-03-06 (×28): 15 mL via OROMUCOSAL

## 2016-03-03 MED ORDER — MIDAZOLAM HCL 2 MG/2ML IJ SOLN
1.0000 mg | Freq: Once | INTRAMUSCULAR | Status: AC
  Administered 2016-03-03: 1 mg via INTRAVENOUS

## 2016-03-03 MED ORDER — SODIUM CHLORIDE 0.9 % IV SOLN
100.0000 ug/h | INTRAVENOUS | Status: DC
  Filled 2016-03-03: qty 50

## 2016-03-03 MED ORDER — FENTANYL CITRATE (PF) 100 MCG/2ML IJ SOLN
50.0000 ug | Freq: Once | INTRAMUSCULAR | Status: AC
  Administered 2016-03-03: 50 ug via INTRAVENOUS

## 2016-03-03 MED ORDER — ARTIFICIAL TEARS OP OINT
1.0000 "application " | TOPICAL_OINTMENT | Freq: Three times a day (TID) | OPHTHALMIC | Status: DC
  Administered 2016-03-03 – 2016-03-05 (×6): 1 via OPHTHALMIC
  Filled 2016-03-03 (×2): qty 3.5

## 2016-03-03 MED ORDER — SODIUM CHLORIDE 0.9 % IV SOLN
2.0000 mg/h | INTRAVENOUS | Status: DC
  Administered 2016-03-03 – 2016-03-04 (×2): 2 mg/h via INTRAVENOUS
  Filled 2016-03-03 (×2): qty 10

## 2016-03-03 MED ORDER — SODIUM CHLORIDE 0.9 % IV SOLN
1.0000 ug/kg/min | INTRAVENOUS | Status: DC
  Administered 2016-03-03: 1 ug/kg/min via INTRAVENOUS
  Administered 2016-03-04: 1.5 ug/kg/min via INTRAVENOUS
  Filled 2016-03-03 (×2): qty 20

## 2016-03-03 MED ORDER — MIDAZOLAM BOLUS VIA INFUSION
1.0000 mg | INTRAVENOUS | Status: DC | PRN
  Administered 2016-03-03: 1 mg/h via INTRAVENOUS
  Filled 2016-03-03: qty 1

## 2016-03-03 MED ORDER — SODIUM CHLORIDE 0.9 % IV SOLN: 100.0000 ug/h | INTRAVENOUS | Status: DC

## 2016-03-03 MED ORDER — SODIUM CHLORIDE 0.9 % IV SOLN
100.0000 ug/h | INTRAVENOUS | Status: DC
  Administered 2016-03-03: 100 ug/h via INTRAVENOUS
  Filled 2016-03-03 (×2): qty 50

## 2016-03-03 MED ORDER — CISATRACURIUM BOLUS VIA INFUSION
0.1000 mg/kg | Freq: Once | INTRAVENOUS | Status: AC
  Administered 2016-03-03: 7.1 mg via INTRAVENOUS
  Filled 2016-03-03: qty 8

## 2016-03-03 MED ORDER — POTASSIUM CHLORIDE 10 MEQ/50ML IV SOLN
10.0000 meq | INTRAVENOUS | Status: AC
  Administered 2016-03-03 – 2016-03-04 (×4): 10 meq via INTRAVENOUS
  Filled 2016-03-03 (×4): qty 50

## 2016-03-03 MED ORDER — SODIUM CHLORIDE 0.9 % IV SOLN
INTRAVENOUS | Status: DC
Start: 2016-03-03 — End: 2016-03-04
  Administered 2016-03-03 – 2016-03-04 (×2): via INTRAVENOUS

## 2016-03-03 MED ORDER — FENTANYL BOLUS VIA INFUSION
25.0000 ug | INTRAVENOUS | Status: DC | PRN
  Administered 2016-03-03 (×2): 25 ug via INTRAVENOUS
  Filled 2016-03-03: qty 25

## 2016-03-03 MED ORDER — ASPIRIN 300 MG RE SUPP
300.0000 mg | RECTAL | Status: AC
  Administered 2016-03-03: 300 mg via RECTAL
  Filled 2016-03-03: qty 1

## 2016-03-03 MED ORDER — CISATRACURIUM BOLUS VIA INFUSION
0.0500 mg/kg | INTRAVENOUS | Status: DC | PRN
  Filled 2016-03-03: qty 4

## 2016-03-03 MED ORDER — INSULIN ASPART 100 UNIT/ML ~~LOC~~ SOLN
2.0000 [IU] | SUBCUTANEOUS | Status: DC
  Administered 2016-03-04 – 2016-03-05 (×3): 2 [IU] via SUBCUTANEOUS
  Administered 2016-03-05: 4 [IU] via SUBCUTANEOUS
  Administered 2016-03-06: 2 [IU] via SUBCUTANEOUS

## 2016-03-03 MED ORDER — CHLORHEXIDINE GLUCONATE 0.12% ORAL RINSE (MEDLINE KIT)
15.0000 mL | Freq: Two times a day (BID) | OROMUCOSAL | Status: DC
  Administered 2016-03-03 – 2016-03-06 (×6): 15 mL via OROMUCOSAL

## 2016-03-03 MED ORDER — DEXTROSE 50 % IV SOLN
INTRAVENOUS | Status: AC
  Administered 2016-03-03: 25 mL
  Filled 2016-03-03: qty 50

## 2016-03-03 NOTE — Procedures (Signed)
Arterial Catheter Insertion Procedure Note Ethan Vargas [id number] 1940-09-05  Procedure: Insertion of Arterial Catheter  Indications: Blood pressure monitoring and Frequent blood sampling  Procedure Details Consent: Unable to obtain consent because of altered level of consciousness. Time Out: Verified patient identification, verified procedure, site/side was marked, verified correct patient position, special equipment/implants available, medications/allergies/relevent history reviewed, required imaging and test results available.  Performed  Maximum sterile technique was used including antiseptics, cap, gloves, gown, hand hygiene, mask and sheet. Skin prep: Chlorhexidine; local anesthetic administered 20 gauge catheter was inserted into right radial artery using the Seldinger technique.  Evaluation Blood flow good; BP tracing good. Complications: No apparent complications.   Epifanio LeschesSarpy, Alyssabeth Bruster A 03/08/2016

## 2016-03-03 NOTE — Progress Notes (Signed)
eLink Physician-Brief Progress Note Patient Name: Ethan Vargas DOB: 1941-05-09 MRN: [id number]   Date of Service  03/20/2016  HPI/Events of Note  K+ = 3.1 and Creatinine = 1.8.  eICU Interventions  Will replace K+.     Intervention Category Intermediate Interventions: Electrolyte abnormality - evaluation and management  [name] 03/05/2016, 9:45 PM

## 2016-03-03 NOTE — ED Provider Notes (Signed)
[facility]-EMERGENCY DEPT Provider Note   CSN: [id number] Arrival date & time: 03/02/2016  1433  By signing my name below, I, Freida Busman, attest that this documentation has been prepared under the direction and in the presence of Raeford Razor, MD . Electronically Signed: Freida Busman, Scribe. 03/23/2016. 2:52 PM.  History   Chief Complaint Chief Complaint  Patient presents with  . Cardiac Arrest    LEVEL 5 CAVEAT DUE TO ACUITY OF MEDICAL CONDITION   The history is provided by the EMS personnel. No language interpreter was used.     HPI Comments:  HAWKE VILLALPANDO is a 75 y.o. male with an extensive PMHX including CAD, MI, CKD, DM, and CHF, who presents to the Emergency Department via EMS following cardiac arrest just PTA. Per EMS, pt was found his wife when she returned to the room after ~ 10 minutes. Fire arrived and placed AED and shocked the pt once. Pt received CPR for ~  10 minutes and at their rhythm check pt was in sinus rhythm. Pt was intubated PTA and received 2 rounds of epi as well as 500 ccs normal saline. EMS also placed 2 18 gauge IVs (one in the LUE and one inf RUE). EMS notes pt's wife stated that pt was not acting rigth but did not verbalize any complaints. Vitals en route: CBG 176, and BP 138/96   Past Medical History:  Diagnosis Date  . Anemia of chronic disease   . Arthritis   . Atrial fibrillation (HCC)   . Barrett's esophageal ulceration   . CAD (coronary artery disease)    a. Remote CABG 1998. b. Inf MI 2003 s/ DES to SVG-RCA. c. Additional stenting in 2010 - occluded SVG to RCA and DES to SVG-Diag#1. d. Nuc 07/2013 with Intermediate risk stress nuclear study.  There is a medium size defect of moderate severity affecting the apical anterior, mid anterior and mid anteroseptal segments. There is global hypokinesis.  EF 28%.  . Cellulitis Dec./Jan 2011 and 2012  . Chronic systolic CHF (congestive heart failure) (HCC)   . CKD (chronic kidney disease), stage III   .  Diabetes mellitus    a. c/b neuropathy, nephropathy.  . History of hiatal hernia    REFLUX  . Hyperlipidemia   . Hypertension   . Ischemic cardiomyopathy   . Myocardial infarction Feb. 2003   Heart Attack  . Peripheral vascular disease (HCC)   . Pneumonia    TWICE IN LAST 5 YRS  . PONV (postoperative nausea and vomiting)   . Renal artery stenosis (HCC)    a. s/p stent 2007.  . Right-sided carotid artery disease (HCC)   . Shortness of breath dyspnea    WITH EXERTION  . Sleep apnea     Patient Active Problem List   Diagnosis Date Noted  . Asymptomatic carotid artery stenosis 02/25/2015  . Obstructive sleep apnea 04/21/2014  . 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 Chronic combined systolic and diastolic heart failure (HCC) 07/06/2013  . Erectile dysfunction 07/06/2013  . Peripheral vascular disease, unspecified 08/19/2012  . Chronic kidney disease (CKD), stage III (moderate) 05/31/2011  . Cellulitis and abscess of leg, except foot 05/28/2011    Class: Acute  . Diabetes mellitus type 2 with complications, uncontrolled (HCC) 05/28/2011    Class: Chronic  . Hyperlipidemia 02/16/2010  . PERIPHERAL NEUROPATHY 02/16/2010  . Essential hypertension 02/16/2010  . CORONARY ATHEROSCLEROSIS NATIVE CORONARY ARTERY 02/16/2010  . BARRETTS ESOPHAGUS 02/16/2010  . DYSPNEA  02/16/2010    Past Surgical History:  Procedure Laterality Date  . ARTERIAL THROMBECTOMY  2003   X's 2 stents  . CAROTID ENDARTERECTOMY Right 02-25-15   CEA  . CORONARY ARTERY BYPASS GRAFT  Sept. 1998  . ENDARTERECTOMY Right 02/25/2015   Procedure: RIGHT CAROTID ARTERY ENDARTERECTOMY ;  Surgeon: Nada Libman, MD;  Location: [facility] OR;  Service: Vascular;  Laterality: Right;  . KNEE SURGERY  1961   Right knee  . PATCH ANGIOPLASTY Right 02/25/2015   Procedure: WITH XENOSURE BOVINE PATCH ANGIOPLASTY;  Surgeon: Nada Libman, MD;  Location: [facility] OR;  Service: Vascular;  Laterality: Right;  .  Stents surgery  2007  and  2010   Kidney  ( 2007 ) and  Heart ( Sept. 1998 )  . URETHRAL DILATION     1984       Home Medications    Prior to Admission medications   Medication Sig Start Date End Date Taking? Authorizing Provider  acetaminophen (TYLENOL) 325 MG tablet Take 325 mg by mouth 2 (two) times daily.    Historical Provider, MD  aspirin EC 81 MG tablet Take 81 mg by mouth daily.    Historical Provider, MD  azelastine (OPTIVAR) 0.05 % ophthalmic solution Place 1 drop into both eyes daily as needed (ALLERGIES AND DRY EYES). To affected eyes     Historical Provider, MD  BD PEN NEEDLE NANO U/F 32G X 4 MM MISC  01/03/15   Historical Provider, MD  carvedilol (COREG) 12.5 MG tablet Take 1 tablet (12.5 mg total) by mouth 2 (two) times daily. 02/29/16   Lyn Records, MD  cholecalciferol (VITAMIN D) 400 units TABS tablet Take 400 Units by mouth daily.     Historical Provider, MD  CORDRAN 4 MCG/SQCM TAPE as needed (FOR DRY CRACKED HANDS). Apply to hands 05/26/12   Historical Provider, MD  ferrous sulfate 325 (65 FE) MG tablet Take 325 mg by mouth daily.     Historical Provider, MD  fluticasone (FLONASE) 50 MCG/ACT nasal spray Place 1 spray into both nostrils daily as needed for allergies or rhinitis.    Historical Provider, MD  furosemide (LASIX) 80 MG tablet Take 80 mg by mouth 2 (two) times daily.    Historical Provider, MD  insulin glargine (LANTUS) 100 unit/mL SOPN Inject 34 Units into the skin every morning.    Historical Provider, MD  isosorbide mononitrate (IMDUR) 30 MG 24 hr tablet Take 0.5 tablets (15 mg total) by mouth daily. 02/29/16   Lyn Records, MD  latanoprost (XALATAN) 0.005 % ophthalmic solution Place 1 drop into both eyes at bedtime.    Historical Provider, MD  lipase/protease/amylase (CREON) 12000 units CPEP capsule Take 36,000 Units by mouth daily as needed (digestion).     Historical Provider, MD  metolazone (ZAROXOLYN) 2.5 MG tablet Take 2.5 mg by mouth daily as needed  (swelling/fluid). Take 30 to 60 minutes before Lasix    Historical Provider, MD  montelukast (SINGULAIR) 10 MG tablet Take 10 mg by mouth daily.  08/07/12   Historical Provider, MD  NITROSTAT 0.4 MG SL tablet 1 TABLET UNDER TONGUE AS NEEDED. MAY RPT. EVERY 5 MIN. UP TO 3 TIMES.CALL 911 IF NOT FULLY RELIEVED 05/09/15   Lyn Records, MD  PARoxetine (PAXIL) 20 MG tablet Take 20 mg by mouth daily.  05/05/14   Historical Provider, MD  rosuvastatin (CRESTOR) 10 MG tablet Take 10 mg by mouth daily.      Historical Provider, MD  sacubitril-valsartan (ENTRESTO) 24-26 MG Take 1 tablet by mouth 2 (two) times daily. 02/25/16   [name], MD  testosterone cypionate (DEPOTESTOTERONE CYPIONATE) 200 MG/ML injection every 21 ( twenty-one) days. 07/17/12   Historical Provider, MD  TRADJENTA 5 MG TABS tablet Take 5 mg by mouth daily. 04/15/14   Historical Provider, MD  TRESIBA FLEXTOUCH 100 UNIT/ML SOPN FlexTouch Pen Inject 34 Units into the skin every morning. 12/08/15   Historical Provider, MD  vitamin B-12 (CYANOCOBALAMIN) 1000 MCG tablet Take 1,000 mcg by mouth daily.    Historical Provider, MD    Family History Family History  Problem Relation Age of Onset  . Hypertension Mother   . Deep vein thrombosis Mother   . Heart disease Father     Heart Disease before age 73  . Heart attack Father   . Deep vein thrombosis Sister     Amputation  . Diabetes Brother   . Heart disease Brother     Anerysm stomach  . Hypertension Brother   . Deep vein thrombosis Brother     Amputation  . Heart disease Brother     Heart disease before age 73  . Hypertension Brother   . Stroke Neg Hx     Social History Social History  Substance Use Topics  . Smoking status: Never Smoker  . Smokeless tobacco: Never Used  . Alcohol use No     Allergies   Contrast media [iodinated diagnostic agents]; Iodine; Mercury; Clopidogrel bisulfate; and Sulfonamide derivatives   Review of Systems Review of Systems  Unable to  perform ROS: Acuity of condition     Physical Exam Updated Vital Signs There were no vitals taken for this visit.  Physical Exam  Constitutional: He appears well-developed and well-nourished.  HENT:  Head: Normocephalic and atraumatic.  Eyes: Conjunctivae are normal.  Cardiovascular: Normal rate, regular rhythm, normal heart sounds and intact distal pulses.   Pulmonary/Chest:  7.0 ETT in place Gag reflex intact Breath sounds clear  Abdominal: Soft. He exhibits no distension.  Musculoskeletal: He exhibits edema.  Pitting BLE edema L>R  Neurological:  Withdraws RUE to pain   Skin: Skin is warm and dry.  Nursing note and vitals reviewed.    ED Treatments / Results  DIAGNOSTIC STUDIES:  Oxygen Saturation is 97% on ETT, normal by my interpretation.     Labs (all labs ordered are listed, but only abnormal results are displayed) Labs Reviewed  CBC WITH DIFFERENTIAL/PLATELET - Abnormal; Notable for the following:       Result Value   RDW 16.6 (*)    Lymphs Abs 4.4 (*)    All other components within normal limits  APTT  PROTIME-INR  COMPREHENSIVE METABOLIC PANEL  TROPONIN I  LACTIC ACID, PLASMA  LACTIC ACID, PLASMA  URINALYSIS, ROUTINE W REFLEX MICROSCOPIC (NOT [facility])  MAGNESIUM  I-STAT CHEM 8, ED  I-STAT CG4 LACTIC ACID, ED  I-STAT TROPOININ, ED    EKG  EKG Interpretation  Date/Time:  Saturday March 03 2016 14:41:15 EDT Ventricular Rate:  61 PR Interval:    QRS Duration: 143 QT Interval:  428 QTC Calculation: 432 R Axis:   110 Text Interpretation:  Sinus  rhythm Nonspecific intraventricular conduction delay Anteroseptal infarct, old Confirmed by Juleen ChinaKOHUT  MD, Kanan Sobek [phone] on 03/23/2016 3:42:06 PM       Radiology No results found.  Procedures Procedures (including critical care time)   CRITICAL CARE Performed UE:AVWUJWJby:Issabela Lesko Juleen ChinaKohut, MD  Total critical care time: 35 minutes Critical care  time was exclusive of separately billable procedures and  treating other patients. Critical care was necessary to treat or prevent imminent or life-threatening deterioration. Critical care was time spent personally by me on the following activities: development of treatment plan with patient and/or surrogate as well as nursing, discussions with consultants, evaluation of patient's response to treatment, examination of patient, obtaining history from patient or surrogate, ordering and performing treatments and interventions, ordering and review of laboratory studies, ordering and review of radiographic studies, pulse oximetry and re-evaluation of patient's condition.   Medications Ordered in ED Medications - No data to display   Initial Impression / Assessment and Plan / ED Course  I have reviewed the triage vital signs and the nursing notes.  Pertinent labs & imaging results that were available during my care of the patient were reviewed by me and considered in my medical decision making (see chart for details).  Clinical Course    75yM with unwitnessed arrest. Intubated. ~15 minutes+ of downtime. Neurologically not doing much. EValuted by cardiology. Will not go go immediately to cath labs. Hypothermia if no head bleed. Discussed with CCM.   Final Clinical Impressions(s) / ED Diagnoses   Final diagnoses:  Cardiac arrest [facility])    New Prescriptions New Prescriptions   No medications on file   I personally preformed the services scribed in my presence. The recorded information has been reviewed is accurate. Raeford Razor, MD.     Raeford Razor, MD 03/05/16 [phone]

## 2016-03-03 NOTE — Progress Notes (Signed)
   03/01/2016 1500  Clinical Encounter Type  Visited With Family  Visit Type Code;ED  Referral From Nurse  Spiritual Encounters  Spiritual Needs Emotional  Stress Factors  Patient Stress Factors None identified  Family Stress Factors Health changes    Pt brought in stemi post arrest. Met with family, family has concerns for neuro function. [employer], prayer, and emotional support.

## 2016-03-03 NOTE — Progress Notes (Signed)
   03/21/2016 1553  Clinical Encounter Type  Visited With Family  Visit Type Follow-up;Spiritual support  Referral From Physician  Spiritual Encounters  Spiritual Needs Emotional;Other (Comment) (transport to 2h)  Stress Factors  Patient Stress Factors None identified  Family Stress Factors None identified    Chaplain followed up WU:[id number]:physician req. Offered emotional support and transportation ministry to 2h.

## 2016-03-03 NOTE — Progress Notes (Signed)
RT attempted to place Aline times 2 attempt and 1 time by another RT but was unsuccessful in threading catheter.  RN made aware to notify MD.

## 2016-03-03 NOTE — Procedures (Signed)
Central Venous Catheter Insertion Procedure Note Ethan Vargas [id number] 27-Jan-1941  Procedure: Insertion of Central Venous Catheter Indications: Assessment of intravascular volume and Drug and/or fluid administration  Procedure Details Consent: Unable to obtain consent because of emergent medical necessity. Time Out: Verified patient identification, verified procedure, site/side was marked, verified correct patient position, special equipment/implants available, medications/allergies/relevent history reviewed, required imaging and test results available.  Performed  Maximum sterile technique was used including antiseptics, cap, gloves, gown, hand hygiene, mask and sheet. Skin prep: Chlorhexidine; local anesthetic administered A antimicrobial bonded/coated triple lumen catheter was placed in the left internal jugular vein using the Seldinger technique.  Evaluation Blood flow good Complications: No apparent complications Patient did tolerate procedure well. Chest X-ray ordered to verify placement.  CXR: pending.  Performed using ultrasound guidance.  Wire visualized in vessel under ultrasound.    Dirk DressKaty Milanie Rosenfield, NP 03/21/2016  4:43 PM

## 2016-03-03 NOTE — Consult Note (Signed)
Reason for Consult: post arrest eval for STEMI   Referring Physician:  Dr. Wilson Singer  PCP:  Mayra Neer, MD  Primary Cardiologist:DR. Levester Waldridge is an 75 y.o. male.    Chief Complaint: pt found unresponsive by wife at home   HPI:  75 year old male pt with hx of CAD with CABG 1998, [place] 2003 with DES to [place], 2010 stent to VG-RCA and DES to VG-Diag #1. Abnormal nuc study 2015 with EF 28%.  Pt seen by Dr. Rayann Heman and pt refused ICD.  Medications were increased and Echo in 2015 EF was 40-45%.  Also treated for sleep apnea.  Dr. 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   A year ago he had Rt CEA.  He has CKD IV. With Cr 2.19.  Pt was stable on recent visit with Dr. Tamala Julian.   Today he and wife were watching TV, no complaints, wife left room for 10 min and when she returned pt was unresponsive.  EMS was called and was about 10 min before CPR started.  Pt was shocked once intubated, 2 rounds of EPI and 500 cc NS.  He rec'd CPR X 10 min.  May have had 10-30 min of down time.  On arrival to ER Code STEMI was called but Dr. Gwenlyn Found reviewed EKGs and did not see any changes from old EKGs.   CCM is admitting and placing on hypothermic protocol.   Labs in ER K+ 3.3, Cr. 2.18, BUN 33 Troponin 0.12 lactic acid 5.90  H/H 14.2/45.8 CT head no acute event CXR ET tube in place no HF  EKG SB at 53 with LBBB, no significant change from 02/23/16     Past Medical History:  Diagnosis Date  . Anemia of chronic disease   . Arthritis   . Atrial fibrillation ([place])   . Barrett's esophageal ulceration   . CAD (coronary artery disease)    a. Remote CABG 1998. b. Inf MI 2003 s/ DES to SVG-RCA. c. Additional stenting in 2010 - occluded SVG to RCA and DES to SVG-Diag#1. d. Nuc 07/2013 with Intermediate risk stress nuclear study.  There is a medium size defect of moderate severity affecting the apical anterior, mid anterior and mid anteroseptal segments. There is global hypokinesis.  EF  28%.  . Cellulitis Dec./Jan 2011 and 2012  . Chronic systolic CHF (congestive heart failure) ([place])   . CKD (chronic kidney disease), stage III   . Diabetes mellitus    a. c/b neuropathy, nephropathy.  . History of hiatal hernia    REFLUX  . Hyperlipidemia   . Hypertension   . Ischemic cardiomyopathy   . Myocardial infarction Feb. 2003   Heart Attack  . Peripheral vascular disease ([place])   . Pneumonia    TWICE IN LAST 5 YRS  . PONV (postoperative nausea and vomiting)   . Renal artery stenosis ([facility])    a. s/p stent 2007.  . Right-sided carotid artery disease ([place])   . Shortness of breath dyspnea    WITH EXERTION  . Sleep apnea     Past Surgical History:  Procedure Laterality Date  . ARTERIAL THROMBECTOMY  2003   X's 2 stents  . CAROTID ENDARTERECTOMY Right 02-25-15   CEA  . CORONARY ARTERY BYPASS GRAFT  Sept. 1998  . ENDARTERECTOMY Right 02/25/2015   Procedure: RIGHT CAROTID ARTERY ENDARTERECTOMY ;  Surgeon: Serafina Mitchell, MD;  Location: [facility];  Service: Vascular;  Laterality: Right;  . KNEE SURGERY  1961   Right knee  . PATCH ANGIOPLASTY Right 02/25/2015   Procedure: WITH XENOSURE BOVINE PATCH ANGIOPLASTY;  Surgeon: Serafina Mitchell, MD;  Location: [facility] OR;  Service: Vascular;  Laterality: Right;  . Stents surgery  2007  and  2010   Kidney  ( 2007 ) and  Heart ( Sept. 1998 )  . URETHRAL DILATION     1984    Family History  Problem Relation Age of Onset  . Hypertension Mother   . Deep vein thrombosis Mother   . Heart disease Father     Heart Disease before age 69  . Heart attack Father   . Deep vein thrombosis Sister     Amputation  . Diabetes Brother   . Heart disease Brother     Anerysm stomach  . Hypertension Brother   . Deep vein thrombosis Brother     Amputation  . Heart disease Brother     Heart disease before age 22  . Hypertension Brother   . Stroke Neg Hx    Social History:  reports that he has never smoked. He has never used smokeless tobacco. He  reports that he does not drink alcohol or use drugs.  Allergies:  Allergies  Allergen Reactions  . Contrast Media [Iodinated Diagnostic Agents] Swelling    Premedication with prednisone-  Eyes closed from swelling  . Iodine Swelling  . Mercury Swelling  . Clopidogrel Bisulfate Rash    Plavix  . Sulfonamide Derivatives Other (See Comments)    unknown    OUTPATIENT MEDICATIONS: No current facility-administered medications on file prior to encounter.    Current Outpatient Prescriptions on File Prior to Encounter  Medication Sig Dispense Refill  . acetaminophen (TYLENOL) 325 MG tablet Take 325 mg by mouth 2 (two) times daily.    Marland Kitchen aspirin EC 81 MG tablet Take 81 mg by mouth daily.    Marland Kitchen azelastine (OPTIVAR) 0.05 % ophthalmic solution Place 1 drop into both eyes daily as needed (ALLERGIES AND DRY EYES). To affected eyes     . BD PEN NEEDLE NANO U/F 32G X 4 MM MISC     . carvedilol (COREG) 12.5 MG tablet Take 1 tablet (12.5 mg total) by mouth 2 (two) times daily. 60 tablet 11  . cholecalciferol (VITAMIN D) 400 units TABS tablet Take 400 Units by mouth daily.     Marland Kitchen CORDRAN 4 MCG/SQCM TAPE as needed (FOR DRY CRACKED HANDS). Apply to hands    . ferrous sulfate 325 (65 FE) MG tablet Take 325 mg by mouth daily.     . fluticasone (FLONASE) 50 MCG/ACT nasal spray Place 1 spray into both nostrils daily as needed for allergies or rhinitis.    . furosemide (LASIX) 80 MG tablet Take 80 mg by mouth 2 (two) times daily.    . insulin glargine (LANTUS) 100 unit/mL SOPN Inject 34 Units into the skin every morning.    . isosorbide mononitrate (IMDUR) 30 MG 24 hr tablet Take 0.5 tablets (15 mg total) by mouth daily. 15 tablet 11  . latanoprost (XALATAN) 0.005 % ophthalmic solution Place 1 drop into both eyes at bedtime.    . lipase/protease/amylase (CREON) 12000 units CPEP capsule Take 36,000 Units by mouth daily as needed (digestion).     . metolazone (ZAROXOLYN) 2.5 MG tablet Take 2.5 mg by mouth daily  as needed (swelling/fluid). Take 30 to 60 minutes before Lasix    . montelukast (  SINGULAIR) 10 MG tablet Take 10 mg by mouth daily.     Marland Kitchen NITROSTAT 0.4 MG SL tablet 1 TABLET UNDER TONGUE AS NEEDED. MAY RPT. EVERY 5 MIN. UP TO 3 TIMES.CALL 911 IF NOT FULLY RELIEVED 25 tablet 1  . PARoxetine (PAXIL) 20 MG tablet Take 20 mg by mouth daily.     . rosuvastatin (CRESTOR) 10 MG tablet Take 10 mg by mouth daily.      . sacubitril-valsartan (ENTRESTO) 24-26 MG Take 1 tablet by mouth 2 (two) times daily. 180 tablet 3  . testosterone cypionate (DEPOTESTOTERONE CYPIONATE) 200 MG/ML injection every 21 ( twenty-one) days.    . TRADJENTA 5 MG TABS tablet Take 5 mg by mouth daily.    . TRESIBA FLEXTOUCH 100 UNIT/ML SOPN FlexTouch Pen Inject 34 Units into the skin every morning.  4  . vitamin B-12 (CYANOCOBALAMIN) 1000 MCG tablet Take 1,000 mcg by mouth daily.       Results for orders placed or performed during the hospital encounter of 03/08/2016 (from the past 48 hour(s))  CBC with Differential     Status: Abnormal   Collection Time: 03/21/2016  2:42 PM  Result Value Ref Range   WBC 8.0 4.0 - 10.5 K/uL   RBC 4.65 4.22 - 5.81 MIL/uL   Hemoglobin 14.2 13.0 - 17.0 g/dL   HCT 45.8 39.0 - 52.0 %   MCV 98.5 78.0 - 100.0 fL   MCH 30.5 26.0 - 34.0 pg   MCHC 31.0 30.0 - 36.0 g/dL   RDW 16.6 (H) 11.5 - 15.5 %   Platelets 92 (L) 150 - 400 K/uL    Comment: PLATELET COUNT CONFIRMED BY SMEAR   Neutrophils Relative % 40 %   Neutro Abs 3.2 1.7 - 7.7 K/uL   Lymphocytes Relative 54 %   Lymphs Abs 4.4 (H) 0.7 - 4.0 K/uL   Monocytes Relative 3 %   Monocytes Absolute 0.3 0.1 - 1.0 K/uL   Eosinophils Relative 2 %   Eosinophils Absolute 0.2 0.0 - 0.7 K/uL   Basophils Relative 0 %   Basophils Absolute 0.0 0.0 - 0.1 K/uL  Comprehensive metabolic panel     Status: Abnormal   Collection Time: 03/22/2016  2:42 PM  Result Value Ref Range   Sodium 141 135 - 145 mmol/L   Potassium 3.3 (L) 3.5 - 5.1 mmol/L   Chloride 103 101 -  111 mmol/L   CO2 27 22 - 32 mmol/L   Glucose, Bld 172 (H) 65 - 99 mg/dL   BUN 33 (H) 6 - 20 mg/dL   Creatinine, Ser 2.18 (H) 0.61 - 1.24 mg/dL   Calcium 9.1 8.9 - 10.3 mg/dL   Total Protein 6.8 6.5 - 8.1 g/dL   Albumin 3.6 3.5 - 5.0 g/dL   AST 71 (H) 15 - 41 U/L   ALT 47 17 - 63 U/L   Alkaline Phosphatase 66 38 - 126 U/L   Total Bilirubin 0.8 0.3 - 1.2 mg/dL   GFR calc non Af Amer 28 (L) >60 mL/min   GFR calc Af Amer 33 (L) >60 mL/min    Comment: (NOTE) The eGFR has been calculated using the CKD EPI equation. This calculation has not been validated in all clinical situations. eGFR's persistently <60 mL/min signify possible Chronic Kidney Disease.    Anion gap 11 5 - 15  Troponin I     Status: Abnormal   Collection Time: 03/01/2016  2:42 PM  Result Value Ref Range   Troponin I  0.12 (HH) <0.03 ng/mL    Comment: CRITICAL RESULT CALLED TO, READ BACK BY AND VERIFIED WITH: C GROSE,RN 03/23/2016 1557 RHOLMES   Magnesium     Status: None   Collection Time: 03/02/2016  2:42 PM  Result Value Ref Range   Magnesium 2.4 1.7 - 2.4 mg/dL  APTT     Status: None   Collection Time: 03/10/2016  2:42 PM  Result Value Ref Range   aPTT 29 24 - 36 seconds  Protime-INR     Status: None   Collection Time: 03/02/2016  2:42 PM  Result Value Ref Range   Prothrombin Time 15.0 11.4 - 15.2 seconds   INR 1.17   I-Stat Troponin, ED (not at [facility])     Status: Abnormal   Collection Time: 03/20/2016  3:40 PM  Result Value Ref Range   Troponin i, poc 0.11 (HH) 0.00 - 0.08 ng/mL   Comment NOTIFIED PHYSICIAN    Comment 3            Comment: Due to the release kinetics of cTnI, a negative result within the first hours of the onset of symptoms does not rule out myocardial infarction with certainty. If myocardial infarction is still suspected, repeat the test at appropriate intervals.   I-Stat Chem 8, ED     Status: Abnormal   Collection Time: 03/27/2016  3:41 PM  Result Value Ref Range   Sodium 142 135 - 145 mmol/L     Potassium 3.3 (L) 3.5 - 5.1 mmol/L   Chloride 102 101 - 111 mmol/L   BUN 38 (H) 6 - 20 mg/dL   Creatinine, Ser 1.90 (H) 0.61 - 1.24 mg/dL   Glucose, Bld 160 (H) 65 - 99 mg/dL   Calcium, Ion 1.07 (L) 1.15 - 1.40 mmol/L   TCO2 26 0 - 100 mmol/L   Hemoglobin 15.6 13.0 - 17.0 g/dL   HCT 46.0 39.0 - 52.0 %  I-Stat CG4 Lactic Acid, ED     Status: Abnormal   Collection Time: 03/16/2016  3:41 PM  Result Value Ref Range   Lactic Acid, Venous 5.90 (HH) 0.5 - 1.9 mmol/L   Comment NOTIFIED PHYSICIAN    Ct Head Wo Contrast  Result Date: 03/25/2016 CLINICAL DATA:  Cardiac arrest with fall EXAM: CT HEAD WITHOUT CONTRAST TECHNIQUE: Contiguous axial images were obtained from the base of the skull through the vertex without intravenous contrast. COMPARISON:  None. FINDINGS: Brain: There is age related volume loss. There is no intracranial mass, hemorrhage, extra-axial fluid collection, or midline shift. There is slight small vessel disease in the centra semiovale bilaterally. Elsewhere gray-white compartments appear normal. No acute infarct evident. Vascular: There is no hyperdense vessel evident. There is calcification in each carotid siphon region. Skull: The bony calvarium appears intact. Sinuses/Orbits: Patient is intubated. There is opacification of multiple ethmoid air cells. There is extensive opacification in both maxillary antra. There is increased attenuation throughout much of the opacity in the maxillary antra. Note that frontal sinuses are hypoplastic. Orbits appear symmetric bilaterally. Other: Mastoid air cells are clear. IMPRESSION: Age related volume loss with mild periventricular small vessel disease. No intracranial mass, hemorrhage, or extra-axial fluid collection. No acute infarct evident. There is arterial vascular calcification in the carotid siphon regions bilaterally. Paranasal sinus disease at multiple sites. Increased attenuation in the maxillary antra raises question of hemorrhage within  the sinuses or possible subtle calcification. Subtle calcification can be seen with fungal colonization in the sinuses consistent with chronic sinus disease. Electronically  Signed   By: Lowella Grip III M.D.   On: 03/23/2016 15:37   Dg Chest Portable 1 View  Result Date: 03/19/2016 CLINICAL DATA:  Cardiac arrest.  Intubated. EXAM: PORTABLE CHEST 1 VIEW COMPARISON:  03/22/2014 FINDINGS: Endotracheal tube has its tip 2 cm above the carina. There has been previous median sternotomy and CABG. The heart is enlarged. There is aortic atherosclerosis. The lungs are clear. No pulmonary edema. No collapse or effusions. No acute bone finding. IMPRESSION: Endotracheal tube well position with its tip 2 cm above the carina. Previous CABG.  Cardiomegaly.  No active process evident. Electronically Signed   By: Nelson Chimes M.D.   On: 03/08/2016 15:48    ROS: unable to obtain, Wife did not believe any changes except not acting the same today per notes  Blood pressure (!) 79/63, pulse (!) 29, temperature 98.7 F (37.1 C), temperature source Rectal, resp. rate (!) 9, height _0  (1.702 m), weight 157 lb (71.2 kg), SpO2 100 %.  Wt Readings from Last 3 Encounters:  03/26/2016 157 lb (71.2 kg)  02/23/16 156 lb 1.9 oz (70.8 kg)  12/26/15 147 lb (66.7 kg)    PE: per Dr. Gwenlyn Found General:Unresponsive Intubated  Skin:Warm and dry, brisk capillary refill HEENT:normocephalic, sclera clear, mucus membranes moist Neck:supple, no JVD, no bruits  Heart:S1S2 RRR without murmur, gallup, rub or click Lungs:clear without rales, rhonchi, or wheezes VKP:QAES, non tender, + BS, do not palpate liver spleen or masses Ext:no lower ext edema, 2+ pedal pulses, 2+ radial pulses Neuro:unresponisve intubated    Assessment/Plan Active Problems:   Cardiac arrest [facility])  Dr. Gwenlyn Found has seen and examined.  He does not believe this is a STEMI.  At this point medical management.  Pt is hypotensive and will need pressors.  Dr. Gwenlyn Found did  talk with family and discussed above.  He is not sure if pt will survive due to long down time.  If he does with treatment then we would like to cath him once more stable.  We will follow along.     Cecilie Kicks  Nurse Practitioner Certified [place] Pager [phone] or after 5pm or weekends call [phone] 02/28/2016, 4:02 PM

## 2016-03-03 NOTE — ED Triage Notes (Signed)
[facility]. Spouse was with patient and returned to find patient down for approximately 10 minutes. Fire arrived and placed AED and shocked x 1. EMS administered EPI x 2 and return of pulses. Arrived with 7.0 ETT, 26 @ lip. CBG 170. Also received 500 total NS and 8 minutes of CPR prior to ROS

## 2016-03-03 NOTE — H&P (Signed)
PULMONARY / CRITICAL CARE MEDICINE   Name: Ethan Vargas MRN: [id number] DOB: January 18, 1941    ADMISSION DATE:  18-Mar-2016  REFERRING MD:  EDP   CHIEF COMPLAINT:  Post arrest   HISTORY OF PRESENT ILLNESS:   75yo male with hx AFib, CAD, CKD III, DM, PVD, OSA, chronic sCHF, ischemic cardiomyopathy with EF 35-40%, presented 10/7 after being found down at [facility]  Had probable 10-20 mins downtime prior to CPR.  Fire arrived and AED advised shock x1, was asystole on EMS arrival with 8-12mins CPR prior to ROSC so total 20-44mins downtime.  Intubated in field by EMS.  Initially had some ST elevation on EKG and code STEMI was called, but on cardiology evaluation there was no further ST elevation, pt was hypotensive and decision was made not to take to cath lab based on prolonged downtime and overall instability. No purposeful movement noted post ROSC.  PCCM consulted for admit and hypothermia protocol.    PAST MEDICAL HISTORY :  He  has a past medical history of Anemia of chronic disease; Arthritis; Atrial fibrillation (HCC); Barrett's esophageal ulceration; CAD (coronary artery disease); Cellulitis (Dec./Jan 2011 and 2012); Chronic systolic CHF (congestive heart failure) (HCC); CKD (chronic kidney disease), stage III; Diabetes mellitus; History of hiatal hernia; Hyperlipidemia; Hypertension; Ischemic cardiomyopathy; Myocardial infarction (Feb. 2003); Peripheral vascular disease (HCC); Pneumonia; PONV (postoperative nausea and vomiting); Renal artery stenosis (HCC); Right-sided carotid artery disease (HCC); Shortness of breath dyspnea; and Sleep apnea.  PAST SURGICAL HISTORY: He  has a past surgical history that includes Coronary artery bypass graft (Sept. 1998); Arterial thrombectomy (2003); Stents surgery (2007  and  2010); Knee surgery (1961); Urethral dilation; Endarterectomy (Right, 02/25/2015); Patch angioplasty (Right, 02/25/2015); and Carotid endarterectomy (Right, 02-25-15).  Allergies  Allergen  Reactions  . Contrast Media [Iodinated Diagnostic Agents] Swelling    Premedication with prednisone-  Eyes closed from swelling  . Iodine Swelling  . Mercury Swelling  . Clopidogrel Bisulfate Rash    Plavix  . Sulfonamide Derivatives Other (See Comments)    unknown    No current facility-administered medications on file prior to encounter.    Current Outpatient Prescriptions on File Prior to Encounter  Medication Sig  . acetaminophen (TYLENOL) 325 MG tablet Take 325 mg by mouth 2 (two) times daily.  Marland Kitchen aspirin EC 81 MG tablet Take 81 mg by mouth daily.  Marland Kitchen azelastine (OPTIVAR) 0.05 % ophthalmic solution Place 1 drop into both eyes daily as needed (ALLERGIES AND DRY EYES). To affected eyes   . BD PEN NEEDLE NANO U/F 32G X 4 MM MISC   . carvedilol (COREG) 12.5 MG tablet Take 1 tablet (12.5 mg total) by mouth 2 (two) times daily.  . cholecalciferol (VITAMIN D) 400 units TABS tablet Take 400 Units by mouth daily.   Marland Kitchen CORDRAN 4 MCG/SQCM TAPE as needed (FOR DRY CRACKED HANDS). Apply to hands  . ferrous sulfate 325 (65 FE) MG tablet Take 325 mg by mouth daily.   . fluticasone (FLONASE) 50 MCG/ACT nasal spray Place 1 spray into both nostrils daily as needed for allergies or rhinitis.  . furosemide (LASIX) 80 MG tablet Take 80 mg by mouth 2 (two) times daily.  . insulin glargine (LANTUS) 100 unit/mL SOPN Inject 34 Units into the skin every morning.  . isosorbide mononitrate (IMDUR) 30 MG 24 hr tablet Take 0.5 tablets (15 mg total) by mouth daily.  Marland Kitchen latanoprost (XALATAN) 0.005 % ophthalmic solution Place 1 drop into both eyes at bedtime.  Marland Kitchen  lipase/protease/amylase (CREON) 12000 units CPEP capsule Take 36,000 Units by mouth daily as needed (digestion).   . metolazone (ZAROXOLYN) 2.5 MG tablet Take 2.5 mg by mouth daily as needed (swelling/fluid). Take 30 to 60 minutes before Lasix  . montelukast (SINGULAIR) 10 MG tablet Take 10 mg by mouth daily.   Marland Kitchen NITROSTAT 0.4 MG SL tablet 1 TABLET UNDER  TONGUE AS NEEDED. MAY RPT. EVERY 5 MIN. UP TO 3 TIMES.CALL 911 IF NOT FULLY RELIEVED  . PARoxetine (PAXIL) 20 MG tablet Take 20 mg by mouth daily.   . rosuvastatin (CRESTOR) 10 MG tablet Take 10 mg by mouth daily.    . sacubitril-valsartan (ENTRESTO) 24-26 MG Take 1 tablet by mouth 2 (two) times daily.  Marland Kitchen testosterone cypionate (DEPOTESTOTERONE CYPIONATE) 200 MG/ML injection every 21 ( twenty-one) days.  . TRADJENTA 5 MG TABS tablet Take 5 mg by mouth daily.  . TRESIBA FLEXTOUCH 100 UNIT/ML SOPN FlexTouch Pen Inject 34 Units into the skin every morning.  . vitamin B-12 (CYANOCOBALAMIN) 1000 MCG tablet Take 1,000 mcg by mouth daily.    FAMILY HISTORY:  His indicated that his mother is deceased. He indicated that his father is deceased. He indicated that his sister is deceased. He indicated that all of his three brothers are deceased. He indicated that his maternal grandmother is deceased. He indicated that his maternal grandfather is deceased. He indicated that his paternal grandmother is deceased. He indicated that his paternal grandfather is deceased. He indicated that the status of his neg hx is unknown.    SOCIAL HISTORY: He  reports that he has never smoked. He has never used smokeless tobacco. He reports that he does not drink alcohol or use drugs.  REVIEW OF SYSTEMS:   As per HPI - All other systems reviewed and were neg.    SUBJECTIVE:    VITAL SIGNS: BP (!) 79/63   Pulse (!) 29   Temp 98.7 F (37.1 C) (Rectal)   Resp (!) 9   Ht 5\' 7"  (1.702 m)   Wt 71.2 kg (157 lb)   SpO2 100%   BMI 24.59 kg/m   HEMODYNAMICS:    VENTILATOR SETTINGS: Vent Mode: PRVC FiO2 (%):  [100 %] 100 % Set Rate:  [15 bmp] 15 bmp Vt Set:  [510 mL] 510 mL PEEP:  [5 cmH20] 5 cmH20 Plateau Pressure:  [22 cmH20] 22 cmH20  INTAKE / OUTPUT: No intake/output data recorded.  PHYSICAL EXAMINATION: General:  wdwn older male, acutely ill appearing  Neuro:  Minimally responsive, pupils 4mm, equal,  sluggish HEENT:  Mm moist, no JVD, ETT   Cardiovascular:  s1s2 irreg, frequent PVC's, intermittent bradycardia  Lungs:  resps even non labored on vent, diminished L  Abdomen:  Round, soft, +bs  Musculoskeletal:  Warm and dry, no edema   LABS:  BMET  Recent Labs Lab 03/08/2016 1442 02/27/2016 1541  NA 141 142  K 3.3* 3.3*  CL 103 102  CO2 27  --   BUN 33* 38*  CREATININE 2.18* 1.90*  GLUCOSE 172* 160*    Electrolytes  Recent Labs Lab 03/18/2016 1442  CALCIUM 9.1  MG 2.4    CBC  Recent Labs Lab 03/08/2016 1442 03/27/2016 1541  WBC 8.0  --   HGB 14.2 15.6  HCT 45.8 46.0  PLT 92*  --     Coag's  Recent Labs Lab 03/14/2016 1442  APTT 29  INR 1.17    Sepsis Markers  Recent Labs Lab 03/22/2016 1541  LATICACIDVEN 5.90*  ABG No results for input(s): PHART, PCO2ART, PO2ART in the last 168 hours.  Liver Enzymes  Recent Labs Lab January 01, 2016 1442  AST 71*  ALT 47  ALKPHOS 66  BILITOT 0.8  ALBUMIN 3.6    Cardiac Enzymes  Recent Labs Lab January 01, 2016 1442  TROPONINI 0.12*    Glucose No results for input(s): GLUCAP in the last 168 hours.  Imaging Ct Head Wo Contrast  Result Date: 02/26/2016 CLINICAL DATA:  Cardiac arrest with fall EXAM: CT HEAD WITHOUT CONTRAST TECHNIQUE: Contiguous axial images were obtained from the base of the skull through the vertex without intravenous contrast. COMPARISON:  None. FINDINGS: Brain: There is age related volume loss. There is no intracranial mass, hemorrhage, extra-axial fluid collection, or midline shift. There is slight small vessel disease in the centra semiovale bilaterally. Elsewhere gray-white compartments appear normal. No acute infarct evident. Vascular: There is no hyperdense vessel evident. There is calcification in each carotid siphon region. Skull: The bony calvarium appears intact. Sinuses/Orbits: Patient is intubated. There is opacification of multiple ethmoid air cells. There is extensive opacification in both  maxillary antra. There is increased attenuation throughout much of the opacity in the maxillary antra. Note that frontal sinuses are hypoplastic. Orbits appear symmetric bilaterally. Other: Mastoid air cells are clear. IMPRESSION: Age related volume loss with mild periventricular small vessel disease. No intracranial mass, hemorrhage, or extra-axial fluid collection. No acute infarct evident. There is arterial vascular calcification in the carotid siphon regions bilaterally. Paranasal sinus disease at multiple sites. Increased attenuation in the maxillary antra raises question of hemorrhage within the sinuses or possible subtle calcification. Subtle calcification can be seen with fungal colonization in the sinuses consistent with chronic sinus disease. Electronically Signed   By: Bretta BangWilliam  Woodruff III M.D.   On: 11/11/2015 15:37   Dg Chest Portable 1 View  Result Date: 03/12/2016 CLINICAL DATA:  Cardiac arrest.  Intubated. EXAM: PORTABLE CHEST 1 VIEW COMPARISON:  03/22/2014 FINDINGS: Endotracheal tube has its tip 2 cm above the carina. There has been previous median sternotomy and CABG. The heart is enlarged. There is aortic atherosclerosis. The lungs are clear. No pulmonary edema. No collapse or effusions. No acute bone finding. IMPRESSION: Endotracheal tube well position with its tip 2 cm above the carina. Previous CABG.  Cardiomegaly.  No active process evident. Electronically Signed   By: Paulina FusiMark  Shogry M.D.   On: 11/11/2015 15:48     STUDIES:  CT head 10/7>>>Age related volume loss with mild periventricular small vessel disease. No intracranial mass, hemorrhage, or extra-axial fluid collection. No acute infarct evident.   There is arterial vascular calcification in the carotid siphon regions bilaterally.   Paranasal sinus disease at multiple sites. Increased attenuation in the maxillary antra raises question of hemorrhage within the sinuses or possible subtle calcification. Subtle calcification  can be seen with fungal colonization in the sinuses consistent with chronic sinus disease. Echo 10/7>>>  CULTURES: BC x 2 10/7>>>   ANTIBIOTICS:   SIGNIFICANT EVENTS:   LINES/TUBES: ETT 10/7>>>  DISCUSSION: 75yo male with hx CAD, sCHF, ischemic cardiomyopathy admitted 10/7 after cardiac arrest with total downtime 20-30 minutes including at least 10 mins prior to CPR.    ASSESSMENT / PLAN:  PULMONARY Acute respiratory failure - post cardiac arrest  OSA on CPAP  Pulmonary HTN P:   Vent support - 8cc/kg  F/u CXR  F/u ABG Retract ETT 2-3 cm   CARDIOVASCULAR Cardiac arrest - downtime 20-30 mins. Initial troponin 0.11 Ischemic cardiomyopathy - previous EF  35-40% CAD - remote CABG 1998 sCHF  AFib  P:  Cards to see - no plans for cath  Hypothermia protocol  Low dose dopamine given intermittent bradycardia  Will defer full dose anticoagulation to cards  Hold home imdur, lasix, metolazone, coreg  Follow serial troponin  RENAL CKD - baseline Scr ~2.0; at risk for an acute renal injury given down time Hypokalemia  Acute lactic acidosis P:   F/u chem and UOP Follow lactate for clearance  GASTROINTESTINAL Nutrition SUP P:   pepcid bid NPO  Initiate TF on 10/8  HEMATOLOGIC  P:  F/u CBC  SQ heparin; as above defer decision for full dose heparin to cardiology  INFECTIOUS No evidence acute infection P:   Monitor wbc  Follow clinically off abx.   ENDOCRINE DM   P:   SSI   NEUROLOGIC AMS - post cardiac arrest.  ??small area of hemorrhage in sinuses, no intraparenchymal bleeding P:   RASS goal: -5 Hypothermia protocol as above  EEG  Fentanyl, versed, nimbex per protocol    FAMILY  - Updates: will speak with family in ED 10/7 when available.   - Inter-disciplinary family meet or Palliative Care meeting due by: 03/10/16  Independent CC time 60 minutes.    Levy Pupa, MD, PhD March 27, 2016, 4:19 PM [facility] [phone]  or if no answer [phone]

## 2016-03-03 NOTE — ED Notes (Signed)
Cards and Critical care at bedside

## 2016-03-03 NOTE — ED Notes (Signed)
ABG attempted x2.  A second RT has been called to come stick.

## 2016-03-04 DIAGNOSIS — I5042 Chronic combined systolic (congestive) and diastolic (congestive) heart failure: Secondary | ICD-10-CM

## 2016-03-04 DIAGNOSIS — J9601 Acute respiratory failure with hypoxia: Secondary | ICD-10-CM

## 2016-03-04 DIAGNOSIS — I447 Left bundle-branch block, unspecified: Secondary | ICD-10-CM

## 2016-03-04 LAB — BASIC METABOLIC PANEL
ANION GAP: 10 (ref 5–15)
ANION GAP: 10 (ref 5–15)
ANION GAP: 9 (ref 5–15)
ANION GAP: 8 (ref 5–15)
ANION GAP: 9 (ref 5–15)
BUN: 32 mg/dL — ABNORMAL HIGH (ref 6–20)
BUN: 31 mg/dL — ABNORMAL HIGH (ref 6–20)
BUN: 31 mg/dL — ABNORMAL HIGH (ref 6–20)
BUN: 31 mg/dL — ABNORMAL HIGH (ref 6–20)
BUN: 31 mg/dL — ABNORMAL HIGH (ref 6–20)
CALCIUM: 8.5 mg/dL — AB (ref 8.9–10.3)
CALCIUM: 8.5 mg/dL — AB (ref 8.9–10.3)
CALCIUM: 8.3 mg/dL — AB (ref 8.9–10.3)
CALCIUM: 8.6 mg/dL — AB (ref 8.9–10.3)
CHLORIDE: 109 mmol/L (ref 101–111)
CO2: 19 mmol/L — ABNORMAL LOW (ref 22–32)
CO2: 19 mmol/L — ABNORMAL LOW (ref 22–32)
CO2: 19 mmol/L — ABNORMAL LOW (ref 22–32)
CO2: 18 mmol/L — ABNORMAL LOW (ref 22–32)
CO2: 17 mmol/L — AB (ref 22–32)
CREATININE: 1.97 mg/dL — AB (ref 0.61–1.24)
CREATININE: 1.97 mg/dL — AB (ref 0.61–1.24)
CREATININE: 1.92 mg/dL — AB (ref 0.61–1.24)
Calcium: 8 mg/dL — ABNORMAL LOW (ref 8.9–10.3)
Chloride: 111 mmol/L (ref 101–111)
Chloride: 111 mmol/L (ref 101–111)
Chloride: 110 mmol/L (ref 101–111)
Chloride: 113 mmol/L — ABNORMAL HIGH (ref 101–111)
Creatinine, Ser: 1.94 mg/dL — ABNORMAL HIGH (ref 0.61–1.24)
Creatinine, Ser: 1.9 mg/dL — ABNORMAL HIGH (ref 0.61–1.24)
GFR calc Af Amer: 39 mL/min — ABNORMAL LOW (ref >60)
GFR calc non Af Amer: 33 mL/min — ABNORMAL LOW (ref >60)
GFR, EST AFRICAN AMERICAN: 38 mL/min — AB (ref >60)
GFR, EST AFRICAN AMERICAN: 37 mL/min — AB (ref >60)
GFR, EST AFRICAN AMERICAN: 37 mL/min — AB (ref >60)
GFR, EST AFRICAN AMERICAN: 38 mL/min — AB (ref >60)
GFR, EST NON AFRICAN AMERICAN: 33 mL/min — AB (ref >60)
GFR, EST NON AFRICAN AMERICAN: 32 mL/min — AB (ref >60)
GFR, EST NON AFRICAN AMERICAN: 32 mL/min — AB (ref >60)
GFR, EST NON AFRICAN AMERICAN: 33 mL/min — AB (ref >60)
GLUCOSE: 138 mg/dL — AB (ref 65–99)
GLUCOSE: 108 mg/dL — AB (ref 65–99)
Glucose, Bld: 92 mg/dL (ref 65–99)
Glucose, Bld: 106 mg/dL — ABNORMAL HIGH (ref 65–99)
Glucose, Bld: 346 mg/dL — ABNORMAL HIGH (ref 65–99)
POTASSIUM: 3.8 mmol/L (ref 3.5–5.1)
Potassium: 3.6 mmol/L (ref 3.5–5.1)
Potassium: 3.5 mmol/L (ref 3.5–5.1)
Potassium: 3.6 mmol/L (ref 3.5–5.1)
Potassium: 4.1 mmol/L (ref 3.5–5.1)
SODIUM: 140 mmol/L (ref 135–145)
Sodium: 140 mmol/L (ref 135–145)
Sodium: 138 mmol/L (ref 135–145)
Sodium: 139 mmol/L (ref 135–145)
Sodium: 135 mmol/L (ref 135–145)

## 2016-03-04 LAB — POCT I-STAT, CHEM 8
BUN: 36 mg/dL — ABNORMAL HIGH (ref 6–20)
CREATININE: 1.8 mg/dL — AB (ref 0.61–1.24)
Calcium, Ion: 1.09 mmol/L — ABNORMAL LOW (ref 1.15–1.40)
Chloride: 110 mmol/L (ref 101–111)
Glucose, Bld: 88 mg/dL (ref 65–99)
HEMATOCRIT: 41 % (ref 39.0–52.0)
HEMOGLOBIN: 13.9 g/dL (ref 13.0–17.0)
POTASSIUM: 3.4 mmol/L — AB (ref 3.5–5.1)
SODIUM: 144 mmol/L (ref 135–145)
TCO2: 25 mmol/L (ref 0–100)

## 2016-03-04 LAB — BLOOD GAS, ARTERIAL
ACID-BASE DEFICIT: 3.3 mmol/L — AB (ref 0.0–2.0)
Bicarbonate: 20.3 mmol/L (ref 20.0–28.0)
DRAWN BY: 40415
FIO2: 60
MECHVT: 530 mL
O2 SAT: 99.7 %
PCO2 ART: 24.6 mmHg — AB (ref 32.0–48.0)
PEEP: 5 cmH2O
PH ART: 7.502 — AB (ref 7.350–7.450)
Patient temperature: 90.3
RATE: 18 resp/min
pO2, Arterial: 212 mmHg — ABNORMAL HIGH (ref 83.0–108.0)

## 2016-03-04 LAB — CBC
HEMATOCRIT: 44.3 % (ref 39.0–52.0)
Hemoglobin: 14.1 g/dL (ref 13.0–17.0)
MCH: 30.1 pg (ref 26.0–34.0)
MCHC: 31.8 g/dL (ref 30.0–36.0)
MCV: 94.5 fL (ref 78.0–100.0)
Platelets: 73 10*3/uL — ABNORMAL LOW (ref 150–400)
RBC: 4.69 MIL/uL (ref 4.22–5.81)
RDW: 16.5 % — AB (ref 11.5–15.5)
WBC: 7.2 10*3/uL (ref 4.0–10.5)

## 2016-03-04 LAB — GLUCOSE, CAPILLARY
GLUCOSE-CAPILLARY: 85 mg/dL (ref 65–99)
GLUCOSE-CAPILLARY: 93 mg/dL (ref 65–99)
GLUCOSE-CAPILLARY: 79 mg/dL (ref 65–99)
GLUCOSE-CAPILLARY: 101 mg/dL — AB (ref 65–99)
GLUCOSE-CAPILLARY: 55 mg/dL — AB (ref 65–99)
Glucose-Capillary: 113 mg/dL — ABNORMAL HIGH (ref 65–99)
Glucose-Capillary: 140 mg/dL — ABNORMAL HIGH (ref 65–99)
Glucose-Capillary: 165 mg/dL — ABNORMAL HIGH (ref 65–99)
Glucose-Capillary: 60 mg/dL — ABNORMAL LOW (ref 65–99)
Glucose-Capillary: 88 mg/dL (ref 65–99)
Glucose-Capillary: 95 mg/dL (ref 65–99)

## 2016-03-04 LAB — PHOSPHORUS

## 2016-03-04 LAB — TROPONIN I
TROPONIN I: 3.85 ng/mL — AB (ref <0.03)
Troponin I: 4.75 ng/mL (ref <0.03)
Troponin I: 4.96 ng/mL (ref <0.03)

## 2016-03-04 LAB — MAGNESIUM: MAGNESIUM: 2 mg/dL (ref 1.7–2.4)

## 2016-03-04 LAB — PROTIME-INR
INR: 1.28
Prothrombin Time: 16.1 seconds — ABNORMAL HIGH (ref 11.4–15.2)

## 2016-03-04 LAB — APTT: aPTT: 33 seconds (ref 24–36)

## 2016-03-04 MED ORDER — INFLUENZA VAC SPLIT QUAD 0.5 ML IM SUSY: 0.5000 mL | PREFILLED_SYRINGE | INTRAMUSCULAR | Status: DC | PRN

## 2016-03-04 MED ORDER — PNEUMOCOCCAL VAC POLYVALENT 25 MCG/0.5ML IJ INJ: 0.5000 mL | INJECTION | INTRAMUSCULAR | Status: DC | PRN

## 2016-03-04 MED ORDER — POTASSIUM CHLORIDE 20 MEQ/15ML (10%) PO SOLN
40.0000 meq | Freq: Once | ORAL | Status: AC
  Administered 2016-03-04: 40 meq
  Filled 2016-03-04: qty 30

## 2016-03-04 MED ORDER — DEXTROSE 5 % IV SOLN
INTRAVENOUS | Status: DC
  Administered 2016-03-04: 21:00:00 via INTRAVENOUS

## 2016-03-04 MED ORDER — DEXTROSE 50 % IV SOLN
1.0000 | Freq: Once | INTRAVENOUS | Status: AC
  Administered 2016-03-04: 50 mL via INTRAVENOUS

## 2016-03-04 MED ORDER — FENTANYL 2500MCG IN NS 250ML (10MCG/ML) PREMIX INFUSION
100.0000 ug/h | INTRAVENOUS | Status: DC
  Administered 2016-03-04 – 2016-03-06 (×3): 150 ug/h via INTRAVENOUS
  Filled 2016-03-04 (×4): qty 250

## 2016-03-04 MED ORDER — PHENYLEPHRINE HCL 10 MG/ML IJ SOLN
0.0000 ug/min | INTRAMUSCULAR | Status: DC
  Administered 2016-03-04: 20 ug/min via INTRAVENOUS
  Administered 2016-03-04: 60 ug/min via INTRAVENOUS
  Administered 2016-03-05: 0 ug/min via INTRAVENOUS
  Administered 2016-03-05: 90 ug/min via INTRAVENOUS
  Administered 2016-03-05: 0 ug/min via INTRAVENOUS
  Filled 2016-03-04 (×4): qty 4

## 2016-03-04 MED ORDER — POTASSIUM PHOSPHATES 15 MMOLE/5ML IV SOLN
30.0000 mmol | Freq: Once | INTRAVENOUS | Status: AC
  Administered 2016-03-04: 30 mmol via INTRAVENOUS
  Filled 2016-03-04: qty 10

## 2016-03-04 MED ORDER — DEXTROSE 50 % IV SOLN
INTRAVENOUS | Status: AC
  Filled 2016-03-04: qty 50

## 2016-03-04 MED ORDER — SODIUM CHLORIDE 0.9 % IV SOLN: INTRAVENOUS | Status: DC | PRN

## 2016-03-04 NOTE — Progress Notes (Signed)
Hypoglycemic Event  CBG: 55  Treatment:1 amp D50  Symptoms: none  Follow-up CBG: Time:2030 CBG Result:165  Possible Reasons for Event:Unknown Comments/MD notified: ELink notified.     Hillery AldoStowe, Bertran Zeimet Dawne

## 2016-03-04 NOTE — Progress Notes (Signed)
Dr. Jamison NeighborNestor made aware of Critical Phos of less than 1.0.

## 2016-03-04 NOTE — Progress Notes (Signed)
eLink Physician-Brief Progress Note Patient Name: Ethan Vargas DOB: Sep 06, 1940 MRN: [id number]   Date of Service  03/04/2016  HPI/Events of Note  Phos <1.0  eICU Interventions  KPhos 30 mmol IV     Intervention Category Intermediate Interventions: Electrolyte abnormality - evaluation and management  Lawanda CousinsJennings Kyera Felan 03/04/2016, 5:21 AM

## 2016-03-04 NOTE — Progress Notes (Signed)
eLink Physician-Brief Progress Note Patient Name: Ethan Vargas DOB: 1941-03-11 MRN: [id number]   Date of Service  03/04/2016  HPI/Events of Note  Notified about bedside nurse of mean arterial pressure 70. Hypothermia protocol calls for 80. CVP 9. FiO2 0.6.   eICU Interventions  Initiating Neo-Synephrine to maintain mean arterial pressure per hypothermia protocol      Intervention Category Major Interventions: Hypotension - evaluation and management  Lawanda CousinsJennings Yoshito Gaza 03/04/2016, 3:13 AM

## 2016-03-04 NOTE — Progress Notes (Signed)
eLink Physician-Brief Progress Note Patient Name: Ethan Vargas DOB: May 24, 1941 MRN: [id number]   Date of Service  03/04/2016  HPI/Events of Note  Hypoglycemia - Blood glucose = 55. Already treated with D50.   eICU Interventions  Will order: 1. D5W IV infusion at 40 mL/hour.     Intervention Category Major Interventions: Other:  Ethan Vargas 03/04/2016, 8:50 PM

## 2016-03-04 NOTE — Progress Notes (Signed)
PULMONARY / CRITICAL CARE MEDICINE   Name: Ethan Vargas MRN: [id number] DOB: September 17, 1940    ADMISSION DATE:  03/13/2016  REFERRING MD:  EDP   CHIEF COMPLAINT:  Post arrest   BRIEF 75yo male with hx AFib, CAD, CKD III, DM, PVD, OSA, chronic sCHF, ischemic cardiomyopathy with EF 35-40%, presented 10/7 after being found down at [facility].  Had probable 10-20 mins downtime prior to CPR.  Fire arrived and AED advised shock x1, was asystole on EMS arrival with 8-64mins CPR prior to ROSC so total 20-43mins downtime.  Intubated in field by EMS.  Initially had some ST elevation on EKG and code STEMI was called, but on cardiology evaluation there was no further ST elevation, pt was hypotensive and decision was made not to take to cath lab based on prolonged downtime and overall instability. No purposeful movement noted post ROSC.  PCCM consulted for admit and hypothermia protocol.    PAST MEDICAL HISTORY :  He  has a past medical history of Anemia of chronic disease; Arthritis; Atrial fibrillation (HCC); Barrett's esophageal ulceration; CAD (coronary artery disease); Cellulitis (Dec./Jan 2011 and 2012); Chronic systolic CHF (congestive heart failure) (HCC); CKD (chronic kidney disease), stage III; Diabetes mellitus; History of hiatal hernia; Hyperlipidemia; Hypertension; Ischemic cardiomyopathy; Myocardial infarction (Feb. 2003); Peripheral vascular disease (HCC); Pneumonia; PONV (postoperative nausea and vomiting); Renal artery stenosis (HCC); Right-sided carotid artery disease (HCC); Shortness of breath dyspnea; and Sleep apnea.  PAST SURGICAL HISTORY: He  has a past surgical history that includes Coronary artery bypass graft (Sept. 1998); Arterial thrombectomy (2003); Stents surgery (2007  and  2010); Knee surgery (1961); Urethral dilation; Endarterectomy (Right, 02/25/2015); Patch angioplasty (Right, 02/25/2015); and Carotid endarterectomy (Right, 02-25-15).  SUBJECTIVE:  03/04/16 - sedated with fent,  versed, and paralzed with nimbex. On neo.   VITAL SIGNS: BP (!) 115/57   Pulse (!) 46   Temp (!) 91.8 F (33.2 C)   Resp 18   Ht 5\' 7"  (1.702 m)   Wt 72 kg (158 lb 12.1 oz)   SpO2 100%   BMI 24.86 kg/m   HEMODYNAMICS: CVP:  [5 mmHg-18 mmHg] 9 mmHg  VENTILATOR SETTINGS: Vent Mode: PRVC FiO2 (%):  [40 %-100 %] 40 % Set Rate:  [15 bmp-18 bmp] 18 bmp Vt Set:  [510 mL-530 mL] 530 mL PEEP:  [5 cmH20] 5 cmH20 Plateau Pressure:  [16 cmH20-24 cmH20] 23 cmH20  INTAKE / OUTPUT: I/O last 3 completed shifts: In: 1686.3 [I.V.:1436.3; IV Piggyback:250] Out: 625 [Urine:625]  PHYSICAL EXAMINATION: General:  wdwn older male, acutely ill appearing  Neuro:  Minimally responsive, pupils 4mm, equal, sluggish HEENT:  Mm moist, no JVD, ETT   Cardiovascular:  s1s2 irreg, frequent PVC's, intermittent bradycardia  Lungs:  resps even non labored on vent, diminished L  Abdomen:  Round, soft, +bs  Musculoskeletal:  Warm and dry, no edema   LABS: PULMONARY  Recent Labs Lab 03/27/2016 1541 03/09/2016 1607 03/26/2016 1747 03/19/2016 2003 03/04/16 0035 03/04/16 0435  PHART  --  7.327*  --   --   --  7.502*  PCO2ART  --  49.5*  --   --   --  24.6*  PO2ART  --  279.0*  --   --   --  212*  HCO3  --  25.9  --   --   --  20.3  TCO2 26 27 26 24 25   --   O2SAT  --  100.0  --   --   --  99.7    CBC  Recent Labs Lab 03/02/2016 1442  03/11/2016 1749 03/02/2016 2003 03/04/16 0035 03/04/16 0423  HGB 14.2  < > 13.2 16.0 13.9 14.1  HCT 45.8  < > 42.1 47.0 41.0 44.3  WBC 8.0  --  9.7  --   --  7.2  PLT 92*  --  104*  --   --  73*  < > = values in this interval not displayed.  COAGULATION  Recent Labs Lab 03/05/2016 1442 03/04/16 0000  INR 1.17 1.28    CARDIAC   Recent Labs Lab 03/25/2016 1442 03/04/16 0054 03/04/16 0423  TROPONINI 0.12* 3.85* 4.75*   No results for input(s): PROBNP in the last 168 hours.   CHEMISTRY  Recent Labs Lab 03/02/2016 1442  03/12/2016 1747 03/09/2016 1749  03/11/2016 2003 03/21/2016 2212 03/04/16 0035 03/04/16 0423 03/04/16 0812  NA 141  < > 144  --  146*  --  144 140 140  K 3.3*  < > 3.4*  --  3.1*  --  3.4* 3.6 3.5  CL 103  < > 104  --  104  --  110 111 111  CO2 27  --   --   --   --   --   --  19* 19*  GLUCOSE 172*  < > 74  --  111*  --  88 92 106*  BUN 33*  < > 34*  --  34*  --  36* 32* 31*  CREATININE 2.18*  < > 2.00* 2.15* 1.80*  --  1.80* 1.94* 1.97*  CALCIUM 9.1  --   --   --   --   --   --  8.5* 8.5*  MG 2.4  --   --   --   --  2.0  --  2.0  --   PHOS  --   --   --   --   --   --   --  <1.0*  --   < > = values in this interval not displayed. Estimated Creatinine Clearance: 31.2 mL/min (by C-G formula based on SCr of 1.97 mg/dL (H)).   LIVER  Recent Labs Lab 03/09/2016 1442 03/04/16 0000  AST 71*  --   ALT 47  --   ALKPHOS 66  --   BILITOT 0.8  --   PROT 6.8  --   ALBUMIN 3.6  --   INR 1.17 1.28     INFECTIOUS  Recent Labs Lab 03/13/2016 1502 03/24/2016 1541 03/10/2016 2212  LATICACIDVEN 1.4 5.90* 1.3     ENDOCRINE CBG (last 3)   Recent Labs  03/04/16 0410 03/04/16 0611 03/04/16 0807  GLUCAP 93 79 101*         IMAGING x48h  - image(s) personally visualized  -   highlighted in bold Ct Head Wo Contrast  Result Date: 03/21/2016 CLINICAL DATA:  Cardiac arrest with fall EXAM: CT HEAD WITHOUT CONTRAST TECHNIQUE: Contiguous axial images were obtained from the base of the skull through the vertex without intravenous contrast. COMPARISON:  None. FINDINGS: Brain: There is age related volume loss. There is no intracranial mass, hemorrhage, extra-axial fluid collection, or midline shift. There is slight small vessel disease in the centra semiovale bilaterally. Elsewhere gray-white compartments appear normal. No acute infarct evident. Vascular: There is no hyperdense vessel evident. There is calcification in each carotid siphon region. Skull: The bony calvarium appears intact. Sinuses/Orbits: Patient is intubated. There  is opacification of multiple ethmoid  air cells. There is extensive opacification in both maxillary antra. There is increased attenuation throughout much of the opacity in the maxillary antra. Note that frontal sinuses are hypoplastic. Orbits appear symmetric bilaterally. Other: Mastoid air cells are clear. IMPRESSION: Age related volume loss with mild periventricular small vessel disease. No intracranial mass, hemorrhage, or extra-axial fluid collection. No acute infarct evident. There is arterial vascular calcification in the carotid siphon regions bilaterally. Paranasal sinus disease at multiple sites. Increased attenuation in the maxillary antra raises question of hemorrhage within the sinuses or possible subtle calcification. Subtle calcification can be seen with fungal colonization in the sinuses consistent with chronic sinus disease. Electronically Signed   By: Bretta Bang III M.D.   On: 2016/03/10 15:37   Dg Chest Portable 1 View  Result Date: Mar 10, 2016 CLINICAL DATA:  75 year old male with a history of central line placement EXAM: PORTABLE CHEST 1 VIEW COMPARISON:  March 10, 2016, 03/22/2014 FINDINGS: Cardiomediastinal silhouette unchanged. Surgical changes of median sternotomy, CABG. Endotracheal tube unchanged, terminating approximately 2.2 cm above the carina. Interval placement of left IJ approach central venous catheter which terminates superior vena cava. Low lung volumes with linear opacities bilaterally. No pneumothorax. No confluent airspace disease. No large pleural effusion. No displaced fracture. IMPRESSION: Interval placement of left IJ central venous catheter, with the tip appearing to terminate superior vena cava. No pneumothorax. Unchanged endotracheal tube which terminates approximately 2.2 cm above the carina. Surgical changes of prior median sternotomy and CABG. Signed, Yvone Neu. Loreta Ave, DO [facility] Electronically Signed    By: Gilmer Mor D.O.   On: 2016-03-10 16:54   Dg Chest Portable 1 View  Result Date: March 10, 2016 CLINICAL DATA:  Cardiac arrest.  Intubated. EXAM: PORTABLE CHEST 1 VIEW COMPARISON:  03/22/2014 FINDINGS: Endotracheal tube has its tip 2 cm above the carina. There has been previous median sternotomy and CABG. The heart is enlarged. There is aortic atherosclerosis. The lungs are clear. No pulmonary edema. No collapse or effusions. No acute bone finding. IMPRESSION: Endotracheal tube well position with its tip 2 cm above the carina. Previous CABG.  Cardiomegaly.  No active process evident. Electronically Signed   By: Paulina Fusi M.D.   On: 03-10-2016 15:48   Dg Abd Portable 1v  Result Date: 03-10-2016 CLINICAL DATA:  Orogastric tube placement. EXAM: PORTABLE ABDOMEN - 1 VIEW COMPARISON:  None. FINDINGS: An enteric tube has been placed with tip in the left upper quadrant consistent with location in the upper mid stomach. Scattered gas and stool in the colon. Visualized small and large bowel are not distended. Postoperative changes in the mediastinum. Atelectasis in the left lung base. IMPRESSION: Enteric tube tip is in the left upper quadrant consistent with location in the upper mid stomach. Electronically Signed   By: Burman Nieves M.D.   On: 03-10-2016 19:46       Echo 10/7>>>  CULTURES: BC x 2 10/7>>>   ANTIBIOTICS:   SIGNIFICANT EVENTS:   LINES/TUBES: ETT 10/7>>>  DISCUSSION: 75yo male with hx CAD, sCHF, ischemic cardiomyopathy admitted 10/7 after cardiac arrest with total downtime 20-30 minutes including at least 10 mins prior to CPR.    ASSESSMENT / PLAN:  PULMONARY Acute respiratory failure - post cardiac arrest  OSA on CPAP  Pulmonary HTN   - remains n vent P:   Vent support - 8cc/kg  F/u CXR  F/u ABG    CARDIOVASCULAR Cardiac arrest - downtime 20-30 mins. Initial troponin 0.11 Ischemic cardiomyopathy - previous  EF 35-40% CAD - remote CABG 1998 sCHF  AFib     - on neo P:  Cards to see - no plans for cath  Hypothermia protocol  Will defer full dose anticoagulation to cards  Hold home imdur, lasix, metolazone, coreg  Follow serial troponin  RENAL CKD - baseline Scr ~2.0; at risk for an acute renal injury given down time Hypokalemia  Acute lactic acidosis P:   F/u chem and UOP Follow lactate for clearance  GASTROINTESTINAL Nutrition SUP P:   pepcid bid NPO  Initiate TF on 10/8  HEMATOLOGIC  P:  F/u CBC  SQ heparin; as above defer decision for full dose heparin to cardiology  INFECTIOUS No evidence acute infection P:   Monitor wbc  Follow clinically off abx.   ENDOCRINE DM   P:   SSI   NEUROLOGIC AMS - post cardiac arrest.  ??small area of hemorrhage in sinuses, no intraparenchymal bleeding P:   RASS goal: -5 Hypothermia protocol as above  EEG  Fentanyl, versed, nimbex per protocol    FAMILY  - Updates: no family at bedside 03/04/2016   - Inter-disciplinary family meet or Palliative Care meeting due by: 03/10/16     The patient is critically ill with multiple organ systems failure and requires high complexity decision making for assessment and support, frequent evaluation and titration of therapies, application of advanced monitoring technologies and extensive interpretation of multiple databases.   Critical Care Time devoted to patient care services described in this note is  30  Minutes. This time reflects time of care of this signee Dr Kalman ShanMurali Gevork Ayyad. This critical care time does not reflect procedure time, or teaching time or supervisory time of PA/NP/Med student/Med Resident etc but could involve care discussion time    Dr. Kalman ShanMurali Makynzie Dobesh, M.D., [facility]P Pulmonary and Critical Care Medicine Staff Physician [facility] [place] [facility] Pager: [phone], If no answer or between  15:00h - 7:00h: call [phone]  03/04/2016 12:11 PM

## 2016-03-04 NOTE — Progress Notes (Signed)
Patient Name: GABRIEN MENTINK Date of Encounter: 03/04/2016  Active Problems:   Cardiac arrest ([place])   Length of Stay: 1  SUBJECTIVE  Sedated, paralyzed and on cooling protocol.  CURRENT MEDS . artificial tears  1 application Both Eyes E0I  . chlorhexidine gluconate (MEDLINE KIT)  15 mL Mouth Rinse BID  . famotidine (PEPCID) IV  20 mg Intravenous Q12H  . heparin  5,000 Units Subcutaneous Q8H  . insulin aspart  2-6 Units Subcutaneous Q4H  . mouth rinse  15 mL Mouth Rinse 10 times per day  . potassium phosphate IVPB (mmol)  30 mmol Intravenous Once    OBJECTIVE  Vitals:   03/04/16 0600 03/04/16 0700 03/04/16 0800 03/04/16 0809  BP: 133/63  (!) 150/72 140/62  Pulse: (!) 44 (!) 44 (!) 46 (!) 45  Resp: '18 18 18 18  ' Temp: (!) 91.6 F (33.1 C) (!) 91.8 F (33.2 C) (!) 91.8 F (33.2 C)   TempSrc: Core (Comment) Core (Comment)    SpO2: 100% 100% 100% 100%  Weight:      Height:        Intake/Output Summary (Last 24 hours) at 03/04/16 1020 Last data filed at 03/04/16 0700  Gross per 24 hour  Intake          1686.26 ml  Output              625 ml  Net          1061.26 ml   Filed Weights   03/20/2016 1446 03/05/2016 1754  Weight: 157 lb (71.2 kg) 158 lb 12.1 oz (72 kg)   PHYSICAL EXAM  General: sedated, paralyzed, cooled down HEENT:  Endotracheal tube in place, narrow pupils B/L Lungs:  Resp regular and unlabored, CTA. Heart: RRR no s3, s4, or murmurs. Abdomen: Soft, non-tender, non-distended, BS + x 4.  Extremities: No clubbing, cyanosis or edema. DP/PT/Radials weak pulses bilaterally.  Accessory Clinical Findings  CBC  Recent Labs  02/27/2016 1442  03/21/2016 1749  03/04/16 0035 03/04/16 0423  WBC 8.0  --  9.7  --   --  7.2  NEUTROABS 3.2  --   --   --   --   --   HGB 14.2  < > 13.2  < > 13.9 14.1  HCT 45.8  < > 42.1  < > 41.0 44.3  MCV 98.5  --  96.6  --   --  94.5  PLT 92*  --  104*  --   --  73*  < > = values in this interval not displayed. Basic  Metabolic Panel  Recent Labs  03/07/2016 2212  03/04/16 0423 03/04/16 0812  NA  --   < > 140 140  K  --   < > 3.6 3.5  CL  --   < > 111 111  CO2  --   --  19* 19*  GLUCOSE  --   < > 92 106*  BUN  --   < > 32* 31*  CREATININE  --   < > 1.94* 1.97*  CALCIUM  --   --  8.5* 8.5*  MG 2.0  --  2.0  --   PHOS  --   --  <1.0*  --   < > = values in this interval not displayed. Liver Function Tests  Recent Labs  02/27/2016 1442  AST 71*  ALT 47  ALKPHOS 66  BILITOT 0.8  PROT 6.8  ALBUMIN 3.6  Recent Labs  02/26/2016 1442 03/04/16 0054 03/04/16 0423  TROPONINI 0.12* 3.85* 4.75*   Radiology/Studies  Ct Head Wo Contrast  Result Date: 03/26/2016 CLINICAL DATA:  Cardiac arrest with fall EXAM: CT HEAD WITHOUT CONTRAST TECHNIQUE: Contiguous axial images were obtained from the base of the skull through the vertex without intravenous contrast. COMPARISON:  None. FINDINGS: Brain: There is age related volume loss. There is no intracranial mass, hemorrhage, extra-axial fluid collection, or midline shift. There is slight small vessel disease in the centra semiovale bilaterally. Elsewhere gray-white compartments appear normal. No acute infarct evident. Vascular: There is no hyperdense vessel evident. There is calcification in each carotid siphon region. Skull: The bony calvarium appears intact. Sinuses/Orbits: Patient is intubated. There is opacification of multiple ethmoid air cells. There is extensive opacification in both maxillary antra. There is increased attenuation throughout much of the opacity in the maxillary antra. Note that frontal sinuses are hypoplastic. Orbits appear symmetric bilaterally. Other: Mastoid air cells are clear. IMPRESSION: Age related volume loss with mild periventricular small vessel disease. No intracranial mass, hemorrhage, or extra-axial fluid collection. No acute infarct evident. There is arterial vascular calcification in the carotid siphon regions bilaterally.  Paranasal sinus disease at multiple sites. Increased attenuation in the maxillary antra raises question of hemorrhage within the sinuses or possible subtle calcification. Subtle calcification can be seen with fungal colonization in the sinuses consistent with chronic sinus disease. Electronically Signed   By: Lowella Grip III M.D.   On: 03/16/2016 15:37   Dg Chest Portable 1 View  Result Date: 03/27/2016 CLINICAL DATA:  75 year old male with a history of central line placement EXAM: PORTABLE CHEST 1 VIEW COMPARISON:  03/14/2016, 03/22/2014 FINDINGS: Cardiomediastinal silhouette unchanged. Surgical changes of median sternotomy, CABG. Endotracheal tube unchanged, terminating approximately 2.2 cm above the carina. Interval placement of left IJ approach central venous catheter which terminates superior vena cava. Low lung volumes with linear opacities bilaterally. No pneumothorax. No confluent airspace disease. No large pleural effusion. No displaced fracture. IMPRESSION: Interval placement of left IJ central venous catheter, with the tip appearing to terminate superior vena cava. No pneumothorax. Unchanged endotracheal tube which terminates approximately 2.2 cm above the carina. Surgical changes of prior median sternotomy and CABG. Signed, Dulcy Fanny. Earleen Newport, DO [facility] Electronically Signed   By: Corrie Mckusick D.O.   On: 03/10/2016 16:54   Dg Chest Portable 1 View  Result Date: 03/01/2016 CLINICAL DATA:  Cardiac arrest.  Intubated. EXAM: PORTABLE CHEST 1 VIEW COMPARISON:  03/22/2014 FINDINGS: Endotracheal tube has its tip 2 cm above the carina. There has been previous median sternotomy and CABG. The heart is enlarged. There is aortic atherosclerosis. The lungs are clear. No pulmonary edema. No collapse or effusions. No acute bone finding. IMPRESSION: Endotracheal tube well position with its tip 2 cm above the carina. Previous CABG.  Cardiomegaly.   No active process evident. Electronically Signed   By: Maleiyah Releford Chimes M.D.   On: 03/18/2016 15:48   Dg Abd Portable 1v  Result Date: 03/24/2016 CLINICAL DATA:  Orogastric tube placement. EXAM: PORTABLE ABDOMEN - 1 VIEW COMPARISON:  None. FINDINGS: An enteric tube has been placed with tip in the left upper quadrant consistent with location in the upper mid stomach. Scattered gas and stool in the colon. Visualized small and large bowel are not distended. Postoperative changes in the mediastinum. Atelectasis in the left lung base. IMPRESSION: Enteric tube tip is in the left upper quadrant consistent with location  in the upper mid stomach. Electronically Signed   By: Lucienne Capers M.D.   On: 02/26/2016 19:46    TELE: Sinus bradycardia down to 40' and escape ventricular rhythm    ASSESSMENT AND PLAN  75 year old patient of Dr Tamala Julian, last seen on 02/23/2016 with h/o CAD, s/p CABG in 1998, chronic combined systolic and diastolic CHF with LVEF 24-26% on echo in 2015, pulmonary hypertension, chronic LBBB. He has been deteriorating and Dr Tamala Julian was referring for an ICD/BiV evaluation in the EP clinic. He was admitted after witnessed SCD, 10 minutes of no compressions, VT on monitor when EMS arrived, SR after 2 shocks, STEMI cancelled as old LBBB, now cooled down. No neuro recovery known yet, he is in the first 24 H of coolling protocol.  Max troponin 4.75.  We will follow.   Signed, Ena Dawley MD, [facility] 03/04/2016

## 2016-03-05 ENCOUNTER — Inpatient Hospital Stay (HOSPITAL_COMMUNITY): Payer: Medicare Other

## 2016-03-05 DIAGNOSIS — R570 Cardiogenic shock: Secondary | ICD-10-CM | POA: Diagnosis present

## 2016-03-05 DIAGNOSIS — I472 Ventricular tachycardia, unspecified: Secondary | ICD-10-CM

## 2016-03-05 DIAGNOSIS — R7989 Other specified abnormal findings of blood chemistry: Secondary | ICD-10-CM

## 2016-03-05 DIAGNOSIS — I25111 Atherosclerotic heart disease of native coronary artery with angina pectoris with documented spasm: Secondary | ICD-10-CM

## 2016-03-05 DIAGNOSIS — R748 Abnormal levels of other serum enzymes: Secondary | ICD-10-CM

## 2016-03-05 DIAGNOSIS — N183 Chronic kidney disease, stage 3 (moderate): Secondary | ICD-10-CM

## 2016-03-05 DIAGNOSIS — R079 Chest pain, unspecified: Secondary | ICD-10-CM

## 2016-03-05 DIAGNOSIS — R778 Other specified abnormalities of plasma proteins: Secondary | ICD-10-CM

## 2016-03-05 LAB — MAGNESIUM
MAGNESIUM: 2.3 mg/dL (ref 1.7–2.4)
Magnesium: 1.8 mg/dL (ref 1.7–2.4)

## 2016-03-05 LAB — GLUCOSE, CAPILLARY
GLUCOSE-CAPILLARY: 100 mg/dL — AB (ref 65–99)
GLUCOSE-CAPILLARY: 147 mg/dL — AB (ref 65–99)
GLUCOSE-CAPILLARY: 153 mg/dL — AB (ref 65–99)
GLUCOSE-CAPILLARY: 52 mg/dL — AB (ref 65–99)
GLUCOSE-CAPILLARY: 59 mg/dL — AB (ref 65–99)
GLUCOSE-CAPILLARY: 79 mg/dL (ref 65–99)
Glucose-Capillary: 132 mg/dL — ABNORMAL HIGH (ref 65–99)
Glucose-Capillary: 132 mg/dL — ABNORMAL HIGH (ref 65–99)
Glucose-Capillary: 122 mg/dL — ABNORMAL HIGH (ref 65–99)

## 2016-03-05 LAB — BASIC METABOLIC PANEL
ANION GAP: 9 (ref 5–15)
ANION GAP: 9 (ref 5–15)
ANION GAP: 8 (ref 5–15)
ANION GAP: 10 (ref 5–15)
Anion gap: 8 (ref 5–15)
BUN: 31 mg/dL — ABNORMAL HIGH (ref 6–20)
BUN: 33 mg/dL — ABNORMAL HIGH (ref 6–20)
BUN: 32 mg/dL — AB (ref 6–20)
BUN: 34 mg/dL — ABNORMAL HIGH (ref 6–20)
BUN: 34 mg/dL — ABNORMAL HIGH (ref 6–20)
CHLORIDE: 108 mmol/L (ref 101–111)
CHLORIDE: 108 mmol/L (ref 101–111)
CHLORIDE: 106 mmol/L (ref 101–111)
CO2: 18 mmol/L — ABNORMAL LOW (ref 22–32)
CO2: 19 mmol/L — AB (ref 22–32)
CO2: 20 mmol/L — ABNORMAL LOW (ref 22–32)
CO2: 21 mmol/L — AB (ref 22–32)
CO2: 21 mmol/L — ABNORMAL LOW (ref 22–32)
Calcium: 8.4 mg/dL — ABNORMAL LOW (ref 8.9–10.3)
Calcium: 8.6 mg/dL — ABNORMAL LOW (ref 8.9–10.3)
Calcium: 8.9 mg/dL (ref 8.9–10.3)
Calcium: 8.8 mg/dL — ABNORMAL LOW (ref 8.9–10.3)
Calcium: 8.7 mg/dL — ABNORMAL LOW (ref 8.9–10.3)
Chloride: 110 mmol/L (ref 101–111)
Chloride: 108 mmol/L (ref 101–111)
Creatinine, Ser: 1.96 mg/dL — ABNORMAL HIGH (ref 0.61–1.24)
Creatinine, Ser: 1.96 mg/dL — ABNORMAL HIGH (ref 0.61–1.24)
Creatinine, Ser: 2.3 mg/dL — ABNORMAL HIGH (ref 0.61–1.24)
Creatinine, Ser: 2.4 mg/dL — ABNORMAL HIGH (ref 0.61–1.24)
Creatinine, Ser: 2.44 mg/dL — ABNORMAL HIGH (ref 0.61–1.24)
GFR calc Af Amer: 37 mL/min — ABNORMAL LOW (ref >60)
GFR calc Af Amer: 37 mL/min — ABNORMAL LOW (ref >60)
GFR calc Af Amer: 31 mL/min — ABNORMAL LOW (ref >60)
GFR calc Af Amer: 29 mL/min — ABNORMAL LOW (ref >60)
GFR calc Af Amer: 29 mL/min — ABNORMAL LOW (ref >60)
GFR calc non Af Amer: 32 mL/min — ABNORMAL LOW (ref >60)
GFR calc non Af Amer: 32 mL/min — ABNORMAL LOW (ref >60)
GFR, EST NON AFRICAN AMERICAN: 27 mL/min — AB (ref >60)
GFR, EST NON AFRICAN AMERICAN: 25 mL/min — AB (ref >60)
GFR, EST NON AFRICAN AMERICAN: 25 mL/min — AB (ref >60)
GLUCOSE: 161 mg/dL — AB (ref 65–99)
GLUCOSE: 130 mg/dL — AB (ref 65–99)
GLUCOSE: 65 mg/dL (ref 65–99)
GLUCOSE: 123 mg/dL — AB (ref 65–99)
Glucose, Bld: 159 mg/dL — ABNORMAL HIGH (ref 65–99)
POTASSIUM: 4.8 mmol/L (ref 3.5–5.1)
POTASSIUM: 5.3 mmol/L — AB (ref 3.5–5.1)
POTASSIUM: 4.9 mmol/L (ref 3.5–5.1)
POTASSIUM: 5.2 mmol/L — AB (ref 3.5–5.1)
Potassium: 4 mmol/L (ref 3.5–5.1)
Sodium: 136 mmol/L (ref 135–145)
Sodium: 136 mmol/L (ref 135–145)
Sodium: 138 mmol/L (ref 135–145)
Sodium: 137 mmol/L (ref 135–145)
Sodium: 136 mmol/L (ref 135–145)

## 2016-03-05 LAB — PHOSPHORUS
Phosphorus: 3 mg/dL (ref 2.5–4.6)
Phosphorus: 4.8 mg/dL — ABNORMAL HIGH (ref 2.5–4.6)

## 2016-03-05 LAB — CBC WITH DIFFERENTIAL/PLATELET
BASOS ABS: 0 10*3/uL (ref 0.0–0.1)
Basophils Relative: 0 %
Eosinophils Absolute: 0 10*3/uL (ref 0.0–0.7)
Eosinophils Relative: 0 %
HEMATOCRIT: 47.7 % (ref 39.0–52.0)
HEMOGLOBIN: 15.3 g/dL (ref 13.0–17.0)
LYMPHS ABS: 1 10*3/uL (ref 0.7–4.0)
LYMPHS PCT: 9 %
MCH: 30.2 pg (ref 26.0–34.0)
MCHC: 32.1 g/dL (ref 30.0–36.0)
MCV: 94.1 fL (ref 78.0–100.0)
Monocytes Absolute: 0.7 10*3/uL (ref 0.1–1.0)
Monocytes Relative: 7 %
NEUTROS ABS: 8.8 10*3/uL — AB (ref 1.7–7.7)
NEUTROS PCT: 84 %
PLATELETS: 115 10*3/uL — AB (ref 150–400)
RBC: 5.07 MIL/uL (ref 4.22–5.81)
RDW: 17.1 % — ABNORMAL HIGH (ref 11.5–15.5)
WBC: 10.4 10*3/uL (ref 4.0–10.5)

## 2016-03-05 LAB — LACTIC ACID, PLASMA: LACTIC ACID, VENOUS: 1.4 mmol/L (ref 0.5–1.9)

## 2016-03-05 LAB — ECHOCARDIOGRAM COMPLETE
Height: 67 in
Weight: 2540.05 oz

## 2016-03-05 LAB — TROPONIN I: TROPONIN I: 4.32 ng/mL — AB (ref <0.03)

## 2016-03-05 MED ORDER — DEXTROSE 50 % IV SOLN
INTRAVENOUS | Status: AC
  Filled 2016-03-05: qty 50

## 2016-03-05 MED ORDER — MAGNESIUM SULFATE 2 GM/50ML IV SOLN
2.0000 g | Freq: Once | INTRAVENOUS | Status: AC
  Administered 2016-03-05: 2 g via INTRAVENOUS
  Filled 2016-03-05: qty 50

## 2016-03-05 MED ORDER — DOPAMINE-DEXTROSE 3.2-5 MG/ML-% IV SOLN
INTRAVENOUS | Status: AC
  Administered 2016-03-05: 800 mg
  Filled 2016-03-05: qty 250

## 2016-03-05 MED ORDER — NOREPINEPHRINE BITARTRATE 1 MG/ML IV SOLN
0.0000 ug/min | INTRAVENOUS | Status: DC
  Administered 2016-03-05: 2 ug/min via INTRAVENOUS
  Administered 2016-03-05: 5 ug/min via INTRAVENOUS
  Filled 2016-03-05: qty 16

## 2016-03-05 MED ORDER — DOPAMINE-DEXTROSE 3.2-5 MG/ML-% IV SOLN
0.0000 ug/kg/min | INTRAVENOUS | Status: DC
  Administered 2016-03-05: 5 ug/kg/min via INTRAVENOUS

## 2016-03-05 MED ORDER — DEXTROSE 5 % IV SOLN: 0.0000 ug/min | INTRAVENOUS | Status: DC

## 2016-03-05 MED ORDER — DEXTROSE 50 % IV SOLN
25.0000 mL | Freq: Once | INTRAVENOUS | Status: AC
  Administered 2016-03-05: 25 mL via INTRAVENOUS

## 2016-03-05 MED ORDER — VITAL AF 1.2 CAL PO LIQD
1000.0000 mL | ORAL | Status: DC
  Administered 2016-03-05: 1000 mL

## 2016-03-05 MED FILL — Medication: Qty: 1 | Status: AC

## 2016-03-05 NOTE — Progress Notes (Signed)
PULMONARY / CRITICAL CARE MEDICINE   Name: Ethan Vargas MRN: [id number] DOB: [birth year]    ADMISSION DATE:  02/28/2016  REFERRING MD:  EDP   CHIEF COMPLAINT:  Post arrest   BRIEF 75yo male with hx AFib, CAD, CKD III, DM, PVD, OSA, chronic sCHF, ischemic cardiomyopathy with EF 35-40%, presented 10/7 after being found down at [facility].  Had probable 10-20 mins downtime prior to CPR.  Fire arrived and AED advised shock x1, was asystole on EMS arrival with 8-37mins CPR prior to ROSC so total 20-52mins downtime.  Intubated in field by EMS.  Initially had some ST elevation on EKG and code STEMI was called, but on cardiology evaluation there was no further ST elevation, pt was hypotensive and decision was made not to take to cath lab based on prolonged downtime and overall instability. No purposeful movement noted post ROSC.  PCCM consulted for admit and hypothermia protocol.    SUBJECTIVE:  Almost warmed, dropped SBP to 50 mmHg this AM requiring addition of more pressors.  VITAL SIGNS: BP 109/67   Pulse (!) 50   Temp 97 F (36.1 C) (Core (Comment))   Resp 18   Ht 5\' 7"  (1.702 m)   Wt 72 kg (158 lb 12.1 oz)   SpO2 100%   BMI 24.86 kg/m   HEMODYNAMICS: CVP:  [7 mmHg-17 mmHg] 12 mmHg  VENTILATOR SETTINGS: Vent Mode: PRVC FiO2 (%):  [40 %] 40 % Set Rate:  [18 bmp] 18 bmp Vt Set:  [530 mL] 530 mL PEEP:  [5 cmH20] 5 cmH20 Plateau Pressure:  [20 cmH20-23 cmH20] 22 cmH20  INTAKE / OUTPUT: I/O last 3 completed shifts: In: 4229.4 [I.V.:3369.4; IV Piggyback:860] Out: 1520 [Urine:1120; Emesis/NG output:400]  PHYSICAL EXAMINATION: General:  wdwn older male, acutely ill appearing  Neuro:  Sedated and paralyzed, unable to assess. HEENT:  Mm moist, no JVD, ETT in place. Cardiovascular:  HR in the 50's, regular, Nl S1/S2, -M/R/G. Lungs:  Resp even non labored on vent, diminished L. Abdomen:  Round, soft, +bs  Musculoskeletal:  Warm and dry, no edema   LABS: PULMONARY  Recent  Labs Lab 03/08/2016 1541 03/10/2016 1607 03/04/2016 1747 03/02/2016 2003 03/04/16 0035 03/04/16 0435  PHART  --  7.327*  --   --   --  7.502*  PCO2ART  --  49.5*  --   --   --  24.6*  PO2ART  --  279.0*  --   --   --  212*  HCO3  --  25.9  --   --   --  20.3  TCO2 26 27 26 24 25   --   O2SAT  --  100.0  --   --   --  99.7   CBC  Recent Labs Lab 03/05/2016 1749  03/04/16 0035 03/04/16 0423 03/05/16 0404  HGB 13.2  < > 13.9 14.1 15.3  HCT 42.1  < > 41.0 44.3 47.7  WBC 9.7  --   --  7.2 10.4  PLT 104*  --   --  73* 115*  < > = values in this interval not displayed.  COAGULATION  Recent Labs Lab 03/05/2016 1442 03/04/16 0000  INR 1.17 1.28   CARDIAC   Recent Labs Lab 03/07/2016 1442 03/04/16 0054 03/04/16 0423 03/04/16 1313 03/05/16 0404  TROPONINI 0.12* 3.85* 4.75* 4.96* 4.32*   No results for input(s): PROBNP in the last 168 hours.  CHEMISTRY  Recent Labs Lab 03/22/2016 1442  03/23/2016 2212  03/04/16 0423  03/04/16 1313 03/04/16 1604 03/04/16 2033 03/05/16 0000 03/05/16 0404  NA 141  < >  --   < > 140  < > 138 139 135 136 136  K 3.3*  < >  --   < > 3.6  < > 3.6 4.1 3.8 4.0 4.8  CL 103  < >  --   < > 111  < > 110 113* 109 110 108  CO2 27  --   --   --  19*  < > 19* 18* 17* 18* 19*  GLUCOSE 172*  < >  --   < > 92  < > 138* 108* 346* 159* 161*  BUN 33*  < >  --   < > 32*  < > 31* 31* 31* 31* 32*  CREATININE 2.18*  < >  --   < > 1.94*  < > 1.97* 1.92* 1.90* 1.96* 1.96*  CALCIUM 9.1  --   --   --  8.5*  < > 8.3* 8.6* 8.0* 8.4* 8.6*  MG 2.4  --  2.0  --  2.0  --   --   --   --   --  1.8  PHOS  --   --   --   --  <1.0*  --   --   --   --   --  3.0  < > = values in this interval not displayed. Estimated Creatinine Clearance: 31.4 mL/min (by C-G formula based on SCr of 1.96 mg/dL (H)).  LIVER  Recent Labs Lab Oct 17, 2015 1442 03/04/16 0000  AST 71*  --   ALT 47  --   ALKPHOS 66  --   BILITOT 0.8  --   PROT 6.8  --   ALBUMIN 3.6  --   INR 1.17 1.28    INFECTIOUS  Recent Labs Lab Oct 17, 2015 1541 Oct 17, 2015 2212 03/05/16 0000  LATICACIDVEN 5.90* 1.3 1.4   ENDOCRINE CBG (last 3)   Recent Labs  03/05/16 0012 03/05/16 0404 03/05/16 0738  GLUCAP 147* 153* 132*   IMAGING x48h  - image(s) personally visualized  -   highlighted in bold Ct Head Wo Contrast  Result Date: 03/18/2016 CLINICAL DATA:  Cardiac arrest with fall EXAM: CT HEAD WITHOUT CONTRAST TECHNIQUE: Contiguous axial images were obtained from the base of the skull through the vertex without intravenous contrast. COMPARISON:  None. FINDINGS: Brain: There is age related volume loss. There is no intracranial mass, hemorrhage, extra-axial fluid collection, or midline shift. There is slight small vessel disease in the centra semiovale bilaterally. Elsewhere gray-white compartments appear normal. No acute infarct evident. Vascular: There is no hyperdense vessel evident. There is calcification in each carotid siphon region. Skull: The bony calvarium appears intact. Sinuses/Orbits: Patient is intubated. There is opacification of multiple ethmoid air cells. There is extensive opacification in both maxillary antra. There is increased attenuation throughout much of the opacity in the maxillary antra. Note that frontal sinuses are hypoplastic. Orbits appear symmetric bilaterally. Other: Mastoid air cells are clear. IMPRESSION: Age related volume loss with mild periventricular small vessel disease. No intracranial mass, hemorrhage, or extra-axial fluid collection. No acute infarct evident. There is arterial vascular calcification in the carotid siphon regions bilaterally. Paranasal sinus disease at multiple sites. Increased attenuation in the maxillary antra raises question of hemorrhage within the sinuses or possible subtle calcification. Subtle calcification can be seen with fungal colonization in the sinuses consistent with chronic sinus disease. Electronically Signed   By: Bretta BangWilliam  Woodruff  III  M.D.   On: 09-Mar-2016 15:37   Dg Chest Port 1 View  Result Date: 03/05/2016 CLINICAL DATA:  Endotracheal tube placement, shortness of breath EXAM: PORTABLE CHEST 1 VIEW COMPARISON:  03/09/2016 FINDINGS: There is an endotracheal tube with the tip 3.8 cm above the carina. There is a left jugular central venous catheter with the tip projecting over the SVC. There is a nasogastric tube coursing below the diaphragm. There is a trace right pleural effusion. There is right lower lobe airspace disease concerning for pneumonia. There is no pneumothorax. There is stable cardiomegaly. There is evidence of prior CABG. The osseous structures are unremarkable. IMPRESSION: 1. Endotracheal tube with the tip 3.8 cm above the carina. 2. Right lower lobe airspace disease concerning for pneumonia. Electronically Signed   By: Elige Ko   On: 03/05/2016 07:09   Dg Chest Portable 1 View  Result Date: March 09, 2016 CLINICAL DATA:  75 year old male with a history of central line placement EXAM: PORTABLE CHEST 1 VIEW COMPARISON:  03-09-2016, 03/22/2014 FINDINGS: Cardiomediastinal silhouette unchanged. Surgical changes of median sternotomy, CABG. Endotracheal tube unchanged, terminating approximately 2.2 cm above the carina. Interval placement of left IJ approach central venous catheter which terminates superior vena cava. Low lung volumes with linear opacities bilaterally. No pneumothorax. No confluent airspace disease. No large pleural effusion. No displaced fracture. IMPRESSION: Interval placement of left IJ central venous catheter, with the tip appearing to terminate superior vena cava. No pneumothorax. Unchanged endotracheal tube which terminates approximately 2.2 cm above the carina. Surgical changes of prior median sternotomy and CABG. Signed, Yvone Neu. Loreta Ave, DO [facility] Electronically Signed   By: Gilmer Mor D.O.   On: Mar 09, 2016 16:54   Dg Chest Portable 1  View  Result Date: 03-09-16 CLINICAL DATA:  Cardiac arrest.  Intubated. EXAM: PORTABLE CHEST 1 VIEW COMPARISON:  03/22/2014 FINDINGS: Endotracheal tube has its tip 2 cm above the carina. There has been previous median sternotomy and CABG. The heart is enlarged. There is aortic atherosclerosis. The lungs are clear. No pulmonary edema. No collapse or effusions. No acute bone finding. IMPRESSION: Endotracheal tube well position with its tip 2 cm above the carina. Previous CABG.  Cardiomegaly.  No active process evident. Electronically Signed   By: Paulina Fusi M.D.   On: March 09, 2016 15:48   Dg Abd Portable 1v  Result Date: 03/09/2016 CLINICAL DATA:  Orogastric tube placement. EXAM: PORTABLE ABDOMEN - 1 VIEW COMPARISON:  None. FINDINGS: An enteric tube has been placed with tip in the left upper quadrant consistent with location in the upper mid stomach. Scattered gas and stool in the colon. Visualized small and large bowel are not distended. Postoperative changes in the mediastinum. Atelectasis in the left lung base. IMPRESSION: Enteric tube tip is in the left upper quadrant consistent with location in the upper mid stomach. Electronically Signed   By: Burman Nieves M.D.   On: 2016/03/09 19:46   Echo 10/7>>>  CULTURES: BC x 2 10/7>>>   ANTIBIOTICS:   SIGNIFICANT EVENTS:   LINES/TUBES: ETT 10/7>>>   DISCUSSION: 75yo male with hx CAD, sCHF, ischemic cardiomyopathy admitted 10/7 after cardiac arrest with total downtime 20-30 minutes including at least 10 mins prior to CPR.    ASSESSMENT / PLAN:  PULMONARY Acute respiratory failure - post cardiac arrest  OSA on CPAP  Pulmonary HTN  P:   Full vent support - 8cc/kg  F/u CXR  F/u ABG Titrate O2 for sat of 88-92%.  CARDIOVASCULAR Cardiac arrest - downtime 20-30 mins. Initial troponin 0.11 Ischemic cardiomyopathy - previous EF 35-40% CAD - remote CABG 1998 sCHF  AFib  Sudden episode of hypotension this AM, CVP 23 and HR of  50  P:  Cards to see - no plans for cath  Hypothermia protocol being warmed Anticoagulation per cards  Hold home imdur, lasix, metolazone, coreg given hypotension Add dopamine for HR and BP support. Follow serial troponin.  RENAL CKD - baseline Scr ~2.0; at risk for an acute renal injury given down time Hypokalemia  Acute lactic acidosis P:   F/u chem and UOP. Replace electrolytes as indicated. BMET in AM. KVO IVF  GASTROINTESTINAL Nutrition SUP P:   Pepcid bid. NPO. Initiate TF on 10/8, continue for now.  HEMATOLOGIC  P:  F/u CBC  SQ heparin; as above defer decision for full dose heparin to cardiology  INFECTIOUS No evidence acute infection P:   Monitor wbc  Follow clinically off abx.   ENDOCRINE DM   P:   SSI   NEUROLOGIC AMS - post cardiac arrest.  ??small area of hemorrhage in sinuses, no intraparenchymal bleeding P:   RASS goal: -2 that we are rewarming EEG peding D/C paralytics and sedation, pending neuro exam will decide on need for neuro.  FAMILY  - Updates: no family at bedside 03/04/2016  - Inter-disciplinary family meet or Palliative Care meeting due by: 03/10/16  The patient is critically ill with multiple organ systems failure and requires high complexity decision making for assessment and support, frequent evaluation and titration of therapies, application of advanced monitoring technologies and extensive interpretation of multiple databases.   Critical Care Time devoted to patient care services described in this note is  35  Minutes. This time reflects time of care of this signee Dr Koren Bound. This critical care time does not reflect procedure time, or teaching time or supervisory time of PA/NP/Med student/Med Resident etc but could involve care discussion time.  Alyson Reedy, M.D. [facility] Pulmonary/Critical Care Medicine. Pager: [phone]. After hours pager: [phone].  03/05/2016 9:29 AM

## 2016-03-05 NOTE — Progress Notes (Signed)
Initial Nutrition Assessment  INTERVENTION:   Vital AF 1.2 @ 60 ml/hr (1440 ml/day) Provides: 1728 kcal, 108 grams protein, and 1167 ml H2O.    NUTRITION DIAGNOSIS:   Inadequate oral intake related to inability to eat as evidenced by NPO status.  GOAL:   Patient will meet greater than or equal to 90% of their needs  MONITOR:   TF tolerance, I & O's, Vent status  REASON FOR ASSESSMENT:   Consult Enteral/tube feeding initiation and management  ASSESSMENT:   75yo male with hx AFib, CAD, CKD III, DM, PVD, OSA, chronic sCHF, ischemic cardiomyopathy with EF 35-40%, [facility]   Discussed with RN.   Patient is currently intubated on ventilator support MV: 11.2 L/min Temp (24hrs), Avg:93.7 F (34.3 C), Min:90.3 F (32.4 C), Max:99 F (37.2 C)  Labs reviewed: K+ 5.3 CBG's: 132 Nutrition-Focused physical exam completed. Findings are no fat depletion, no muscle depletion, and moderate edema.  OG tube tip in upper mid stomach   Diet Order:     Skin:  Reviewed, no issues  Last BM:  unknown  Height:   Ht Readings from Last 1 Encounters:  03/14/2016 5\' 7"  (1.702 m)    Weight:   Wt Readings from Last 1 Encounters:  03/02/2016 158 lb 12.1 oz (72 kg)    Ideal Body Weight:  67.2 kg  BMI:  Body mass index is 24.86 kg/m.  Estimated Nutritional Needs:   Kcal:  1719  Protein:  90-110 grams  Fluid:  > 1.7 L/day  EDUCATION NEEDS:   No education needs identified at this time  Kendell BaneHeather Tamieka Rancourt RD, LDN, CNSC [phone] Pager [phone] After Hours Pager

## 2016-03-05 NOTE — Procedures (Signed)
ELECTROENCEPHALOGRAM REPORT  Date of Study: 03/05/2016  Patient's Name: Ethan Vargas MRN: [id number] Date of Birth: [birth year]  Referring Provider: Dr. Danford BadKathryn Whiteheart  Clinical History: This is a 75 year old man s/p cardiac arrest, off sedation 1.5 hours prior to start of EEG recording  Medications: DOPamine (INTROPIN) 800 mg in dextrose 5 % 250 mL (3.2 mg/mL) infusion  famotidine (PEPCID) IVPB 20 mg premix  insulin aspart (novoLOG) injection 2-6 Units  norepinephrine (LEVOPHED) 16 mg in dextrose 5 % 250 mL (0.064 mg/mL) infusion  phenylephrine (NEO-SYNEPHRINE) 40 mg in dextrose 5 % 250 mL (0.16 mg/mL) infusion   Technical Summary: A multichannel digital EEG recording measured by the international 10-20 system with electrodes applied with paste and impedances below 5000 ohms performed as portable with EKG monitoring in an intubated and unresponsive patient.  Hyperventilation and photic stimulation were not performed.  The digital EEG was referentially recorded, reformatted, and digitally filtered in a variety of bipolar and referential montages for optimal display.   Description: The patient is intubated and unresponsive during the recording. There is no clear posterior dominant rhythm. The background consists of a large amount of diffuse 4-5 Hz theta and 2-3 Hz delta slowing. Normal sleep architecture is not seen. There is no reactivity to noxious stimulation. Hyperventilation and photic stimulation were not performed.  There were no epileptiform discharges or electrographic seizures seen.    EKG lead showed occasional extrasystolic beats.  Impression: This EEG is abnormal due to moderate diffuse slowing of the background.  Clinical Correlation of the above findings indicates diffuse cerebral dysfunction that is non-specific in etiology and can be seen with hypoxic/ischemic injury, toxic/metabolic encephalopathies, or medication effect.  There were no electrographic seizures  seen in this study.  Patrcia DollyKaren Janeliz Prestwood, M.D.

## 2016-03-05 NOTE — Progress Notes (Signed)
*  PRELIMINARY RESULTS* Echocardiogram 2D Echocardiogram has been performed.  Cristela BlueHege, Jerricka Carvey 03/05/2016, 8:51 AM

## 2016-03-05 NOTE — Progress Notes (Signed)
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   Requiring 100 mcg/min Neo on gtt to maintain stable MAP While still on cooling protocol, was having intermittent Junctional escape rhythm with drop in BP -- less prominent with re-warming.  Inpatient Medications    . artificial tears  1 application Both Eyes K5T  . chlorhexidine gluconate (MEDLINE KIT)  15 mL Mouth Rinse BID  . famotidine (PEPCID) IV  20 mg Intravenous Q12H  . heparin  5,000 Units Subcutaneous Q8H  . insulin aspart  2-6 Units Subcutaneous Q4H  . mouth rinse  15 mL Mouth Rinse 10 times per day    Vital Signs    Vitals:   03/05/16 0500 03/05/16 0600 03/05/16 0700 03/05/16 0737  BP: 118/70 112/68 109/67   Pulse: (!) 48 (!) 51 (!) 50   Resp: '18 18 18   ' Temp: (!) 95 F (35 C) (!) 95.5 F (35.3 C)  97 F (36.1 C)  TempSrc: Core (Comment) Core (Comment)  Core (Comment)  SpO2: 100% 100% 100%   Weight:      Height:        Intake/Output Summary (Last 24 hours) at 03/05/16 0807 Last data filed at 03/05/16 0700  Gross per 24 hour  Intake          2034.69 ml  Output              520 ml  Net          1514.69 ml   Filed Weights   03/25/2016 1446 03/09/2016 1754  Weight: 71.2 kg (157 lb) 72 kg (158 lb 12.1 oz)    Physical Exam   GEN: Intubated &* sedated.  Unable to open eyes.  Still unresponsive. HEENT: Grossly normal.  Neck: Supple, no JVD, carotid bruits Cardiac: Distant sounds -  Bradycardic with normal S1& S2. No M/R/G. No clubbing, cyanosis - ~2 + BLE edema.  Radials/DP/PT 2+ and equal bilaterally.  Respiratory:  Ventilator breath sounds - but no obvious rales GI: Soft, nontender, nondistended, BS + x 4. Skin: cool, no rash Neuro: sedated  Labs    CBC  Recent Labs  03/20/2016 1442  03/04/16 0423 03/05/16 0404  WBC 8.0  < > 7.2 10.4  NEUTROABS 3.2  --   --  8.8*  HGB 14.2  < > 14.1 15.3  HCT 45.8  < > 44.3 47.7  MCV 98.5  < > 94.5 94.1  PLT 92*  < > 73* 115*  < > = values in this interval not displayed. Basic Metabolic Panel  Recent Labs  03/04/16 0423  03/05/16 0000 03/05/16 0404  NA 140  < > 136 136  K 3.6  < > 4.0 4.8  CL 111  < > 110 108  CO2 19*  < > 18* 19*  GLUCOSE 92  < > 159* 161*  BUN 32*  < > 31* 32*  CREATININE 1.94*  < > 1.96* 1.96*  CALCIUM 8.5*  < >  8.4* 8.6*  MG 2.0  --   --  1.8  PHOS <1.0*  --   --  3.0  < > = values in this interval not displayed. Liver Function Tests  Recent Labs  03/16/2016 1442  AST 71*  ALT 47  ALKPHOS 66  BILITOT 0.8  PROT 6.8  ALBUMIN 3.6   No results for input(s): LIPASE, AMYLASE in the last 72 hours. Cardiac Enzymes  Recent Labs  03/04/16 0423 03/04/16 1313 03/05/16 0404  TROPONINI 4.75* 4.96* 4.32*   BNP Invalid input(s): POCBNP D-Dimer No results for input(s): DDIMER in the last 72 hours. Hemoglobin A1C No results for input(s): HGBA1C in the last 72 hours. Fasting Lipid Panel No results for input(s): CHOL, HDL, LDLCALC, TRIG, CHOLHDL, LDLDIRECT in the last 72 hours. Thyroid Function Tests No results for input(s): TSH, T4TOTAL, T3FREE, THYROIDAB in the last 72 hours.  Invalid input(s): [employer] in 50s, intermittent Jxn escape rhythm in 101s.  ECG    n/a  Radiology    Ct Head Wo Contrast  Result Date: 03/11/2016 CLINICAL DATA:  Cardiac arrest with fall EXAM: CT HEAD WITHOUT CONTRAST TECHNIQUE: Contiguous axial images were obtained from the base of  the skull through the vertex without intravenous contrast. COMPARISON:  None. FINDINGS: Brain: There is age related volume loss. There is no intracranial mass, hemorrhage, extra-axial fluid collection, or midline shift. There is slight small vessel disease in the centra semiovale bilaterally. Elsewhere gray-white compartments appear normal. No acute infarct evident. Vascular: There is no hyperdense vessel evident. There is calcification in each carotid siphon region. Skull: The bony calvarium appears intact. Sinuses/Orbits: Patient is intubated. There is opacification of multiple ethmoid air cells. There is extensive opacification in both maxillary antra. There is increased attenuation throughout much of the opacity in the maxillary antra. Note that frontal sinuses are hypoplastic. Orbits appear symmetric bilaterally. Other: Mastoid air cells are clear. IMPRESSION: Age related volume loss with mild periventricular small vessel disease. No intracranial mass, hemorrhage, or extra-axial fluid collection. No acute infarct evident. There is arterial vascular calcification in the carotid siphon regions bilaterally. Paranasal sinus disease at multiple sites. Increased attenuation in the maxillary antra raises question of hemorrhage within the sinuses or possible subtle calcification. Subtle calcification can be seen with fungal colonization in the sinuses consistent with chronic sinus disease. Electronically Signed   By: Lowella Grip III M.D.   On: 03/10/2016 15:37   Dg Chest Port 1 View  Result Date: 03/05/2016 CLINICAL DATA:  Endotracheal tube placement, shortness of breath EXAM: PORTABLE CHEST 1 VIEW COMPARISON:  02/26/2016 FINDINGS: There is an endotracheal tube with the tip 3.8 cm above the carina. There is a left jugular central venous catheter with the tip projecting over the SVC. There is a nasogastric tube coursing below the diaphragm. There is a trace right pleural effusion. There is right lower lobe  airspace disease concerning for pneumonia. There is no pneumothorax. There is stable cardiomegaly. There is evidence of prior CABG. The osseous structures are unremarkable. IMPRESSION: 1. Endotracheal tube with the tip 3.8 cm above the carina. 2. Right lower lobe airspace disease concerning for pneumonia. Electronically Signed   By: Kathreen Devoid   On: 03/05/2016 07:09   Dg Chest Portable 1 View  Result Date: 03/09/2016 CLINICAL DATA:  75 year old male with a history of central line placement EXAM: PORTABLE CHEST 1 VIEW COMPARISON:  02/28/2016, 03/22/2014 FINDINGS: Cardiomediastinal silhouette unchanged. Surgical changes of median sternotomy,  CABG. Endotracheal tube unchanged, terminating approximately 2.2 cm above the carina. Interval placement of left IJ approach central venous catheter which terminates superior vena cava. Low lung volumes with linear opacities bilaterally. No pneumothorax. No confluent airspace disease. No large pleural effusion. No displaced fracture. IMPRESSION: Interval placement of left IJ central venous catheter, with the tip appearing to terminate superior vena cava. No pneumothorax. Unchanged endotracheal tube which terminates approximately 2.2 cm above the carina. Surgical changes of prior median sternotomy and CABG. Signed, Dulcy Fanny. Earleen Newport, DO [facility] Radiology Electronically Signed   By: Corrie Mckusick D.O.   On: 03/16/2016 16:54   Dg Chest Portable 1 View  Result Date: 03/01/2016 CLINICAL DATA:  Cardiac arrest.  Intubated. EXAM: PORTABLE CHEST 1 VIEW COMPARISON:  03/22/2014 FINDINGS: Endotracheal tube has its tip 2 cm above the carina. There has been previous median sternotomy and CABG. The heart is enlarged. There is aortic atherosclerosis. The lungs are clear. No pulmonary edema. No collapse or effusions. No acute bone finding. IMPRESSION: Endotracheal tube well position with its tip 2 cm above the carina. Previous CABG.   Cardiomegaly.  No active process evident. Electronically Signed   By: Nelson Chimes M.D.   On: 03/26/2016 15:48   Dg Abd Portable 1v  Result Date: 03/04/2016 CLINICAL DATA:  Orogastric tube placement. EXAM: PORTABLE ABDOMEN - 1 VIEW COMPARISON:  None. FINDINGS: An enteric tube has been placed with tip in the left upper quadrant consistent with location in the upper mid stomach. Scattered gas and stool in the colon. Visualized small and large bowel are not distended. Postoperative changes in the mediastinum. Atelectasis in the left lung base. IMPRESSION: Enteric tube tip is in the left upper quadrant consistent with location in the upper mid stomach. Electronically Signed   By: Lucienne Capers M.D.   On: 03/24/2016 19:46    Patient Profile     75 year old patient of Dr Tamala Julian, last seen on 02/23/2016 with h/o CAD, s/p CABG in 1998, chronic combined systolic and diastolic CHF with LVEF 82-42% on echo in 2015, pulmonary hypertension, chronic LBBB. He has been deteriorating and Dr Tamala Julian was referring for an ICD/BiV evaluation in the EP clinic. He was admitted PM of 03/14/2016 after witnessed SCD, 10 minutes of no compressions, VT on monitor when EMS arrived, SR after 2 shocks, STEMI cancelled as old LBBB, now cooled down.  Assessment & Plan    Principal Problem:   Cardiac arrest [facility]) /  Ventricular tachycardia ([place]) - prolonged "witnessed arrest", long down time.  On cooling protocol  Neuro following  Active Problems:     Coronary artery disease involving native coronary artery of native heart with angina pectoris with documented spasm (HCC) -- Elevated Troponin - given trend of Troponin levels, most /w demand ischemia.  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 (may need RHC to help with management if  course is prolonged.  - Echo pending      Acute respiratory failure with hypoxia ([place]) - remains intubated, managed by PCCM     Chronic kidney disease (CKD), stage III-IV (moderate) - stable creatinine (Net ++ with modest UOP)   Awaiting Neurologic recovery. Will follow.  Signed,  Glenetta Hew, M.D., M.S. Interventional Cardiologist   Pager # [phone] Phone #  [address] [place], [place] [address]   03/05/2016, 8:07 AM

## 2016-03-05 NOTE — Progress Notes (Signed)
Bedside EEG completed, results pending. 

## 2016-03-06 ENCOUNTER — Inpatient Hospital Stay (HOSPITAL_COMMUNITY): Payer: Medicare Other

## 2016-03-06 DIAGNOSIS — G931 Anoxic brain damage, not elsewhere classified: Secondary | ICD-10-CM

## 2016-03-06 DIAGNOSIS — I25110 Atherosclerotic heart disease of native coronary artery with unstable angina pectoris: Secondary | ICD-10-CM

## 2016-03-06 DIAGNOSIS — I420 Dilated cardiomyopathy: Secondary | ICD-10-CM | POA: Clinically undetermined

## 2016-03-06 DIAGNOSIS — I2729 Other secondary pulmonary hypertension: Secondary | ICD-10-CM

## 2016-03-06 LAB — CBC WITH DIFFERENTIAL/PLATELET
BASOS ABS: 0 10*3/uL (ref 0.0–0.1)
BASOS PCT: 0 %
EOS PCT: 0 %
Eosinophils Absolute: 0 10*3/uL (ref 0.0–0.7)
HEMATOCRIT: 45.2 % (ref 39.0–52.0)
Hemoglobin: 14.3 g/dL (ref 13.0–17.0)
Lymphocytes Relative: 9 %
Lymphs Abs: 1.1 10*3/uL (ref 0.7–4.0)
MCH: 30.4 pg (ref 26.0–34.0)
MCHC: 31.6 g/dL (ref 30.0–36.0)
MCV: 96 fL (ref 78.0–100.0)
MONO ABS: 1.1 10*3/uL — AB (ref 0.1–1.0)
MONOS PCT: 9 %
NEUTROS ABS: 10 10*3/uL — AB (ref 1.7–7.7)
Neutrophils Relative %: 82 %
PLATELETS: 84 10*3/uL — AB (ref 150–400)
RBC: 4.71 MIL/uL (ref 4.22–5.81)
RDW: 17.2 % — AB (ref 11.5–15.5)
WBC: 12.2 10*3/uL — ABNORMAL HIGH (ref 4.0–10.5)

## 2016-03-06 LAB — BASIC METABOLIC PANEL
ANION GAP: 10 (ref 5–15)
ANION GAP: 9 (ref 5–15)
Anion gap: 10 (ref 5–15)
BUN: 36 mg/dL — ABNORMAL HIGH (ref 6–20)
BUN: 40 mg/dL — AB (ref 6–20)
BUN: 50 mg/dL — ABNORMAL HIGH (ref 6–20)
CALCIUM: 8.7 mg/dL — AB (ref 8.9–10.3)
CALCIUM: 8.8 mg/dL — AB (ref 8.9–10.3)
CO2: 20 mmol/L — AB (ref 22–32)
CO2: 19 mmol/L — ABNORMAL LOW (ref 22–32)
CO2: 19 mmol/L — AB (ref 22–32)
Calcium: 8.5 mg/dL — ABNORMAL LOW (ref 8.9–10.3)
Chloride: 107 mmol/L (ref 101–111)
Chloride: 107 mmol/L (ref 101–111)
Chloride: 109 mmol/L (ref 101–111)
Creatinine, Ser: 2.45 mg/dL — ABNORMAL HIGH (ref 0.61–1.24)
Creatinine, Ser: 2.51 mg/dL — ABNORMAL HIGH (ref 0.61–1.24)
Creatinine, Ser: 2.61 mg/dL — ABNORMAL HIGH (ref 0.61–1.24)
GFR calc Af Amer: 28 mL/min — ABNORMAL LOW (ref >60)
GFR calc non Af Amer: 25 mL/min — ABNORMAL LOW (ref >60)
GFR, EST AFRICAN AMERICAN: 26 mL/min — AB (ref >60)
GFR, EST AFRICAN AMERICAN: 28 mL/min — AB (ref >60)
GFR, EST NON AFRICAN AMERICAN: 24 mL/min — AB (ref >60)
GFR, EST NON AFRICAN AMERICAN: 23 mL/min — AB (ref >60)
GLUCOSE: 88 mg/dL (ref 65–99)
GLUCOSE: 125 mg/dL — AB (ref 65–99)
Glucose, Bld: 107 mg/dL — ABNORMAL HIGH (ref 65–99)
POTASSIUM: 5.2 mmol/L — AB (ref 3.5–5.1)
POTASSIUM: 5.4 mmol/L — AB (ref 3.5–5.1)
Potassium: 5.9 mmol/L — ABNORMAL HIGH (ref 3.5–5.1)
Sodium: 136 mmol/L (ref 135–145)
Sodium: 137 mmol/L (ref 135–145)
Sodium: 137 mmol/L (ref 135–145)

## 2016-03-06 LAB — BLOOD GAS, ARTERIAL
ACID-BASE DEFICIT: 6.7 mmol/L — AB (ref 0.0–2.0)
BICARBONATE: 19.8 mmol/L — AB (ref 20.0–28.0)
DRAWN BY: 437071
FIO2: 40
MECHVT: 530 mL
O2 Saturation: 98.7 %
PEEP: 5 cmH2O
PO2 ART: 143 mmHg — AB (ref 83.0–108.0)
Patient temperature: 98.6
RATE: 18 resp/min
pCO2 arterial: 50.8 mmHg — ABNORMAL HIGH (ref 32.0–48.0)
pH, Arterial: 7.215 — ABNORMAL LOW (ref 7.350–7.450)

## 2016-03-06 LAB — COMPREHENSIVE METABOLIC PANEL
ALBUMIN: 2.7 g/dL — AB (ref 3.5–5.0)
ALK PHOS: 61 U/L (ref 38–126)
ALT: 32 U/L (ref 17–63)
ANION GAP: 7 (ref 5–15)
AST: 48 U/L — ABNORMAL HIGH (ref 15–41)
BUN: 44 mg/dL — ABNORMAL HIGH (ref 6–20)
CHLORIDE: 112 mmol/L — AB (ref 101–111)
CO2: 20 mmol/L — AB (ref 22–32)
Calcium: 8.5 mg/dL — ABNORMAL LOW (ref 8.9–10.3)
Creatinine, Ser: 2.5 mg/dL — ABNORMAL HIGH (ref 0.61–1.24)
GFR calc non Af Amer: 24 mL/min — ABNORMAL LOW (ref >60)
GFR, EST AFRICAN AMERICAN: 28 mL/min — AB (ref >60)
GLUCOSE: 113 mg/dL — AB (ref 65–99)
Potassium: 5.1 mmol/L (ref 3.5–5.1)
SODIUM: 139 mmol/L (ref 135–145)
Total Bilirubin: 1.4 mg/dL — ABNORMAL HIGH (ref 0.3–1.2)
Total Protein: 6.1 g/dL — ABNORMAL LOW (ref 6.5–8.1)

## 2016-03-06 LAB — AMMONIA: Ammonia: 34 umol/L (ref 9–35)

## 2016-03-06 LAB — PHOSPHORUS
PHOSPHORUS: 5.4 mg/dL — AB (ref 2.5–4.6)
Phosphorus: 4.7 mg/dL — ABNORMAL HIGH (ref 2.5–4.6)

## 2016-03-06 LAB — GLUCOSE, CAPILLARY
GLUCOSE-CAPILLARY: 122 mg/dL — AB (ref 65–99)
GLUCOSE-CAPILLARY: 94 mg/dL (ref 65–99)
Glucose-Capillary: 112 mg/dL — ABNORMAL HIGH (ref 65–99)

## 2016-03-06 LAB — MAGNESIUM
MAGNESIUM: 2.1 mg/dL (ref 1.7–2.4)
Magnesium: 2.2 mg/dL (ref 1.7–2.4)

## 2016-03-06 MED ORDER — SODIUM POLYSTYRENE SULFONATE 15 GM/60ML PO SUSP
45.0000 g | Freq: Once | ORAL | Status: AC
  Administered 2016-03-06: 45 g via ORAL
  Filled 2016-03-06: qty 180

## 2016-03-06 MED ORDER — CARVEDILOL 3.125 MG PO TABS
3.1250 mg | ORAL_TABLET | Freq: Two times a day (BID) | ORAL | Status: DC
  Administered 2016-03-06: 3.125 mg via ORAL
  Filled 2016-03-06: qty 1

## 2016-03-06 MED ORDER — PROPOFOL 1000 MG/100ML IV EMUL
5.0000 ug/kg/min | INTRAVENOUS | Status: DC
  Administered 2016-03-06: 20 ug/kg/min via INTRAVENOUS
  Filled 2016-03-06: qty 100

## 2016-03-06 MED ORDER — SODIUM POLYSTYRENE SULFONATE 15 GM/60ML PO SUSP
30.0000 g | Freq: Once | ORAL | Status: AC
  Administered 2016-03-06: 30 g
  Filled 2016-03-06: qty 120

## 2016-03-06 MED ORDER — POLYETHYLENE GLYCOL 3350 17 G PO PACK
17.0000 g | PACK | Freq: Every day | ORAL | Status: DC
  Administered 2016-03-06: 17 g via ORAL
  Filled 2016-03-06: qty 1

## 2016-03-06 MED ORDER — FUROSEMIDE 10 MG/ML IJ SOLN
40.0000 mg | Freq: Two times a day (BID) | INTRAMUSCULAR | Status: DC
  Administered 2016-03-06: 40 mg via INTRAVENOUS
  Filled 2016-03-06: qty 4

## 2016-03-06 MED ORDER — FENTANYL CITRATE (PF) 100 MCG/2ML IJ SOLN
25.0000 ug | INTRAMUSCULAR | Status: DC | PRN
  Administered 2016-03-06: 50 ug via INTRAVENOUS
  Filled 2016-03-06: qty 2

## 2016-03-06 MED ORDER — SODIUM POLYSTYRENE SULFONATE 15 GM/60ML PO SUSP
15.0000 g | Freq: Once | ORAL | Status: AC
  Administered 2016-03-06: 15 g
  Filled 2016-03-06: qty 60

## 2016-03-07 ENCOUNTER — Other Ambulatory Visit: Payer: Medicare Other

## 2016-03-07 ENCOUNTER — Other Ambulatory Visit (HOSPITAL_COMMUNITY): Payer: Medicare Other

## 2016-03-08 ENCOUNTER — Telehealth: Payer: Self-pay

## 2016-03-08 NOTE — Telephone Encounter (Signed)
On 23-Mar-2016 I received a death certificate from [facility] (original). The death certificate is for burial. The patient is a patient of Doctor Molli Knock. The death certificate will be taken to [facility]) this am for signature.  On March 23, 2016 I received the death certificate back from Doctor Molli Knock. I got the death certificate ready and called the funeral home to let them know the death certificate is ready for pickup.

## 2016-03-15 ENCOUNTER — Encounter (HOSPITAL_COMMUNITY): Payer: Medicare Other

## 2016-03-28 NOTE — Progress Notes (Signed)
RN called CT x2 to follow up on CT with angio for pt. CT busy and was told they would call when ready.  Will continue to monitor pt closely and follow up with CT.

## 2016-03-28 NOTE — Progress Notes (Signed)
Pt placed on comfort care and RN called [facility]. Pt pronounced dead by Ezequiel KayserPaige Beck, RN and Luisa HartPatrick, RN at [phone]... Listened for breath sounds and felt for pulses for one minute. Dr. Delton CoombesByrum notified.

## 2016-03-28 NOTE — Progress Notes (Signed)
eLink Physician-Brief Progress Note Patient Name: Ethan Vargas DOB: May 31, 1940 MRN: [id number]   Date of Service  04-24-2016  HPI/Events of Note  Additional kayexalate given based on most recent K+ 5.2  eICU Interventions       Intervention Category Intermediate Interventions: Electrolyte abnormality - evaluation and management  BYRUM,ROBERT S. 04-24-2016, 3:21 PM

## 2016-03-28 NOTE — Progress Notes (Signed)
eLink Physician-Brief Progress Note Patient Name: Ethan Vargas DOB: 1940-06-21 MRN: [id number]   Date of Service  03/16/2016  HPI/Events of Note  Multiple issues: 1. K+ = 5.9. EKG with old BBB and 2. Ventilator dyssynchrony - ABG on 40%/PRVC 18/TV 530/P 5 = 7.21/50.8/143/19.8  eICU Interventions  Will order: 1. Kayexalate 30 gm via tube now. 2. Repeat BMP at 12 noon.  3. Fentanyl 25-50 mcg Q 2 hours PRN.         Gervis Gaba Eugene 03/21/2016, 4:30 AM

## 2016-03-28 NOTE — Progress Notes (Signed)
PULMONARY / CRITICAL CARE MEDICINE   Name: Ethan Vargas MRN: [id number] DOB: 1940-10-05    ADMISSION DATE:  03/04/2016  REFERRING MD:  EDP   CHIEF COMPLAINT:  Post arrest   BRIEF 75yo male with hx AFib, CAD, CKD III, DM, PVD, OSA, chronic sCHF, ischemic cardiomyopathy with EF 35-40%, presented 10/7 after being found [facility] at [facility].  Had probable 10-20 mins downtime prior to CPR.  Fire arrived and AED advised shock x1, was asystole on EMS arrival with 8-4010mins CPR prior to ROSC so total 20-6730mins downtime.  Intubated in field by EMS.  Initially had some ST elevation on EKG and code STEMI was called, but on cardiology evaluation there was no further ST elevation, pt was hypotensive and decision was made not to take to cath lab based on prolonged downtime and overall instability. No purposeful movement noted post ROSC.  PCCM consulted for admit and hypothermia protocol.    SUBJECTIVE:  Agitated this AM, opens eyes but not tracking or following commands  VITAL SIGNS: BP 114/84 Comment: Simultaneous filing. User may not have seen previous data.  Pulse 74 Comment: Simultaneous filing. User may not have seen previous data.  Temp 98.4 F (36.9 C)   Resp 13 Comment: Simultaneous filing. User may not have seen previous data.  Ht 5\' 7"  (1.702 m)   Wt 73 kg (160 lb 15 oz)   SpO2 100%   BMI 25.21 kg/m   HEMODYNAMICS: CVP:  [13 mmHg-18 mmHg] 16 mmHg  VENTILATOR SETTINGS: Vent Mode: PRVC FiO2 (%):  [40 %-50 %] 40 % Set Rate:  [18 bmp] 18 bmp Vt Set:  [530 mL] 530 mL PEEP:  [5 cmH20] 5 cmH20 Plateau Pressure:  [17 cmH20-34 cmH20] 18 cmH20  INTAKE / OUTPUT: I/O last 3 completed shifts: In: 2819.5 [I.V.:1903.5; NG/GT:766; IV Piggyback:150] Out: 820 [Urine:690; Emesis/NG output:130]  PHYSICAL EXAMINATION: General:  wdwn older male Neuro:  Opens eyes but not following commands or tracking, moves all ext to command HEENT:  Mm moist, no JVD, ETT in place. Cardiovascular:  HR in the 50's,  regular, Nl S1/S2, -M/R/G. Lungs:  Resp even non labored on vent, diminished L. Abdomen:  Round, soft, +bs  Musculoskeletal:  Warm and dry, no edema   LABS: PULMONARY  Recent Labs Lab 2015/12/16 1541 2015/12/16 1607 2015/12/16 1747 2015/12/16 2003 03/04/16 0035 03/04/16 0435 03/08/2016 0305  PHART  --  7.327*  --   --   --  7.502* 7.215*  PCO2ART  --  49.5*  --   --   --  24.6* 50.8*  PO2ART  --  279.0*  --   --   --  212* 143*  HCO3  --  25.9  --   --   --  20.3 19.8*  TCO2 26 27 26 24 25   --   --   O2SAT  --  100.0  --   --   --  99.7 98.7   CBC  Recent Labs Lab 03/04/16 0423 03/05/16 0404 02/28/2016 0349  HGB 14.1 15.3 14.3  HCT 44.3 47.7 45.2  WBC 7.2 10.4 12.2*  PLT 73* 115* 84*    COAGULATION  Recent Labs Lab 2015/12/16 1442 03/04/16 0000  INR 1.17 1.28   CARDIAC    Recent Labs Lab 2015/12/16 1442 03/04/16 0054 03/04/16 0423 03/04/16 1313 03/05/16 0404  TROPONINI 0.12* 3.85* 4.75* 4.96* 4.32*   No results for input(s): PROBNP in the last 168 hours.  CHEMISTRY  Recent Labs Lab 2015/12/16 2212  03/04/16 0423  03/05/16 0404 03/05/16 1204 03/05/16 1604 03/05/16 1700 03/05/16 1946 03/30/16 0004 March 30, 2016 0349  NA  --   < > 140  < > 136 138 137  --  136 137 136  K  --   < > 3.6  < > 4.8 5.3* 4.9  --  5.2* 5.4* 5.9*  CL  --   < > 111  < > 108 108 108  --  106 107 107  CO2  --   --  19*  < > 19* 21* 21*  --  20* 20* 19*  GLUCOSE  --   < > 92  < > 161* 130* 65  --  123* 88 125*  BUN  --   < > 32*  < > 32* 33* 34*  --  34* 36* 40*  CREATININE  --   < > 1.94*  < > 1.96* 2.30* 2.40*  --  2.44* 2.45* 2.51*  CALCIUM  --   --  8.5*  < > 8.6* 8.9 8.8*  --  8.7* 8.5* 8.7*  MG 2.0  --  2.0  --  1.8  --   --  2.3  --   --  2.2  PHOS  --   --  <1.0*  --  3.0  --   --  4.8*  --   --  5.4*  < > = values in this interval not displayed. Estimated Creatinine Clearance: 24.5 mL/min (by C-G formula based on SCr of 2.51 mg/dL (H)).  LIVER  Recent Labs Lab 03/15/2016 1442  03/04/16 0000  AST 71*  --   ALT 47  --   ALKPHOS 66  --   BILITOT 0.8  --   PROT 6.8  --   ALBUMIN 3.6  --   INR 1.17 1.28   INFECTIOUS  Recent Labs Lab 03/14/2016 1541 03/16/2016 2212 03/05/16 0000  LATICACIDVEN 5.90* 1.3 1.4   ENDOCRINE CBG (last 3)   Recent Labs  03/05/16 2329 03-30-16 0351 2016-03-30 0727  GLUCAP 79 122* 112*   IMAGING x48h  - image(s) personally visualized  -   highlighted in bold Dg Chest Port 1 View  Result Date: 03/30/2016 CLINICAL DATA:  75 year old male with diabetes, hypertension and congestive heart failure. Subsequent encounter. EXAM: PORTABLE CHEST 1 VIEW COMPARISON:  03/05/2006. FINDINGS: Endotracheal tube tip 2.2 cm above the carina. Nasogastric tube tip gastric body level. Left central line tip proximal superior vena cava level directed laterally. This may be impressing upon the wall of the superior vena cava. Appearance unchanged. Pulmonary vascular congestion/ mild pulmonary edema. Slight improved aeration right lung base. Cardiomegaly. Calcified tortuous aorta. IMPRESSION: Slight improved aeration right lung base. Pulmonary vascular congestion/ mild pulmonary edema. Cardiomegaly. Left central line tip directed laterally possibly impress upon the lateral wall of the superior vena cava. Appearance unchanged. Aortic atherosclerosis. Electronically Signed   By: Lacy Duverney M.D.   On: March 30, 2016 07:39   Dg Chest Port 1 View  Result Date: 03/05/2016 CLINICAL DATA:  Endotracheal tube placement, shortness of breath EXAM: PORTABLE CHEST 1 VIEW COMPARISON:  03/24/2016 FINDINGS: There is an endotracheal tube with the tip 3.8 cm above the carina. There is a left jugular central venous catheter with the tip projecting over the SVC. There is a nasogastric tube coursing below the diaphragm. There is a trace right pleural effusion. There is right lower lobe airspace disease concerning for pneumonia. There is no pneumothorax. There is stable cardiomegaly. There  is evidence  of prior CABG. The osseous structures are unremarkable. IMPRESSION: 1. Endotracheal tube with the tip 3.8 cm above the carina. 2. Right lower lobe airspace disease concerning for pneumonia. Electronically Signed   By: Elige Ko   On: 03/05/2016 07:09   Echo 10/7>>>  CULTURES: BC x 2 10/7>>>   ANTIBIOTICS:   SIGNIFICANT EVENTS:   LINES/TUBES: ETT 10/7>>>   DISCUSSION: 75yo male with hx CAD, sCHF, ischemic cardiomyopathy admitted 10/7 after cardiac arrest with total downtime 20-30 minutes including at least 10 mins prior to CPR.   ASSESSMENT / PLAN:  PULMONARY Acute respiratory failure - post cardiac arrest  OSA on CPAP  Pulmonary HTN  P:   Full vent support - 8cc/kg  F/u CXR  F/u ABG Titrate O2 for sat of 88-92%. Hold weaning til neuro assess.  CARDIOVASCULAR Cardiac arrest - downtime 20-30 mins. Initial troponin 0.11 Ischemic cardiomyopathy - previous EF 35-40% CAD - remote CABG 1998 sCHF  AFib  Sudden episode of hypotension this AM, CVP 23 and HR of 50  P:  Cards seeing - no plans for cath  Hypothermia protocol complete Anticoagulation per cards  Hold home imdur, lasix, metolazone Restart coreg D/C pressors  RENAL CKD - baseline Scr ~2.0; at risk for an acute renal injury given down time Hypokalemia  Acute lactic acidosis P:   F/u chem and UOP. Replace electrolytes as indicated. BMET in AM. KVO IVF Kayexalate 45 g PO x1.  GASTROINTESTINAL Nutrition SUP P:   Pepcid bid. NPO. TF per nutrition Miralax with kayexalate  HEMATOLOGIC  P:  F/u CBC  SQ heparin; as above defer decision for full dose heparin to cardiology  INFECTIOUS No evidence acute infection P:   Monitor wbc  Follow clinically off abx.   ENDOCRINE DM   P:   SSI   NEUROLOGIC AMS - post cardiac arrest.  ??small area of hemorrhage in sinuses, no intraparenchymal bleeding P:   RASS goal: -2 that we are rewarming Neurology consult called Propofol  drip PRN fentanyl  FAMILY  - Updates: no family at bedside 03-15-16, RN to schedule a family meeting in AM  - Inter-disciplinary family meet or Palliative Care meeting due by: 03/10/16  The patient is critically ill with multiple organ systems failure and requires high complexity decision making for assessment and support, frequent evaluation and titration of therapies, application of advanced monitoring technologies and extensive interpretation of multiple databases.   Critical Care Time devoted to patient care services described in this note is  35  Minutes. This time reflects time of care of this signee Dr Koren Bound. This critical care time does not reflect procedure time, or teaching time or supervisory time of PA/NP/Med student/Med Resident etc but could involve care discussion time.  Alyson Reedy, M.D. [facility] Pulmonary/Critical Care Medicine. Pager: [phone]. After hours pager: [phone].  03-15-2016 9:00 AM

## 2016-03-28 NOTE — Progress Notes (Addendum)
 Patient Name: Ethan Vargas Date of Encounter: 03/08/2016  Primary Cardiologist: H. 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 Vasopressors converted to Dopamine yesterday due to bradycardia -- HR now in 80s. Acidotic early AM - asyncronous on Vent - now on Propofol. Apparently opens eyes, + gag, but not responding to commands.  Inpatient Medications    . chlorhexidine gluconate (MEDLINE KIT)  15 mL Mouth Rinse BID  . famotidine (PEPCID) IV  20 mg Intravenous Q12H  . heparin  5,000 Units Subcutaneous Q8H  . insulin aspart  2-6 Units Subcutaneous Q4H  . mouth rinse  15 mL Mouth Rinse 10 times per day  . polyethylene glycol  17 g Oral Daily    Vital Signs    Vitals:   03/05/2016 0800 03/02/2016 0900 03/25/2016 1000 03/04/2016 1025  BP: 130/81 (!) 155/88 105/64 (!) 105/50  Pulse: 71 83 73 77  Resp: 12 16 13   Temp: 98.6 F (37 C) 98.2 F (36.8 C) 97.9 F (36.6 C)   TempSrc: Core (Comment) Core (Comment) Core (Comment)   SpO2: 100% 100% 100%   Weight:      Height:        Intake/Output Summary (Last 24 hours) at 03/19/2016 1142 Last data filed at 03/05/2016 1100  Gross per 24 hour  Intake           1579.8 ml  Output             1350 ml  Net            229.8 ml   Filed Weights   03/25/2016 1446 03/02/2016 1754 03/20/2016 0400  Weight: 71.2 kg (157 lb) 72 kg (158 lb 12.1 oz) 73 kg (160 lb 15 oz)    Physical Exam   GEN: Intubated &* sedated.  Still unresponsive. HEENT: Grossly normal.  Neck: Supple,  no JVD, carotid bruits Cardiac: Distant sounds - Bradycardic with normal S1& S2. No M/R/G. No clubbing, cyanosis - ~2 + BLE edema.  Radials/DP/PT 2+ and equal bilaterally.  Respiratory:  Ventilator breath sounds - but no obvious rales - diminished in bilateral bases GI: Soft, nontender, nondistended, BS + x 4. Skin: cool, no rash Neuro: sedated  Labs    CBC  Recent Labs  03/05/16 0404 02/29/2016 0349  WBC 10.4 12.2*  NEUTROABS 8.8* 10.0*  HGB 15.3 14.3  HCT 47.7 45.2  MCV 94.1 96.0  PLT 115* 84*   Basic Metabolic Panel  Recent Labs  03/05/16 1700  03/26/2016 0349 03/05/2016 0950  NA  --   < > 136 139  K  --   < > 5.9* 5.1  CL  --   < > 107 112*  CO2  --   < > 19* 20*  GLUCOSE  --   < > 125* 113*  BUN  --   < > 40* 44*  CREATININE  --   < > 2.51* 2.50*  CALCIUM  --   < > 8.7* 8.5*  MG 2.3  --    2.2  --   PHOS 4.8*  --  5.4*  --   < > = values in this interval not displayed. Liver Function Tests  Recent Labs  03/05/2016 1442 03/17/2016 0950  AST 71* 48*  ALT 47 32  ALKPHOS 66 61  BILITOT 0.8 1.4*  PROT 6.8 6.1*  ALBUMIN 3.6 2.7*   No results for input(s): LIPASE, AMYLASE in the last 72 hours. Cardiac Enzymes  Recent Labs  03/04/16 0423 03/04/16 1313 03/05/16 0404  TROPONINI 4.75* 4.96* 4.32*   BNP Invalid input(s): POCBNP D-Dimer No results for input(s): DDIMER in the last 72 hours. Hemoglobin A1C No results for input(s): HGBA1C in the last 72 hours. Fasting Lipid Panel No results for input(s): CHOL, HDL, LDLCALC, TRIG, CHOLHDL, LDLDIRECT in the last 72 hours. Thyroid Function Tests No results for input(s): TSH, T4TOTAL, T3FREE, THYROIDAB in the last 72 hours.  Invalid input(s): FREET3  Telemetry    S Brady in 50s, intermittent Jxn escape rhythm in 30s.  ECG    n/a  Cardiology    2D Echo 10/9: Severe L&R Ventricular Dysfunction - EF 15-20% with diffuse HK. Mod AI, Mod TR & PR.  Severely elevated PA pressures ~53 mmHg (Pulm HTN).   Radiology      Dg Chest Port 1 View  Result Date: 03/10/2016 CLINICAL DATA:  75-year-old male with diabetes, hypertension and congestive heart failure. Subsequent encounter. EXAM: PORTABLE CHEST 1 VIEW COMPARISON:  03/05/2006. FINDINGS: Endotracheal tube tip 2.2 cm above the carina. Nasogastric tube tip gastric body level. Left central line tip proximal superior vena cava level directed laterally. This may be impressing upon the wall of the superior vena cava. Appearance unchanged. Pulmonary vascular congestion/ mild pulmonary edema. Slight improved aeration right lung base. Cardiomegaly. Calcified tortuous aorta. IMPRESSION: Slight improved aeration right lung base. Pulmonary vascular congestion/ mild pulmonary edema. Cardiomegaly. Left central line tip directed laterally possibly impress upon the lateral wall of the superior vena cava. Appearance unchanged. Aortic atherosclerosis. Electronically Signed   By: Steven  Olson M.D.   On: 03/05/2016 07:39   Dg Chest Port 1 View  Result Date: 03/05/2016 CLINICAL DATA:  Endotracheal tube placement, shortness of breath EXAM: PORTABLE CHEST 1 VIEW COMPARISON:  03/21/2016 FINDINGS: There is an endotracheal tube with the tip 3.8 cm above the carina. There is a left jugular central venous catheter with the tip projecting over the SVC. There is a nasogastric tube coursing below the diaphragm. There is a trace right pleural effusion. There is right lower lobe airspace disease concerning for pneumonia. There is no pneumothorax. There is stable cardiomegaly. There is evidence of prior CABG. The osseous structures are unremarkable. IMPRESSION: 1. Endotracheal tube with the tip 3.8 cm above the carina. 2. Right lower lobe airspace disease concerning for pneumonia. Electronically Signed   By: Hetal  Patel   On: 03/05/2016 07:09    Patient Profile     75 year old patient of Dr Smith, last seen on 02/23/2016 with h/o CAD, s/p CABG in 1998, chronic combined systolic and diastolic CHF  with LVEF 40-45% on echo in 2015, pulmonary hypertension, chronic LBBB. He has been deteriorating and Dr Smith was referring for an ICD/BiV evaluation in the EP clinic. He was admitted PM of 03/07/2016 after witnessed SCD, 10 minutes of no compressions, VT on monitor when EMS arrived, SR after 2 shocks, STEMI cancelled as old LBBB, now cooled down.  Now Echo demonstrates severe dilated cardiomyopathy - with severe Pulmonary HTN  Assessment & Plan      Principal Problem:   Cardiac arrest (HCC) /  Ventricular tachycardia (HCC) - prolonged "witnessed arrest", long down time.  On cooling protocol  Neuro following   Remains Pressor Dependent - on Dopamine for Cardiogenic (post-arrest) Shock. - would not restart BB until off Dopamine     Coronary artery disease involving native coronary artery of native heart with angina pectoris  (HCC) -- Elevated Troponin - given trend of Troponin levels, most /w demand ischemia.  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 (may need RHC to help with management if course is prolonged)  I suspect that a large portion of this is related to Post-Arrest dysfunction - flat Troponin elevation & newly dropped EF  Echo this AM shows severe biV dysfunction --  Does have edema & minimal UOP  May not be able to wean off of Dopamine for now   If he does show signs of meaningful recovery, may consider R&LHC to determine presence of worsening CAD     Acute respiratory failure with hypoxia (HCC) - remains intubated, managed by PCCM   Chronic kidney disease (CKD), stage III-IV (moderate) - stable creatinine (Net ++ with low UOP) - with notable edema, would start diuretic.   Awaiting Neurologic recovery - no invasive  management recommended unless neurologic recovery noted. Will follow.  Family brought Living Will papers in = considering DNR. Based upon my discussion with Dr. Smith (his Cardiologist) - the patient would not want to be on prolonged support.  Would consider formal DNR & no escalation of care based upon poor neurologic outcome.    Signed,  David Harding, M.D., M.S. Interventional Cardiologist   Pager # [phone] Phone # [phone] [address] [place], [place] [address]   03/20/2016, 11:42 AM     

## 2016-03-28 NOTE — Progress Notes (Signed)
240 cc of fentanyl wasted by myself and Ann MakiMegan Catlin Aycock RN  Witnessed by Ann MakiMegan Cacey Willow, RN

## 2016-03-28 NOTE — Consult Note (Signed)
NEURO HOSPITALIST CONSULT NOTE   Requestig physician: Dr. Lamonte Sakai   Reason for Consult: Prognostication   History obtained from:   Chart    HPI:                                                                                                                                         33 of history obtained from chart:  75yo male with hx AFib, CAD, CKD III, DM, PVD, OSA, chronic sCHF, ischemic cardiomyopathy with EF 35-40%, presented 10/7 after being found down at [facility].  Had probable 10-20 mins downtime prior to CPR.  Fire arrived and AED advised shock x1, was asystole on EMS arrival with 8-59mns CPR prior to ROSC so total 20-382ms downtime.  Intubated in field by EMS.  Initially had some ST elevation on EKG and code STEMI was called, but on cardiology evaluation there was no further ST elevation, pt was hypotensive and decision was made not to take to cath lab based on prolonged downtime and overall instability. No purposeful movement noted post ROSC. Patient was placed on hypothermic protocol. Patient was rewarmed on 03/05/2016 at 7:37 AM.  While hospitalized he was noted the patient had elevated creatinine, BUN, and recently has become hyperkalemic.  EEG was obtained on 03/05/2016 at 11:57 AM--this showed to be abnormal due to moderate diffuse slowing of the background however no epileptiform activity was noted there was also no reactivity to noxious stimuli upper ventilation and photic stimulation were not done.  Head CT was performed at time of arrival to ED. This showed age-related volume loss and mild periventricular small vessel disease however no acute hemorrhage or CVA.  Rewarmed 03/05/2016 at 7:37 AM   Last dose of Versed was at 7 AM on 03/05/2016 Last dose of fentanyl was 4:42 AM on 1010-29-17atient remains on norepinephrine drip  Had one episode of hypotension with systolic blood pressure 80 and diastolic blood pressure 62 on 03/05/2016 at 9 AM  Past  Medical History:  Diagnosis Date  . Anemia of chronic disease   . Arthritis   . Atrial fibrillation ([facility]  . Barrett's esophageal ulceration   . CAD (coronary artery disease)    a. Remote CABG 1998. b. Inf MI 2003 s/ DES to SVG-RCA. c. Additional stenting in 2010 - occluded SVG to RCA and DES to SVG-Diag#1. d. Nuc 07/2013 with Intermediate risk stress nuclear study.  There is a medium size defect of moderate severity affecting the apical anterior, mid anterior and mid anteroseptal segments. There is global hypokinesis.  EF 28%.  . Cellulitis Dec./Jan 2011 and 2012  . Chronic systolic CHF (congestive heart failure) ([facility]  . CKD (chronic kidney disease), stage III   . Diabetes mellitus    a. c/b neuropathy, nephropathy.  .Marland Kitchen  History of hiatal hernia    REFLUX  . Hyperlipidemia   . Hypertension   . Ischemic cardiomyopathy   . Myocardial infarction Feb. 2003   Heart Attack  . Peripheral vascular disease ([place])   . Pneumonia    TWICE IN LAST 5 YRS  . PONV (postoperative nausea and vomiting)   . Renal artery stenosis (HCC)    a. s/p stent 2007.  . Right-sided carotid artery disease ([place])   . Shortness of breath dyspnea    WITH EXERTION  . Sleep apnea     Past Surgical History:  Procedure Laterality Date  . ARTERIAL THROMBECTOMY  2003   X's 2 stents  . CAROTID ENDARTERECTOMY Right 02-25-15   CEA  . CORONARY ARTERY BYPASS GRAFT  Sept. 1998  . ENDARTERECTOMY Right 02/25/2015   Procedure: RIGHT CAROTID ARTERY ENDARTERECTOMY ;  Surgeon: Serafina Mitchell, MD;  Location: [place];  Service: Vascular;  Laterality: Right;  . KNEE SURGERY  1961   Right knee  . PATCH ANGIOPLASTY Right 02/25/2015   Procedure: WITH XENOSURE BOVINE PATCH ANGIOPLASTY;  Surgeon: Serafina Mitchell, MD;  Location: [facility] OR;  Service: Vascular;  Laterality: Right;  . Stents surgery  2007  and  2010   Kidney  ( 2007 ) and  Heart ( Sept. 1998 )  . URETHRAL DILATION     1984    Family History  Problem Relation Age of Onset   . Hypertension Mother   . Deep vein thrombosis Mother   . Heart disease Father     Heart Disease before age 17  . Heart attack Father   . Deep vein thrombosis Sister     Amputation  . Diabetes Brother   . Heart disease Brother     Anerysm stomach  . Hypertension Brother   . Deep vein thrombosis Brother     Amputation  . Heart disease Brother     Heart disease before age 43  . Hypertension Brother   . Stroke Neg Hx       Social History:  reports that he has never smoked. He has never used smokeless tobacco. He reports that he does not drink alcohol or use drugs.  Allergies  Allergen Reactions  . Contrast Media [Iodinated Diagnostic Agents] Swelling    Premedication with prednisone-  Eyes closed from swelling  . Iodine Swelling  . Mercury Swelling  . Clopidogrel Bisulfate Rash    Plavix  . Sulfonamide Derivatives Other (See Comments)    unknown    MEDICATIONS:                                                                                                                     Scheduled: . carvedilol  3.125 mg Oral BID WC  . chlorhexidine gluconate (MEDLINE KIT)  15 mL Mouth Rinse BID  . famotidine (PEPCID) IV  20 mg Intravenous Q12H  . heparin  5,000 Units Subcutaneous Q8H  . insulin aspart  2-6 Units Subcutaneous Q4H  .  mouth rinse  15 mL Mouth Rinse 10 times per day  . polyethylene glycol  17 g Oral Daily  . sodium polystyrene  45 g Oral Once     ROS:                                                                                                                                       History obtained from unobtainable from patient due to mental status     Blood pressure (!) 122/53, pulse 70, temperature 98.4 F (36.9 C), resp. rate 12, height '5\' 7"'  (1.702 m), weight 73 kg (160 lb 15 oz), SpO2 100 %.   Neurologic Examination:                                                                                                      HEENT-  Normocephalic, no lesions,  without obvious abnormality.  Normal external eye and conjunctiva.  Normal TM's bilaterally.  Normal auditory canals and external ears. Normal external nose, mucus membranes and septum.  Normal pharynx. Cardiovascular- S1, S2 normal, pulses palpable throughout   Lungs- chest clear, no wheezing, rales, normal symmetric air entry Abdomen- normal findings: bowel sounds normal Extremities- no edema Lymph-no adenopathy palpable Musculoskeletal-no joint tenderness, deformity or swelling Skin-warm and dry, no hyperpigmentation, vitiligo, or suspicious lesions  Neurological Examination Patient was given propofol just before physical examination secondary to agitation Mental Status: Patient does not respond to verbal stimuli.  Does mildly grinmices to deep sternal rub.  Does not follow commands.  No verbalizations noted.  Cranial Nerves: II: patient does not respond confrontation bilaterally, pupils right 1 mm, left 1 mm,and minimally reactive bilaterally III,IV,VI: doll's response absent bilaterally.  V,VII: corneal reflex present bilaterally  VIII: patient does not respond to verbal stimuli IX,X: gag reflex present, XI: trapezius strength unable to test bilaterally XII: tongue strength unable to test Motor: Extremities flaccid throughout.  No spontaneous movement noted.  No purposeful movements noted. Sensory: Does not respond to noxious stimuli in any extremity. Deep Tendon Reflexes:  Absent throughout. Plantars: absent bilaterally Cerebellar: Unable to perform        Lab Results: Basic Metabolic Panel:  Recent Labs Lab 03/22/2016 2212  03/04/16 0423  03/05/16 0404 03/05/16 1204 03/05/16 1604 03/05/16 1700 03/05/16 1946 03/23/2016 0004 Mar 23, 2016 0349  NA  --   < > 140  < > 136 138 137  --  136 137 136  K  --   < > 3.6  < >  4.8 5.3* 4.9  --  5.2* 5.4* 5.9*  CL  --   < > 111  < > 108 108 108  --  106 107 107  CO2  --   --  19*  < > 19* 21* 21*  --  20* 20* 19*  GLUCOSE  --    < > 92  < > 161* 130* 65  --  123* 88 125*  BUN  --   < > 32*  < > 32* 33* 34*  --  34* 36* 40*  CREATININE  --   < > 1.94*  < > 1.96* 2.30* 2.40*  --  2.44* 2.45* 2.51*  CALCIUM  --   --  8.5*  < > 8.6* 8.9 8.8*  --  8.7* 8.5* 8.7*  MG 2.0  --  2.0  --  1.8  --   --  2.3  --   --  2.2  PHOS  --   --  <1.0*  --  3.0  --   --  4.8*  --   --  5.4*  < > = values in this interval not displayed.  Liver Function Tests:  Recent Labs Lab 03/11/2016 1442  AST 71*  ALT 47  ALKPHOS 66  BILITOT 0.8  PROT 6.8  ALBUMIN 3.6   No results for input(s): LIPASE, AMYLASE in the last 168 hours. No results for input(s): AMMONIA in the last 168 hours.  CBC:  Recent Labs Lab 02/28/2016 1442  03/01/2016 1749 03/02/2016 2003 03/04/16 0035 03/04/16 0423 03/05/16 0404 March 19, 2016 0349  WBC 8.0  --  9.7  --   --  7.2 10.4 12.2*  NEUTROABS 3.2  --   --   --   --   --  8.8* 10.0*  HGB 14.2  < > 13.2 16.0 13.9 14.1 15.3 14.3  HCT 45.8  < > 42.1 47.0 41.0 44.3 47.7 45.2  MCV 98.5  --  96.6  --   --  94.5 94.1 96.0  PLT 92*  --  104*  --   --  73* 115* 84*  < > = values in this interval not displayed.  Cardiac Enzymes:  Recent Labs Lab 03/16/2016 1442 03/04/16 0054 03/04/16 0423 03/04/16 1313 03/05/16 0404  TROPONINI 0.12* 3.85* 4.75* 4.96* 4.32*    Lipid Panel: No results for input(s): CHOL, TRIG, HDL, CHOLHDL, VLDL, LDLCALC in the last 168 hours.  CBG:  Recent Labs Lab 03/05/16 1937 03/05/16 2011 03/05/16 2329 2016/03/19 0351 03-19-2016 0727  GLUCAP 71* 100* 14 122* 112*    Microbiology: Results for orders placed or performed during the hospital encounter of 03/11/2016  MRSA PCR Screening     Status: None   Collection Time: 03/11/2016  5:49 PM  Result Value Ref Range Status   MRSA by PCR NEGATIVE NEGATIVE Final    Comment:        The GeneXpert MRSA Assay (FDA approved for NASAL specimens only), is one component of a comprehensive MRSA colonization surveillance program. It is not intended  to diagnose MRSA infection nor to guide or monitor treatment for MRSA infections.     Coagulation Studies:  Recent Labs  02/28/2016 1442 03/04/16 0000  LABPROT 15.0 16.1*  INR 1.17 1.28    Imaging: Dg Chest Port 1 View  Result Date: 03/19/2016 CLINICAL DATA:  75 year old male with diabetes, hypertension and congestive heart failure. Subsequent encounter. EXAM: PORTABLE CHEST 1 VIEW COMPARISON:  03/05/2006. FINDINGS: Endotracheal tube tip 2.2 cm above  the carina. Nasogastric tube tip gastric body level. Left central line tip proximal superior vena cava level directed laterally. This may be impressing upon the wall of the superior vena cava. Appearance unchanged. Pulmonary vascular congestion/ mild pulmonary edema. Slight improved aeration right lung base. Cardiomegaly. Calcified tortuous aorta. IMPRESSION: Slight improved aeration right lung base. Pulmonary vascular congestion/ mild pulmonary edema. Cardiomegaly. Left central line tip directed laterally possibly impress upon the lateral wall of the superior vena cava. Appearance unchanged. Aortic atherosclerosis. Electronically Signed   By: Genia Del M.D.   On: Mar 17, 2016 07:39   Dg Chest Port 1 View  Result Date: 03/05/2016 CLINICAL DATA:  Endotracheal tube placement, shortness of breath EXAM: PORTABLE CHEST 1 VIEW COMPARISON:  03/21/2016 FINDINGS: There is an endotracheal tube with the tip 3.8 cm above the carina. There is a left jugular central venous catheter with the tip projecting over the SVC. There is a nasogastric tube coursing below the diaphragm. There is a trace right pleural effusion. There is right lower lobe airspace disease concerning for pneumonia. There is no pneumothorax. There is stable cardiomegaly. There is evidence of prior CABG. The osseous structures are unremarkable. IMPRESSION: 1. Endotracheal tube with the tip 3.8 cm above the carina. 2. Right lower lobe airspace disease concerning for pneumonia. Electronically  Signed   By: Kathreen Devoid   On: 03/05/2016 07:09       Assessment and plan per attending neurologist  Etta Quill PA-C [facility] [phone]  2016/03/17, 9:38 AM  I have examined the patient and reviewed th eabove note.   On my exam, he does have pupillary reflexes bilaterally, corneal reflexes bilaterally, doll's eye reflex, cough. He has minimal flexion versus withdrawal in all 4 extremities.  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 It is difficult to say with any degree of certainty what his prognosis is at this point. I would favor continuing to wait to see if he shows signs of improvement. CT head could be helpful if it shows diffuse hypoxic injury.  1) CT head 2) neurology will continue to follow  Roland Rack, MD [facility] [phone]  If 7pm- 7am, please page neurology on call as listed in [place].

## 2016-03-28 NOTE — Procedures (Signed)
Extubation Procedure Note  Patient Details:   Name: Ethan Vargas DOB: 01/19/1941 MRN: [id number]   Airway Documentation:     Evaluation  O2 sats: stable throughout Complications: No apparent complications Patient did tolerate procedure well. Bilateral Breath Sounds: Rhonchi   Yes   Pt. was terminally extubated to a 2L [place] without any complications.   Vuong Musa, Margaretmary Dysshley L Feb 10, 2016, 5:15 PM

## 2016-03-28 NOTE — Progress Notes (Signed)
eLink Physician-Brief Progress Note Patient Name: Ethan Vargas DOB: [birth year] MRN: [id number]   Date of Service  10-16-2015  HPI/Events of Note  Spoke with the patient's son and wife by phone regarding goals for care. They would like to withdraw care based on his neurological injuries and the low likelihood for a neurological recovery. I agree with this plan based on my review of the chart. I have also spoken with Dr Molli Knockyacoub this pm. I will place withdrawal of care orders  eICU Interventions       Intervention Category Major Interventions: End of life / care limitation discussion  [name] S. 10-16-2015, 4:31 PM

## 2016-03-28 DEATH — deceased

## 2016-04-13 ENCOUNTER — Ambulatory Visit: Payer: Medicare Other | Admitting: Interventional Cardiology

## 2016-04-27 NOTE — Discharge Summary (Signed)
NAME:  Floyde ParkinsWINSTEAD, Burl             ACCOUNT NO.:  [id number]  MEDICAL RECORD NO.:  [id number]  LOCATION:  2H09C                        FACILITY:  [facility]  PHYSICIAN:  Felipa EvenerWesam Jake Yacoub, MD  DATE OF BIRTH:  Dec 29, 1940  DATE OF ADMISSION:  May 26, 2016 DATE OF DISCHARGE:  03/04/2016                              DISCHARGE SUMMARY   DEATH SUMMARY  PRIMARY DIAGNOSIS/CAUSE OF DEATH:  Ventricular fibrillation/ventricular tachycardic arrest.  SECONDARY DIAGNOSES:  Atrial fibrillation, coronary artery disease, chronic kidney disease, diabetes mellitus, peripheral vascular disease, obstructive sleep apnea, congestive heart failure, acute respiratory failure, acute anoxic encephalopathy, pulmonary hypertension.  HOSPITAL COURSE:  The patient was a 75 year old year old male with an extensive past medical history, including diabetes, hypertension, peripheral vascular disease, who presented to the hospital after being found unresponsive.  EMS was called.  AED was connected to the patient, shock was __________ x1.  However, since procedure spontaneous circulation took approximately total 20 to 30 minutes of down time.  The patient was __________ department.  __________ was called to admit the patient, and admitted in the intensive care unit.  The hypothermic protocol was started and completed.  However, it was evident that the patient had significant anoxic injury, RN was asked to bring the patient's wife and already discussed the plans of care.  However, at 4:30 p.m., the family called and spoke with Dr. Levy Pupaobert Byrum, who documented the following.  Spoke with the patient's son and wife by phone regarding goals of care.  They would like to withdraw care based on his neurologic injuries and though, likely __________ based on my review of the chart, I also spoke with Dr. Molli KnockYacoub, this p.m. and will place "withdrawal orders."  The patient was started on morphine and expired shortly thereafter.   __________     Felipa EvenerWesam Jake Yacoub, MD     WJY/MEDQ  D:  04/03/2016  T:  04/04/2016  Job:  [id number]

## 2017-02-27 NOTE — Progress Notes (Signed)
PATIENT IS DECEASED 

## 2017-09-04 IMAGING — CT CT HEAD W/O CM
3 of 4 series · 16 of 47 positions shown, 19 images · non-contrast
Comparison: None.

CLINICAL DATA: Cardiac arrest with fall

EXAM:
CT HEAD WITHOUT CONTRAST
TECHNIQUE: Contiguous axial images were obtained from the base of the skull
through the vertex without intravenous contrast.

[Series 201: head w/o, idose (1) · axial · non-contrast · 0.49mm/px · z∈[+115,+245]mm · 10 of 32 slices shown, 13 images]
[im 3/32  brain]
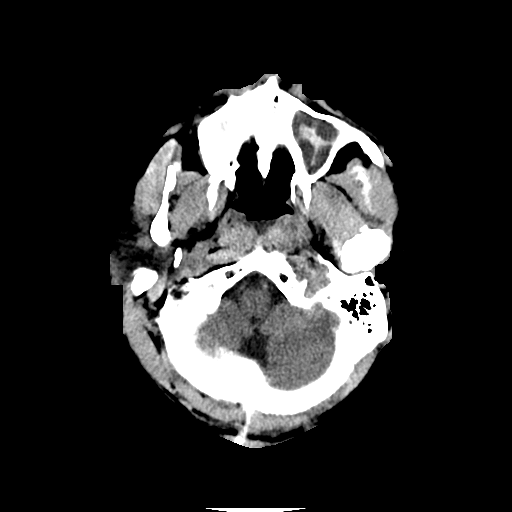
[im 3/32  bone]
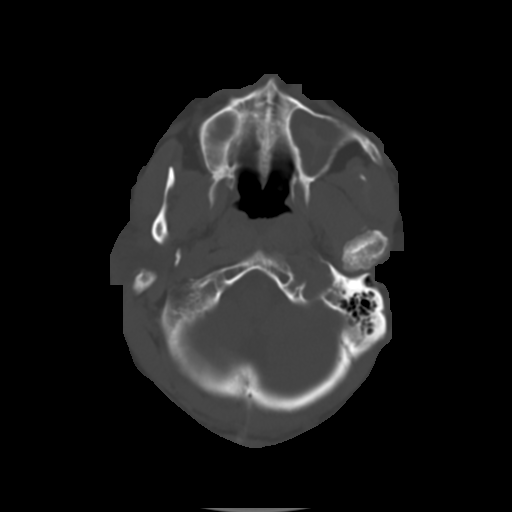
[im 5/32  brain]
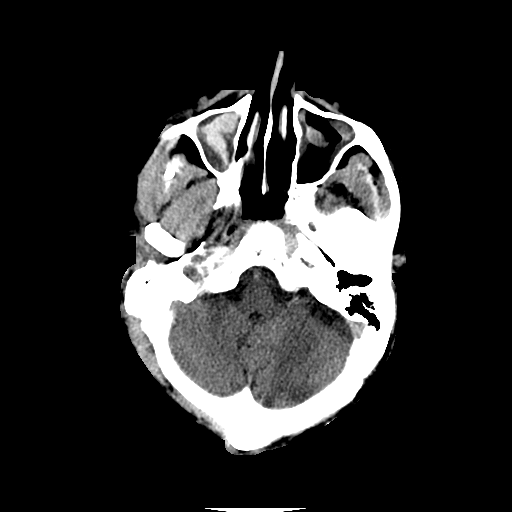
[im 9/32  brain]
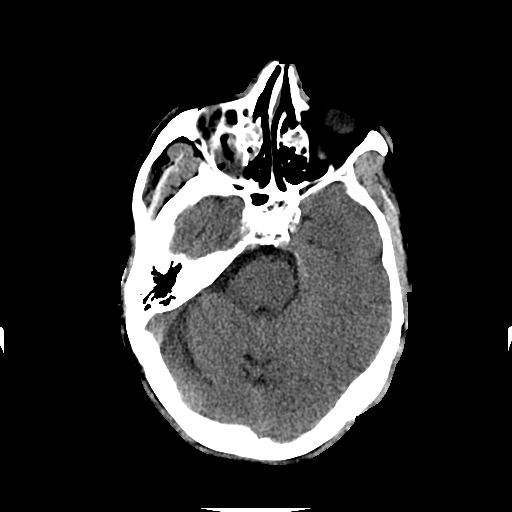
[im 12/32  brain]
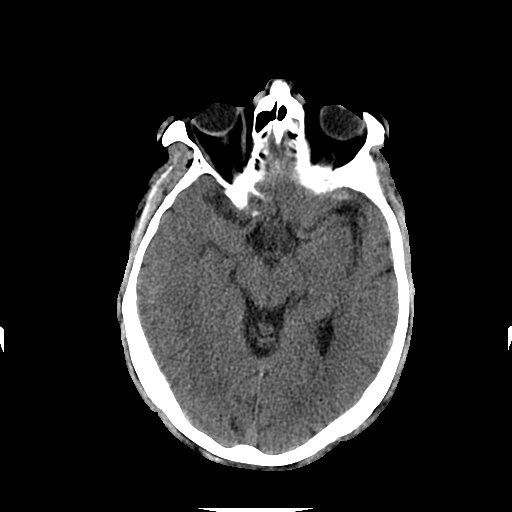
[im 14/32  brain]
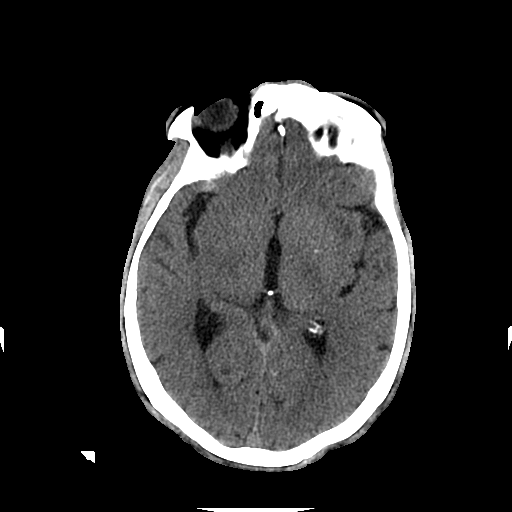
[im 14/32  bone]
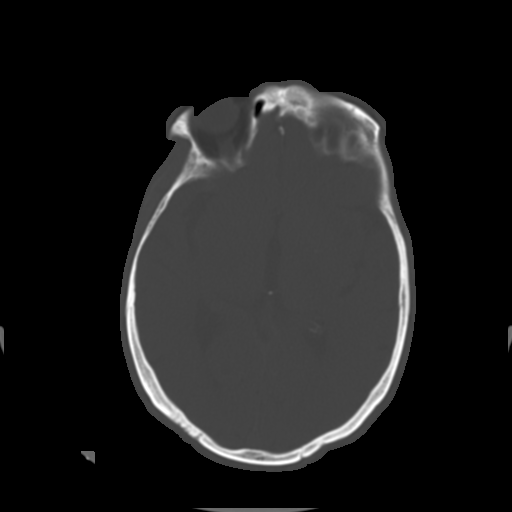
[im 18/32  brain]
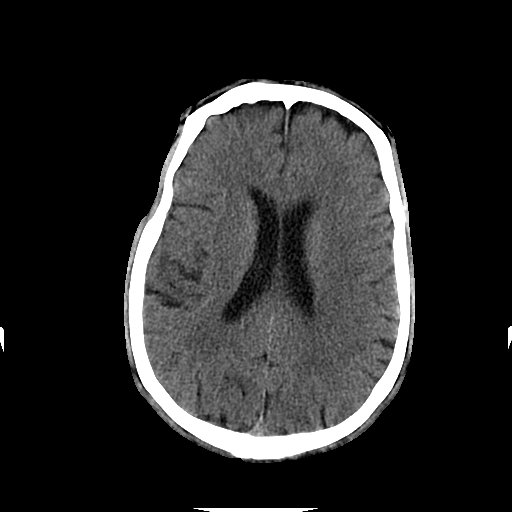
[im 20/32  brain]
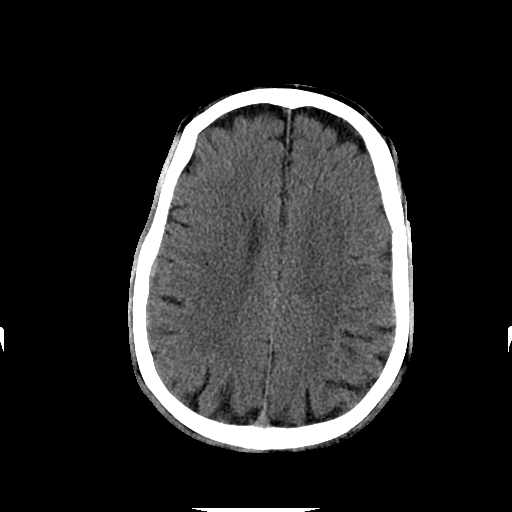
[im 23/32  brain]
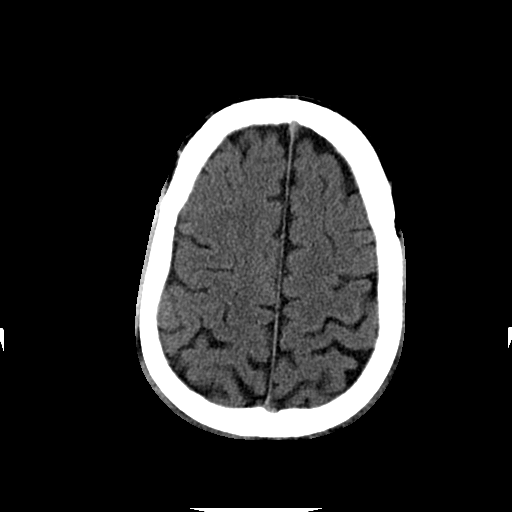
[im 27/32  brain]
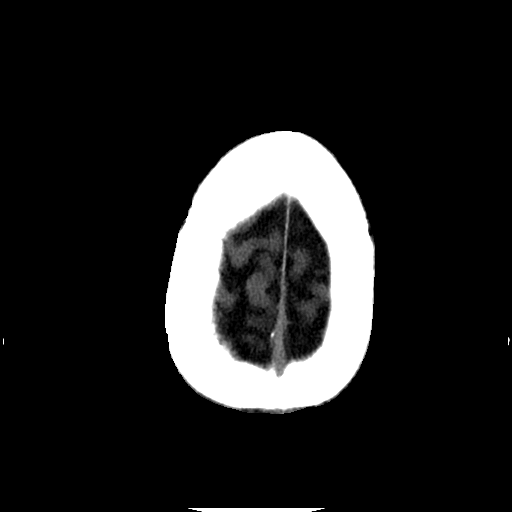
[im 27/32  bone]
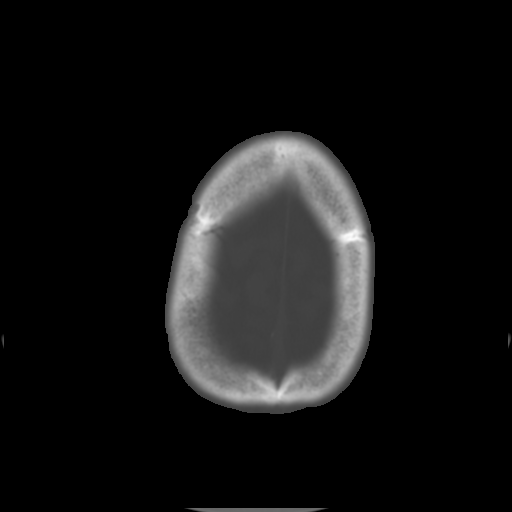
[im 29/32  brain]
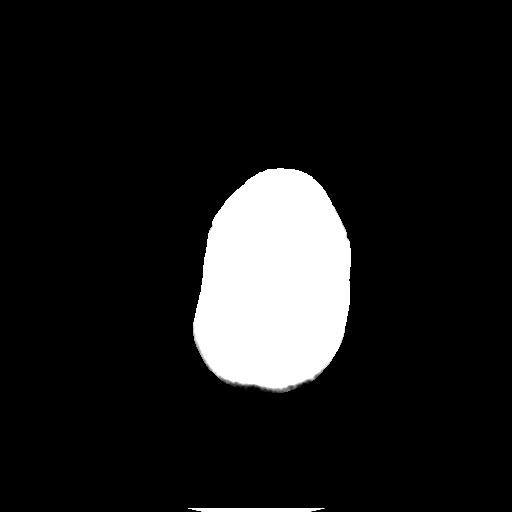

[Series 203: coronal st, idose (1) · coronal · 0.40mm/px · 3 of 79 slices shown]
[im 27/79  brain]
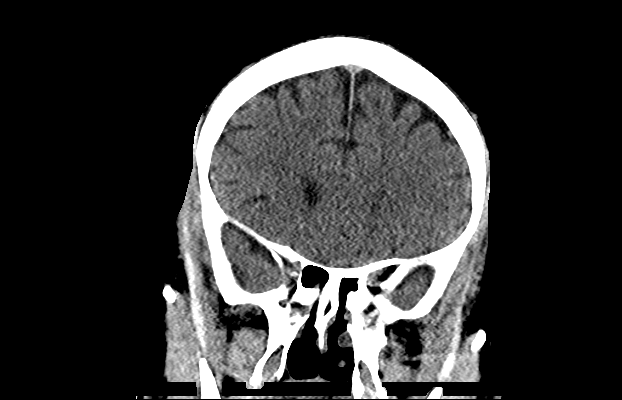
[im 35/79  brain]
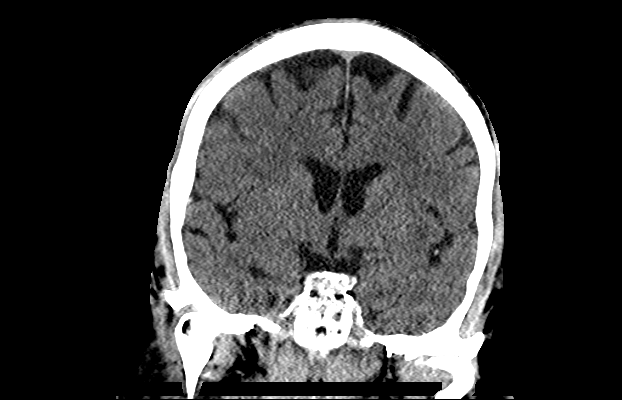
[im 44/79  brain]
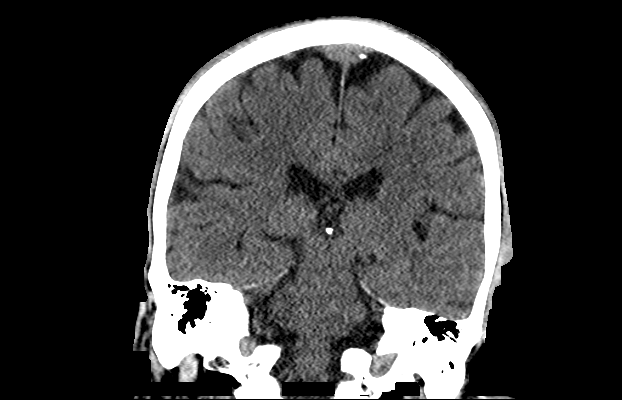

[Series 204: sagittal st, idose (1) · sagittal · 0.40mm/px · 3 of 83 slices shown]
[im 28/83  brain]
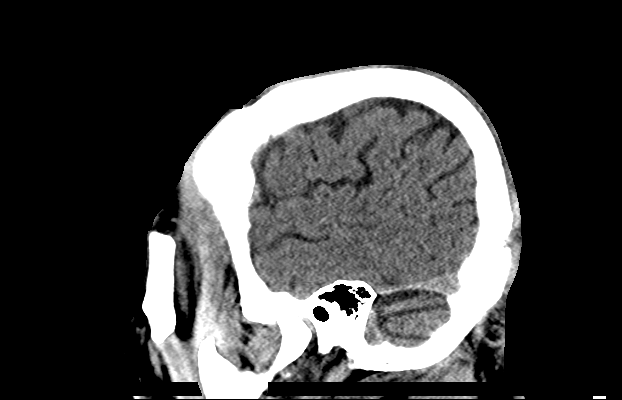
[im 42/83  brain]
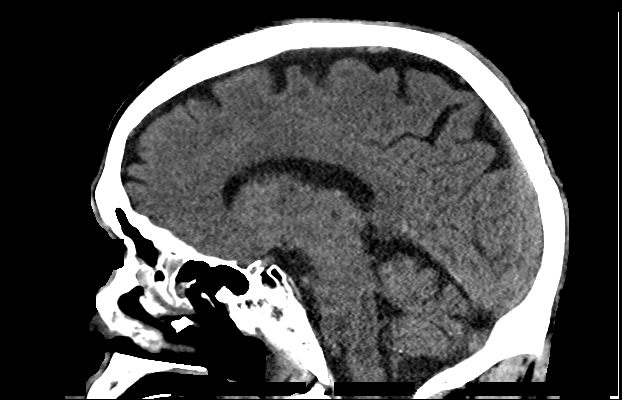
[im 55/83  brain]
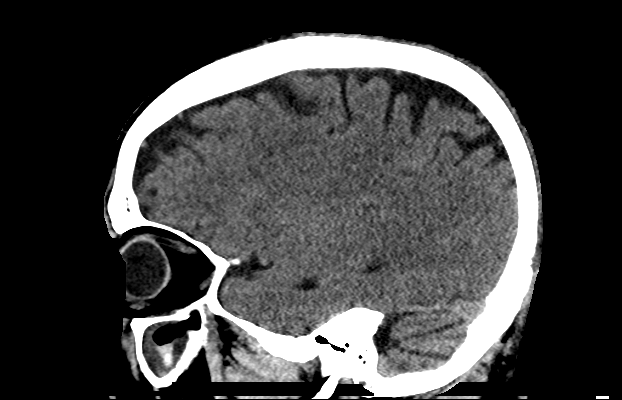

[16 of 47 positions shown; findings below may reference images not displayed]

FINDINGS: Brain: There is age related volume loss. There is no intracranial
mass, hemorrhage, extra-axial fluid collection, or midline shift.
There is slight small vessel disease in the centra semiovale
bilaterally. Elsewhere gray-white compartments appear normal. No
acute infarct evident.

Vascular: There is no hyperdense vessel evident. There is
calcification in each carotid siphon region.

Skull: The bony calvarium appears intact.

Sinuses/Orbits: Patient is intubated. There is opacification of
multiple ethmoid air cells. There is extensive opacification in both
maxillary antra. There is increased attenuation throughout much of
the opacity in the maxillary antra. Note that frontal sinuses are
hypoplastic. Orbits appear symmetric bilaterally.

Other: Mastoid air cells are clear.
IMPRESSION: Age related volume loss with mild periventricular small vessel
disease. No intracranial mass, hemorrhage, or extra-axial fluid
collection. No acute infarct evident.

There is arterial vascular calcification in the carotid siphon
regions bilaterally.

Paranasal sinus disease at multiple sites. Increased attenuation in
the maxillary antra raises question of hemorrhage within the sinuses
or possible subtle calcification. Subtle calcification can be seen
with fungal colonization in the sinuses consistent with chronic
sinus disease.

## 2017-09-04 IMAGING — CR DG CHEST 1V PORT
1 series · 1 of 1 positions shown · non-contrast
Comparison: 03/03/2016, 03/22/2014

CLINICAL DATA: 75-year-old male with a history of central line
placement

EXAM:
PORTABLE CHEST 1 VIEW

[AP]
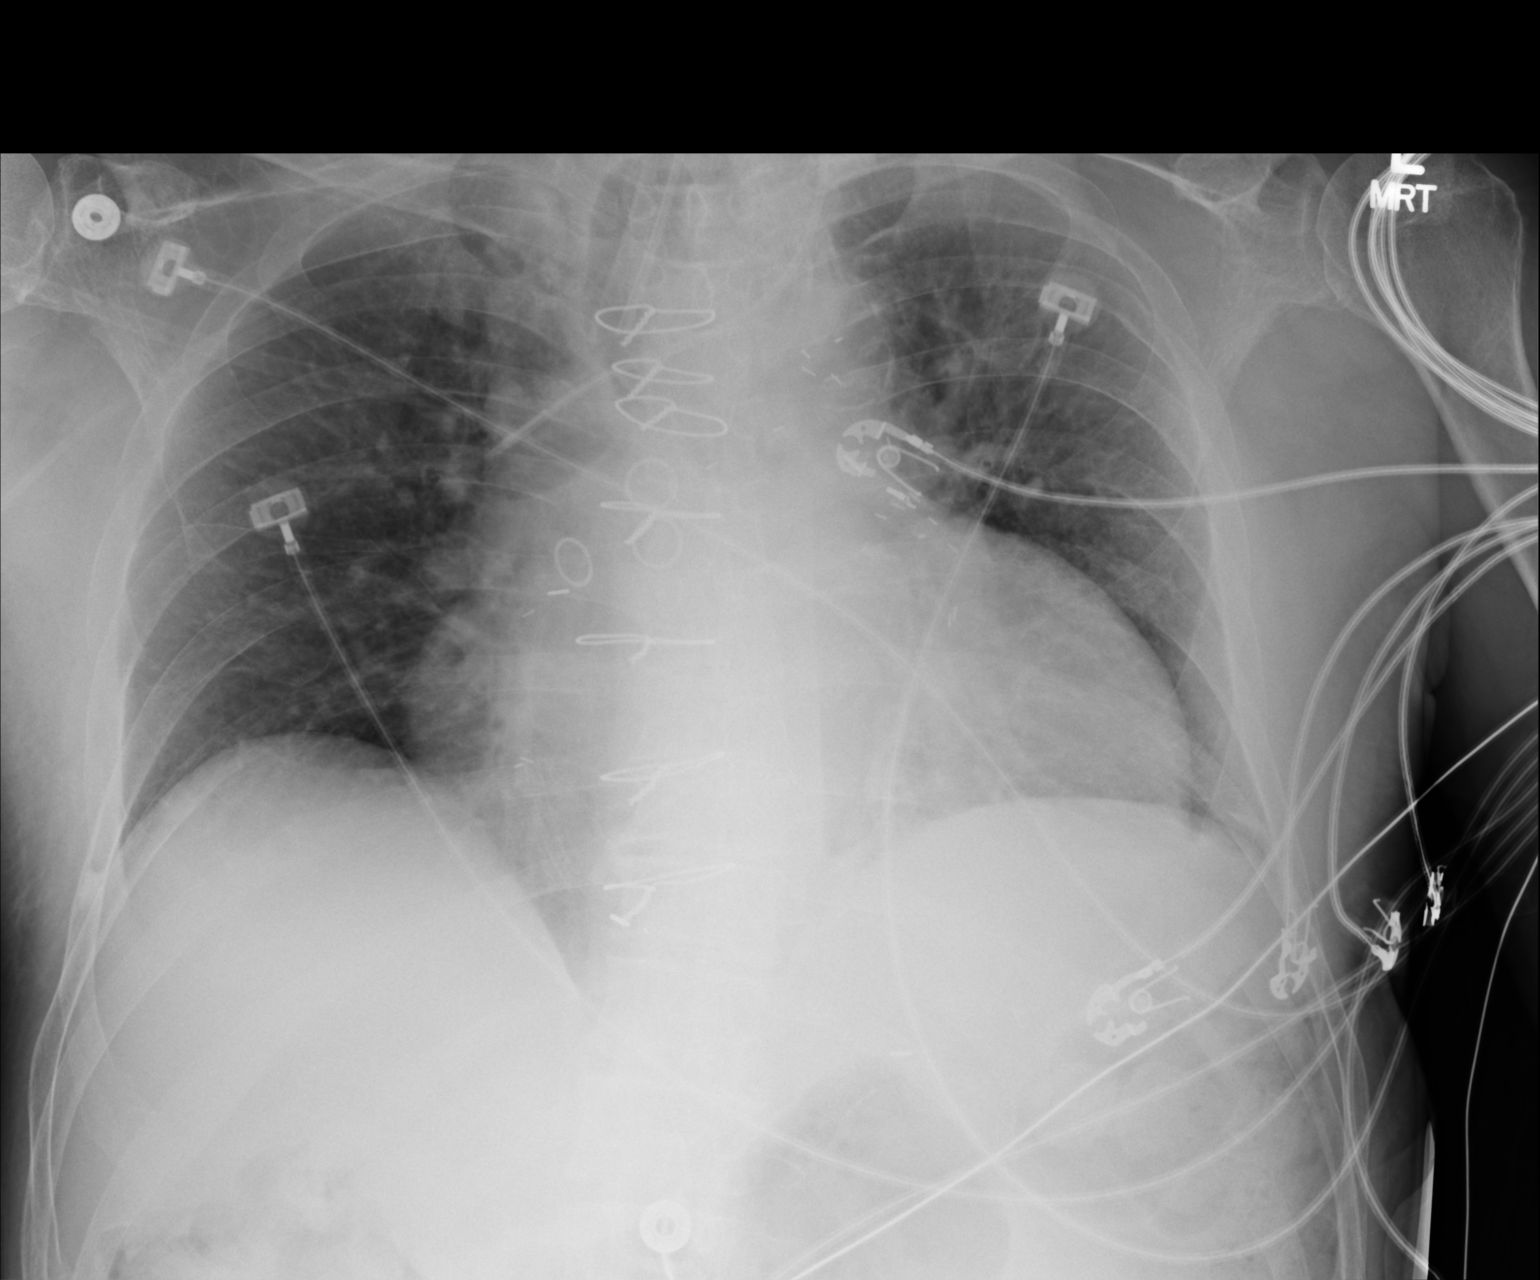

[1 of 1 positions shown; findings below may reference images not displayed]

FINDINGS: Cardiomediastinal silhouette unchanged. Surgical changes of median
sternotomy, CABG.

Endotracheal tube unchanged, terminating approximately 2.2 cm above
the carina.

Interval placement of left IJ approach central venous catheter which
terminates superior vena cava.

Low lung volumes with linear opacities bilaterally.

No pneumothorax. No confluent airspace disease. No large pleural
effusion.

No displaced fracture.
IMPRESSION: Interval placement of left IJ central venous catheter, with the tip
appearing to terminate superior vena cava. No pneumothorax.

Unchanged endotracheal tube which terminates approximately 2.2 cm
above the carina.

Surgical changes of prior median sternotomy and CABG.

## 2017-09-06 IMAGING — CR DG CHEST 1V PORT
1 series · 1 of 1 positions shown · non-contrast
Comparison: 03/03/2016

CLINICAL DATA: Endotracheal tube placement, shortness of breath

EXAM:
PORTABLE CHEST 1 VIEW

[AP]
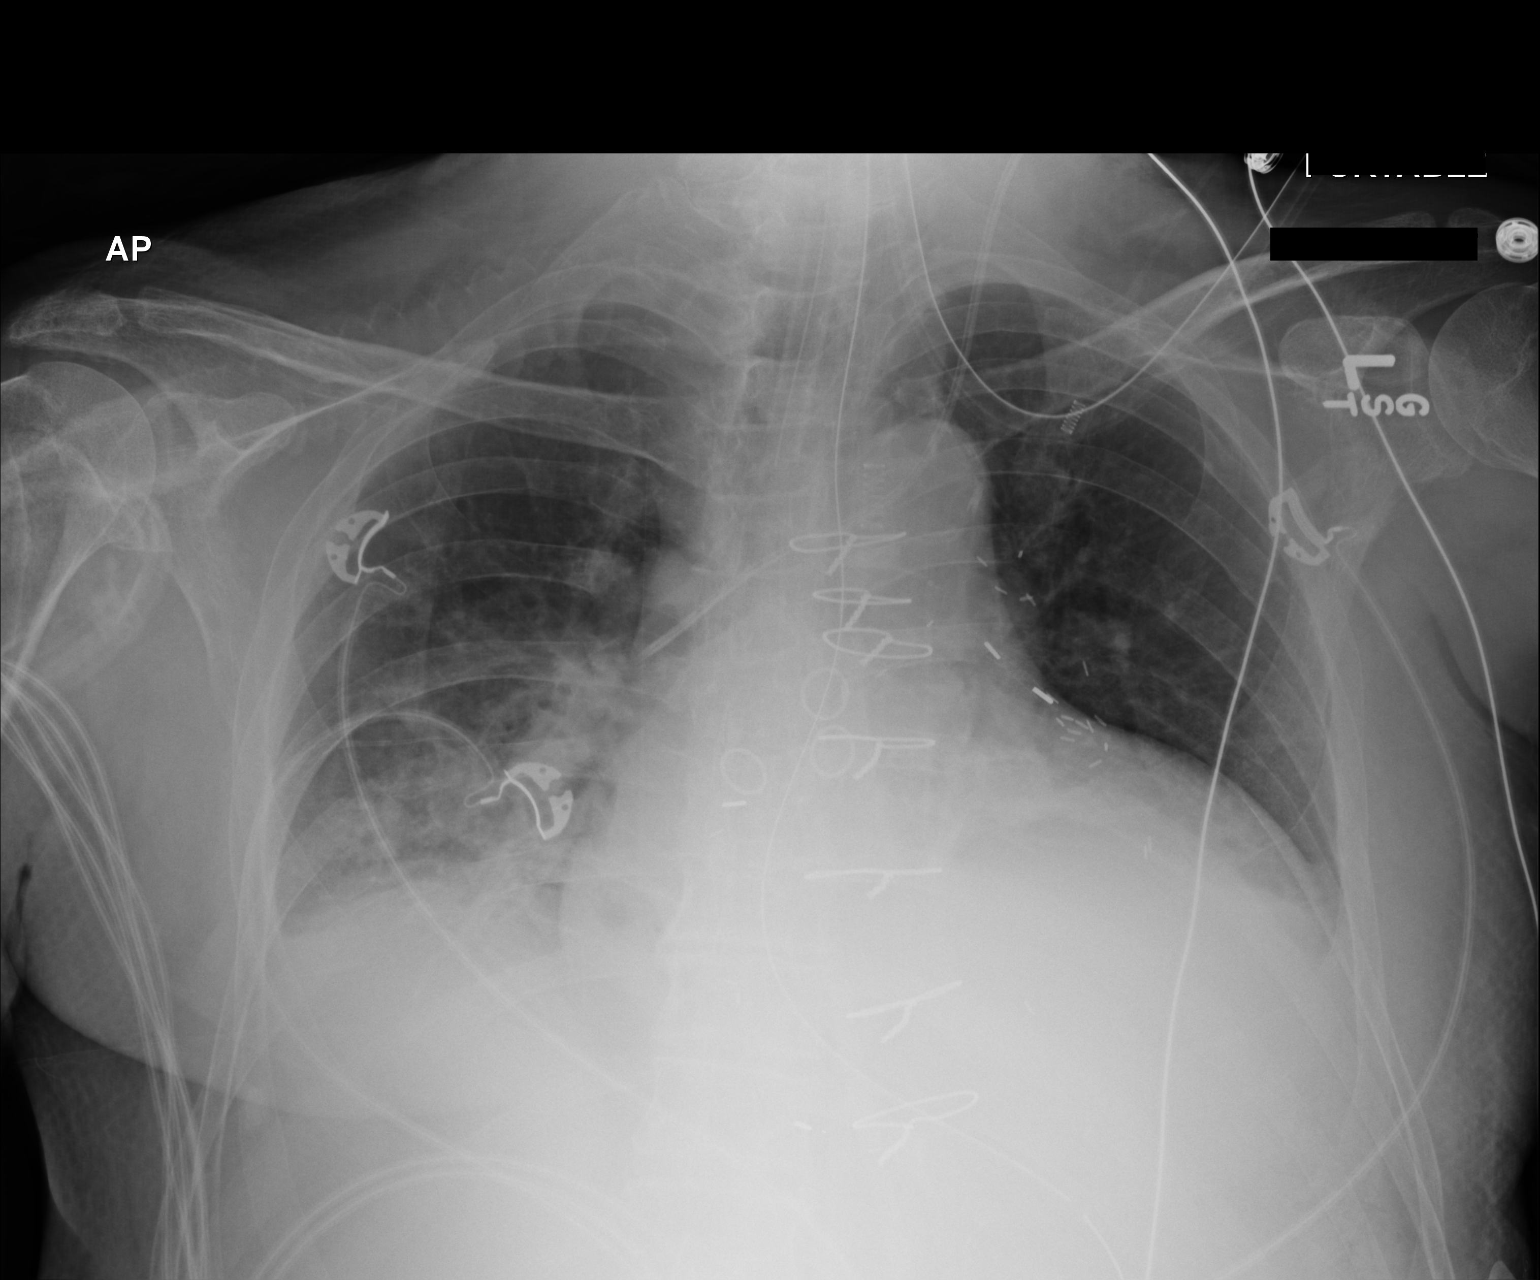

[1 of 1 positions shown; findings below may reference images not displayed]

FINDINGS: There is an endotracheal tube with the tip 3.8 cm above the carina.
There is a left jugular central venous catheter with the tip
projecting over the SVC. There is a nasogastric tube coursing below
the diaphragm.

There is a trace right pleural effusion. There is right lower lobe
airspace disease concerning for pneumonia. There is no pneumothorax.
There is stable cardiomegaly. There is evidence of prior CABG.

The osseous structures are unremarkable.
IMPRESSION: 1. Endotracheal tube with the tip 3.8 cm above the carina.
2. Right lower lobe airspace disease concerning for pneumonia.
